# Patient Record
Sex: Male | Born: 1935 | Race: White | Hispanic: No | Smoking: Former smoker
Health system: Southern US, Community
[De-identification: ages and names within clinical notes are randomized; demographics above are authoritative.]

## PROBLEM LIST (undated history)

## (undated) DIAGNOSIS — C801 Malignant (primary) neoplasm, unspecified: Secondary | ICD-10-CM

## (undated) DIAGNOSIS — I10 Essential (primary) hypertension: Secondary | ICD-10-CM

## (undated) DIAGNOSIS — E119 Type 2 diabetes mellitus without complications: Secondary | ICD-10-CM

## (undated) DIAGNOSIS — E079 Disorder of thyroid, unspecified: Secondary | ICD-10-CM

## (undated) DIAGNOSIS — I4891 Unspecified atrial fibrillation: Secondary | ICD-10-CM

## (undated) DIAGNOSIS — K922 Gastrointestinal hemorrhage, unspecified: Secondary | ICD-10-CM

## (undated) DIAGNOSIS — I2510 Atherosclerotic heart disease of native coronary artery without angina pectoris: Secondary | ICD-10-CM

## (undated) DIAGNOSIS — J8410 Pulmonary fibrosis, unspecified: Secondary | ICD-10-CM

## (undated) HISTORY — DX: Pulmonary fibrosis, unspecified: J84.10

## (undated) HISTORY — DX: Malignant (primary) neoplasm, unspecified: C80.1

---

## 2006-04-18 ENCOUNTER — Ambulatory Visit: Payer: Self-pay

## 2006-04-19 ENCOUNTER — Ambulatory Visit: Payer: Self-pay | Admitting: Radiology

## 2016-02-28 DIAGNOSIS — G4733 Obstructive sleep apnea (adult) (pediatric): Secondary | ICD-10-CM | POA: Insufficient documentation

## 2016-02-28 DIAGNOSIS — J449 Chronic obstructive pulmonary disease, unspecified: Secondary | ICD-10-CM | POA: Insufficient documentation

## 2016-05-21 DIAGNOSIS — J449 Chronic obstructive pulmonary disease, unspecified: Secondary | ICD-10-CM | POA: Insufficient documentation

## 2016-06-26 ENCOUNTER — Inpatient Hospital Stay
Admission: EM | Admit: 2016-06-26 | Discharge: 2016-07-05 | DRG: 871 | Disposition: A | Discharge status: Skilled Nursing Facility | Payer: Medicare Other | Attending: Internal Medicine | Admitting: Internal Medicine

## 2016-06-26 ENCOUNTER — Other Ambulatory Visit: Payer: Self-pay

## 2016-06-26 ENCOUNTER — Emergency Department: Payer: Medicare Other

## 2016-06-26 ENCOUNTER — Encounter: Payer: Self-pay | Admitting: Emergency Medicine

## 2016-06-26 DIAGNOSIS — K264 Chronic or unspecified duodenal ulcer with hemorrhage: Secondary | ICD-10-CM | POA: Diagnosis present

## 2016-06-26 DIAGNOSIS — Z515 Encounter for palliative care: Secondary | ICD-10-CM | POA: Diagnosis not present

## 2016-06-26 DIAGNOSIS — I482 Chronic atrial fibrillation: Secondary | ICD-10-CM | POA: Diagnosis present

## 2016-06-26 DIAGNOSIS — J90 Pleural effusion, not elsewhere classified: Secondary | ICD-10-CM

## 2016-06-26 DIAGNOSIS — J8410 Pulmonary fibrosis, unspecified: Secondary | ICD-10-CM | POA: Diagnosis not present

## 2016-06-26 DIAGNOSIS — Z79899 Other long term (current) drug therapy: Secondary | ICD-10-CM | POA: Diagnosis not present

## 2016-06-26 DIAGNOSIS — J189 Pneumonia, unspecified organism: Secondary | ICD-10-CM

## 2016-06-26 DIAGNOSIS — Z978 Presence of other specified devices: Secondary | ICD-10-CM

## 2016-06-26 DIAGNOSIS — R627 Adult failure to thrive: Secondary | ICD-10-CM | POA: Diagnosis present

## 2016-06-26 DIAGNOSIS — Z9889 Other specified postprocedural states: Secondary | ICD-10-CM

## 2016-06-26 DIAGNOSIS — I2510 Atherosclerotic heart disease of native coronary artery without angina pectoris: Secondary | ICD-10-CM | POA: Diagnosis present

## 2016-06-26 DIAGNOSIS — D62 Acute posthemorrhagic anemia: Secondary | ICD-10-CM | POA: Diagnosis not present

## 2016-06-26 DIAGNOSIS — J470 Bronchiectasis with acute lower respiratory infection: Secondary | ICD-10-CM | POA: Diagnosis present

## 2016-06-26 DIAGNOSIS — N183 Chronic kidney disease, stage 3 (moderate): Secondary | ICD-10-CM | POA: Diagnosis present

## 2016-06-26 DIAGNOSIS — I130 Hypertensive heart and chronic kidney disease with heart failure and stage 1 through stage 4 chronic kidney disease, or unspecified chronic kidney disease: Secondary | ICD-10-CM | POA: Diagnosis present

## 2016-06-26 DIAGNOSIS — R5383 Other fatigue: Secondary | ICD-10-CM

## 2016-06-26 DIAGNOSIS — N179 Acute kidney failure, unspecified: Secondary | ICD-10-CM | POA: Diagnosis present

## 2016-06-26 DIAGNOSIS — E039 Hypothyroidism, unspecified: Secondary | ICD-10-CM | POA: Diagnosis present

## 2016-06-26 DIAGNOSIS — N189 Chronic kidney disease, unspecified: Secondary | ICD-10-CM

## 2016-06-26 DIAGNOSIS — N390 Urinary tract infection, site not specified: Secondary | ICD-10-CM | POA: Diagnosis present

## 2016-06-26 DIAGNOSIS — A419 Sepsis, unspecified organism: Secondary | ICD-10-CM | POA: Diagnosis present

## 2016-06-26 DIAGNOSIS — J439 Emphysema, unspecified: Secondary | ICD-10-CM | POA: Diagnosis present

## 2016-06-26 DIAGNOSIS — M549 Dorsalgia, unspecified: Secondary | ICD-10-CM | POA: Diagnosis present

## 2016-06-26 DIAGNOSIS — Y95 Nosocomial condition: Secondary | ICD-10-CM | POA: Diagnosis present

## 2016-06-26 DIAGNOSIS — Z452 Encounter for adjustment and management of vascular access device: Secondary | ICD-10-CM

## 2016-06-26 DIAGNOSIS — Z794 Long term (current) use of insulin: Secondary | ICD-10-CM

## 2016-06-26 DIAGNOSIS — J84112 Idiopathic pulmonary fibrosis: Secondary | ICD-10-CM | POA: Diagnosis present

## 2016-06-26 DIAGNOSIS — R569 Unspecified convulsions: Secondary | ICD-10-CM

## 2016-06-26 DIAGNOSIS — K922 Gastrointestinal hemorrhage, unspecified: Secondary | ICD-10-CM | POA: Diagnosis not present

## 2016-06-26 DIAGNOSIS — J9690 Respiratory failure, unspecified, unspecified whether with hypoxia or hypercapnia: Secondary | ICD-10-CM

## 2016-06-26 DIAGNOSIS — R6520 Severe sepsis without septic shock: Secondary | ICD-10-CM | POA: Diagnosis not present

## 2016-06-26 DIAGNOSIS — K921 Melena: Secondary | ICD-10-CM | POA: Diagnosis not present

## 2016-06-26 DIAGNOSIS — I5030 Unspecified diastolic (congestive) heart failure: Secondary | ICD-10-CM | POA: Diagnosis present

## 2016-06-26 DIAGNOSIS — E1122 Type 2 diabetes mellitus with diabetic chronic kidney disease: Secondary | ICD-10-CM | POA: Diagnosis present

## 2016-06-26 DIAGNOSIS — I480 Paroxysmal atrial fibrillation: Secondary | ICD-10-CM | POA: Diagnosis present

## 2016-06-26 DIAGNOSIS — R531 Weakness: Secondary | ICD-10-CM

## 2016-06-26 DIAGNOSIS — J209 Acute bronchitis, unspecified: Secondary | ICD-10-CM | POA: Diagnosis present

## 2016-06-26 DIAGNOSIS — D72829 Elevated white blood cell count, unspecified: Secondary | ICD-10-CM

## 2016-06-26 DIAGNOSIS — G8929 Other chronic pain: Secondary | ICD-10-CM | POA: Diagnosis present

## 2016-06-26 DIAGNOSIS — R52 Pain, unspecified: Secondary | ICD-10-CM

## 2016-06-26 DIAGNOSIS — K2970 Gastritis, unspecified, without bleeding: Secondary | ICD-10-CM | POA: Diagnosis not present

## 2016-06-26 DIAGNOSIS — A09 Infectious gastroenteritis and colitis, unspecified: Secondary | ICD-10-CM | POA: Diagnosis present

## 2016-06-26 DIAGNOSIS — Z7982 Long term (current) use of aspirin: Secondary | ICD-10-CM

## 2016-06-26 DIAGNOSIS — Z7901 Long term (current) use of anticoagulants: Secondary | ICD-10-CM

## 2016-06-26 DIAGNOSIS — Z7189 Other specified counseling: Secondary | ICD-10-CM | POA: Diagnosis not present

## 2016-06-26 DIAGNOSIS — R197 Diarrhea, unspecified: Secondary | ICD-10-CM

## 2016-06-26 DIAGNOSIS — R918 Other nonspecific abnormal finding of lung field: Secondary | ICD-10-CM

## 2016-06-26 DIAGNOSIS — R571 Hypovolemic shock: Secondary | ICD-10-CM | POA: Diagnosis not present

## 2016-06-26 DIAGNOSIS — J9621 Acute and chronic respiratory failure with hypoxia: Secondary | ICD-10-CM | POA: Diagnosis present

## 2016-06-26 DIAGNOSIS — G4733 Obstructive sleep apnea (adult) (pediatric): Secondary | ICD-10-CM | POA: Diagnosis present

## 2016-06-26 HISTORY — DX: Gastrointestinal hemorrhage, unspecified: K92.2

## 2016-06-26 HISTORY — DX: Essential (primary) hypertension: I10

## 2016-06-26 HISTORY — DX: Disorder of thyroid, unspecified: E07.9

## 2016-06-26 HISTORY — DX: Unspecified atrial fibrillation: I48.91

## 2016-06-26 HISTORY — DX: Type 2 diabetes mellitus without complications: E11.9

## 2016-06-26 HISTORY — DX: Atherosclerotic heart disease of native coronary artery without angina pectoris: I25.10

## 2016-06-26 LAB — COMPREHENSIVE METABOLIC PANEL
ALK PHOS: 58 U/L (ref 38–126)
ALT: 31 U/L (ref 17–63)
AST: 51 U/L — AB (ref 15–41)
Albumin: 2.2 g/dL — ABNORMAL LOW (ref 3.5–5.0)
Anion gap: 13 (ref 5–15)
BUN: 37 mg/dL — AB (ref 6–20)
CALCIUM: 8 mg/dL — AB (ref 8.9–10.3)
CHLORIDE: 100 mmol/L — AB (ref 101–111)
CO2: 20 mmol/L — AB (ref 22–32)
CREATININE: 2.24 mg/dL — AB (ref 0.61–1.24)
GFR calc Af Amer: 29 mL/min — ABNORMAL LOW (ref >60)
GFR, EST NON AFRICAN AMERICAN: 25 mL/min — AB (ref >60)
Glucose, Bld: 173 mg/dL — ABNORMAL HIGH (ref 65–99)
Potassium: 4.6 mmol/L (ref 3.5–5.1)
SODIUM: 133 mmol/L — AB (ref 135–145)
Total Bilirubin: 0.7 mg/dL (ref 0.3–1.2)
Total Protein: 7.5 g/dL (ref 6.5–8.1)

## 2016-06-26 LAB — CBC WITH DIFFERENTIAL/PLATELET
BASOS ABS: 0.2 10*3/uL — AB (ref 0–0.1)
BASOS PCT: 1 %
Eosinophils Absolute: 0.1 10*3/uL (ref 0–0.7)
Eosinophils Relative: 0 %
HEMATOCRIT: 35.5 % — AB (ref 40.0–52.0)
HEMOGLOBIN: 11.7 g/dL — AB (ref 13.0–18.0)
LYMPHS PCT: 13 %
Lymphs Abs: 2.9 10*3/uL (ref 1.0–3.6)
MCH: 29.2 pg (ref 26.0–34.0)
MCHC: 32.9 g/dL (ref 32.0–36.0)
MCV: 88.7 fL (ref 80.0–100.0)
MONOS PCT: 9 %
Monocytes Absolute: 2 10*3/uL — ABNORMAL HIGH (ref 0.2–1.0)
NEUTROS ABS: 17.3 10*3/uL — AB (ref 1.4–6.5)
NEUTROS PCT: 77 %
Platelets: 275 10*3/uL (ref 150–440)
RBC: 4 MIL/uL — ABNORMAL LOW (ref 4.40–5.90)
RDW: 15.5 % — ABNORMAL HIGH (ref 11.5–14.5)
WBC: 22.5 10*3/uL — ABNORMAL HIGH (ref 3.8–10.6)

## 2016-06-26 LAB — INFLUENZA PANEL BY PCR (TYPE A & B)
Influenza A By PCR: NEGATIVE
Influenza B By PCR: NEGATIVE

## 2016-06-26 LAB — LACTIC ACID, PLASMA
Lactic Acid, Venous: 3.9 mmol/L (ref 0.5–1.9)
Lactic Acid, Venous: 2.9 mmol/L (ref 0.5–1.9)

## 2016-06-26 LAB — GLUCOSE, CAPILLARY: GLUCOSE-CAPILLARY: 166 mg/dL — AB (ref 65–99)

## 2016-06-26 MED ORDER — DEXTROSE 5 % IV SOLN
2.0000 g | INTRAVENOUS | Status: AC
  Administered 2016-06-26: 2 g via INTRAVENOUS
  Filled 2016-06-26: qty 2

## 2016-06-26 MED ORDER — CARVEDILOL 6.25 MG PO TABS
6.2500 mg | ORAL_TABLET | Freq: Two times a day (BID) | ORAL | Status: DC
  Administered 2016-06-27 – 2016-07-01 (×7): 6.25 mg via ORAL
  Filled 2016-06-26 (×8): qty 1

## 2016-06-26 MED ORDER — INSULIN ASPART 100 UNIT/ML ~~LOC~~ SOLN: 0.0000 [IU] | Freq: Every day | SUBCUTANEOUS | Status: DC

## 2016-06-26 MED ORDER — METRONIDAZOLE 500 MG PO TABS
500.0000 mg | ORAL_TABLET | Freq: Three times a day (TID) | ORAL | Status: DC
  Administered 2016-06-27 (×2): 500 mg via ORAL
  Filled 2016-06-26 (×3): qty 1

## 2016-06-26 MED ORDER — HEPARIN SODIUM (PORCINE) 5000 UNIT/ML IJ SOLN
5000.0000 [IU] | Freq: Three times a day (TID) | INTRAMUSCULAR | Status: DC
  Administered 2016-06-27 (×2): 5000 [IU] via SUBCUTANEOUS
  Filled 2016-06-26 (×2): qty 1

## 2016-06-26 MED ORDER — INSULIN ASPART 100 UNIT/ML ~~LOC~~ SOLN
8.0000 [IU] | Freq: Three times a day (TID) | SUBCUTANEOUS | Status: DC
  Administered 2016-06-28 (×2): 8 [IU] via SUBCUTANEOUS
  Filled 2016-06-26 (×2): qty 8

## 2016-06-26 MED ORDER — ONDANSETRON HCL 4 MG PO TABS: 4.0000 mg | ORAL_TABLET | Freq: Four times a day (QID) | ORAL | Status: DC | PRN

## 2016-06-26 MED ORDER — VANCOMYCIN HCL IN DEXTROSE 1-5 GM/200ML-% IV SOLN
1000.0000 mg | Freq: Once | INTRAVENOUS | Status: DC
  Filled 2016-06-26: qty 200

## 2016-06-26 MED ORDER — MONTELUKAST SODIUM 10 MG PO TABS
10.0000 mg | ORAL_TABLET | Freq: Every day | ORAL | Status: DC
  Administered 2016-06-27 – 2016-06-28 (×2): 10 mg via ORAL
  Filled 2016-06-26 (×3): qty 1

## 2016-06-26 MED ORDER — LEVOTHYROXINE SODIUM 75 MCG PO TABS
75.0000 ug | ORAL_TABLET | Freq: Every day | ORAL | Status: DC
  Administered 2016-06-27 – 2016-07-05 (×9): 75 ug via ORAL
  Filled 2016-06-26 (×9): qty 1

## 2016-06-26 MED ORDER — CEFEPIME-DEXTROSE 1 GM/50ML IV SOLR: 1.0000 g | INTRAVENOUS | Status: DC

## 2016-06-26 MED ORDER — FINASTERIDE 5 MG PO TABS
5.0000 mg | ORAL_TABLET | Freq: Every day | ORAL | Status: DC
  Administered 2016-06-27 – 2016-07-05 (×9): 5 mg via ORAL
  Filled 2016-06-26 (×9): qty 1

## 2016-06-26 MED ORDER — ACETAMINOPHEN 325 MG PO TABS: 650.0000 mg | ORAL_TABLET | Freq: Four times a day (QID) | ORAL | Status: DC | PRN

## 2016-06-26 MED ORDER — SODIUM CHLORIDE 0.9 % IV SOLN
1500.0000 mg | INTRAVENOUS | Status: AC
  Administered 2016-06-26: 1500 mg via INTRAVENOUS
  Filled 2016-06-26 (×2): qty 1500

## 2016-06-26 MED ORDER — DEXTROSE 5 % IV SOLN
2.0000 g | Freq: Once | INTRAVENOUS | Status: DC
  Filled 2016-06-26: qty 2

## 2016-06-26 MED ORDER — ALBUTEROL SULFATE (2.5 MG/3ML) 0.083% IN NEBU: 2.5000 mg | INHALATION_SOLUTION | RESPIRATORY_TRACT | Status: DC | PRN

## 2016-06-26 MED ORDER — POLYETHYLENE GLYCOL 3350 17 G PO PACK: 17.0000 g | PACK | Freq: Every day | ORAL | Status: DC | PRN

## 2016-06-26 MED ORDER — SODIUM CHLORIDE 0.9 % IV BOLUS (SEPSIS)
1000.0000 mL | Freq: Once | INTRAVENOUS | Status: AC
  Administered 2016-06-26: 1000 mL via INTRAVENOUS

## 2016-06-26 MED ORDER — INSULIN ASPART 100 UNIT/ML ~~LOC~~ SOLN
0.0000 [IU] | Freq: Three times a day (TID) | SUBCUTANEOUS | Status: DC
  Administered 2016-06-27: 2 [IU] via SUBCUTANEOUS
  Administered 2016-06-28 – 2016-06-29 (×2): 3 [IU] via SUBCUTANEOUS
  Filled 2016-06-26 (×2): qty 3
  Filled 2016-06-26: qty 2

## 2016-06-26 MED ORDER — HYDROCODONE-ACETAMINOPHEN 5-325 MG PO TABS
1.0000 | ORAL_TABLET | ORAL | Status: DC | PRN
  Administered 2016-07-03 – 2016-07-05 (×4): 1 via ORAL
  Administered 2016-07-05: 2 via ORAL
  Administered 2016-07-05: 1 via ORAL
  Filled 2016-06-26 (×2): qty 1
  Filled 2016-06-26: qty 2
  Filled 2016-06-26 (×4): qty 1

## 2016-06-26 MED ORDER — ONDANSETRON HCL 4 MG/2ML IJ SOLN: 4.0000 mg | Freq: Four times a day (QID) | INTRAMUSCULAR | Status: DC | PRN

## 2016-06-26 MED ORDER — SODIUM CHLORIDE 0.9 % IV BOLUS (SEPSIS)
2500.0000 mL | Freq: Once | INTRAVENOUS | Status: AC
  Administered 2016-06-26: 2500 mL via INTRAVENOUS

## 2016-06-26 MED ORDER — ASPIRIN EC 81 MG PO TBEC
81.0000 mg | DELAYED_RELEASE_TABLET | Freq: Every day | ORAL | Status: DC
  Administered 2016-06-27 – 2016-06-28 (×2): 81 mg via ORAL
  Filled 2016-06-26 (×2): qty 1

## 2016-06-26 MED ORDER — SODIUM CHLORIDE 0.9 % IV SOLN
INTRAVENOUS | Status: AC
  Administered 2016-06-27: 01:00:00 via INTRAVENOUS

## 2016-06-26 MED ORDER — INSULIN LISPRO 100 UNIT/ML (KWIKPEN): 8.0000 [IU] | PEN_INJECTOR | Freq: Three times a day (TID) | SUBCUTANEOUS | Status: DC

## 2016-06-26 MED ORDER — SODIUM CHLORIDE 0.9% FLUSH
3.0000 mL | Freq: Two times a day (BID) | INTRAVENOUS | Status: DC
  Administered 2016-06-27 – 2016-07-04 (×17): 3 mL via INTRAVENOUS

## 2016-06-26 MED ORDER — GUAIFENESIN ER 600 MG PO TB12
1200.0000 mg | ORAL_TABLET | Freq: Two times a day (BID) | ORAL | Status: DC
  Administered 2016-06-27 – 2016-06-28 (×5): 1200 mg via ORAL
  Filled 2016-06-26 (×6): qty 2

## 2016-06-26 MED ORDER — ACETAMINOPHEN 650 MG RE SUPP: 650.0000 mg | Freq: Four times a day (QID) | RECTAL | Status: DC | PRN

## 2016-06-26 MED ORDER — ROSUVASTATIN CALCIUM 20 MG PO TABS
20.0000 mg | ORAL_TABLET | Freq: Every day | ORAL | Status: DC
  Administered 2016-06-27 – 2016-07-05 (×9): 20 mg via ORAL
  Filled 2016-06-26 (×9): qty 1

## 2016-06-26 NOTE — Progress Notes (Signed)
ANTIBIOTIC CONSULT NOTE - INITIAL  Pharmacy Consult for Vancomycin and Cefepime  Indication: Pneumonia   No Known Allergies  Patient Measurements: Height: 5\' 9"  (175.3 cm) Weight: 234 lb (106.1 kg) IBW/kg (Calculated) : 70.7 Adjusted Body Weight:   Vital Signs: Temp: 98.4 F (36.9 C) (03/05 1845) Temp Source: Oral (03/05 1845) BP: 110/99 (03/05 1845) Pulse Rate: 120 (03/05 1845) Intake/Output from previous day: No intake/output data recorded. Intake/Output from this shift: No intake/output data recorded.  Labs:  Recent Labs  06/26/16 1846  WBC 22.5*  HGB 11.7*  PLT 275  CREATININE 2.24*   Estimated Creatinine Clearance: 30 mL/min (by C-G formula based on SCr of 2.24 mg/dL (H)). No results for input(s): VANCOTROUGH, VANCOPEAK, VANCORANDOM, GENTTROUGH, GENTPEAK, GENTRANDOM, TOBRATROUGH, TOBRAPEAK, TOBRARND, AMIKACINPEAK, AMIKACINTROU, AMIKACIN in the last 72 hours.   Microbiology: No results found for this or any previous visit (from the past 720 hour(s)).  Medical History: Past Medical History:  Diagnosis Date  . Atrial fibrillation ([place])   . CAD (coronary artery disease)   . Diabetes mellitus without complication ([place])   . Hypertension   . Thyroid disease     Medications:   (Not in a hospital admission) Scheduled:   Assessment: Pharmacy consulted to dose and monitor Vancomycin and Cefepime in this 83 year old male. Currently patient's serum creatinine is 2.24 mg/dl. Last known serum creatinine was 1.5 on 1/31 @ Outside facility; therefore patient is in AKI by definition.     Goal of Therapy:  Vancomycin trough level 15-20 mcg/ml  Plan:  Will give Vancomycin 1500 mg IV x 1 and will then dose per levels until AKI resolved. Vancomycin random level ordered to be drawn @ 20:00 on 3/6 unless serum creatinine improves. Serum creatinine order placed to be drawn with am labs  Cefepime: Will give patient Cefepime 2 g IV x 1 then start Cefepime 1 g IV q24 hours  (dosing as if Crcl <49ml/min since patient is in AKI).    Darcell Sabino D 06/26/2016,7:50 PM

## 2016-06-26 NOTE — ED Triage Notes (Signed)
Patient from home via [facility]. Patient reports weakness x 3 days. Also reports diarrhea for several days. Patient states he fell in bathroom today and could not get up. Denies LOC. Denies fever at home. Denies pain. A&Ox4

## 2016-06-26 NOTE — ED Provider Notes (Signed)
[facility] Emergency Department Provider Note  ____________________________________________  Time seen: Approximately 6:54 PM  I have reviewed the triage vital signs and the nursing notes.   HISTORY  Chief Complaint Weakness and Diarrhea    HPI Glenn Reeves is a 83 y.o. male who complains of generalized weakness for 3 days. Also had diarrhea for 2 days. Today when he is using the bathroom he fell to the floor and could not get himself back up. He denies head injury or loss of consciousness. Vomiting as well. Denies pain anywhere present time.     Past Medical History:  Diagnosis Date  . Atrial fibrillation ([place])   . CAD (coronary artery disease)   . Diabetes mellitus without complication ([place])   . Hypertension   . Thyroid disease      There are no active problems to display for this patient.    History reviewed. No pertinent surgical history.   Prior to Admission medications   Medication Sig Start Date End Date Taking? Authorizing Provider  amoxicillin-clavulanate (AUGMENTIN) 875-125 MG tablet Take 1 tablet by mouth 2 (two) times daily. 06/26/16  Yes Historical Provider, MD  aspirin EC 81 MG tablet Take 1 tablet by mouth daily.   Yes Historical Provider, MD  carvedilol (COREG) 6.25 MG tablet Take 6.25 mg by mouth 2 (two) times daily. 06/13/16  Yes Historical Provider, MD  ELIQUIS 5 MG TABS tablet Take 0.5 tablets by mouth 2 (two) times daily.  06/07/16  Yes Historical Provider, MD  finasteride (PROSCAR) 5 MG tablet Take 5 mg by mouth daily. 06/07/16  Yes Historical Provider, MD  glipiZIDE (GLUCOTROL XL) 10 MG 24 hr tablet Take 10 mg by mouth daily with breakfast.   Yes Historical Provider, MD  guaiFENesin (MUCINEX) 600 MG 12 hr tablet Take 2 tablets by mouth 2 (two) times daily. 05/24/16  Yes Historical Provider, MD  insulin detemir (LEVEMIR) 100 UNIT/ML injection Inject into the skin 3 (three) times daily. Sliding scale   Yes Historical  Provider, MD  ipratropium-albuterol (DUONEB) 0.5-2.5 (3) MG/3ML SOLN Inhale 3 mLs into the lungs 2 (two) times daily.  05/25/16  Yes Historical Provider, MD  levothyroxine (SYNTHROID, LEVOTHROID) 75 MCG tablet Take 75 mcg by mouth daily. 06/07/16  Yes Historical Provider, MD  montelukast (SINGULAIR) 10 MG tablet Take 1 tablet by mouth daily. 06/07/16  Yes Historical Provider, MD  rosuvastatin (CRESTOR) 20 MG tablet Take 20 mg by mouth daily. 06/13/16  Yes Historical Provider, MD  glipiZIDE (GLUCOTROL) 5 MG tablet Take 5 mg by mouth 2 (two) times daily. 06/07/16   Historical Provider, MD  HUMALOG KWIKPEN 100 UNIT/ML KiwkPen Take 12 Units by mouth 3 (three) times daily. 06/07/16   Historical Provider, MD  lisinopril-hydrochlorothiazide (PRINZIDE,ZESTORETIC) 20-12.5 MG tablet Take 1 tablet by mouth daily.    Historical Provider, MD  polycarbophil (FIBERCON) 625 MG tablet Take 625 mg by mouth daily as needed.    Historical Provider, MD   misc med  Indications: Obstructive sleep apnea Med Name: BIPAP MASK AND SUPPLIES 1 each  0 02/23/2016  Active  Misc. Devices misc  BiPAP BiPAP Machine , 22/16 CM WATER PRESSURE at bedtime # 1 ,09/17/2008 Active Quantity: 0; Refills: 0   Conversion, PulmonarySleep ; Started 17-Sep-2008 Active   09/17/2008  Active  Misc. Devices misc  CPAP CPAP Mask , 1 (one) MASK at bedtime # 1 ,09/17/2008 Active Quantity: 0; Refills: 0   Conversion, PulmonarySleep ; Started 17-Sep-2008 Active   09/17/2008  Active  Misc. Devices misc  CPAP 30 Day Download before next follow up Quantity: 1; Refills: 0   Raval, Abhijit M.D.; Started 20-Jan-2015 Active   01/20/2015  Active  Misc. Devices misc  CPAP/BiPAP Tubing and Supplies 1 unit Quantity: 1; Refills: 3   Vela Prose M.D.; Started 31-October-2012 Active   10/31/2012  Active  apixaban (ELIQUIS) 12m tablet  Take 5 mg by mouth every morning.    02/23/2015  Active  Respiratory Therapy Supplies  (NEBULIZER/TUBING/MOUTHPIECE) kit  Nebulizer/Tubing/Mouthpiece KIT USE AS DIRECTED. Quantity: 1; Refills: 0   TVela ProseM.D.; Started 31-October-2012 Active   10/31/2012  Active  BAYER BREEZE 2 TEST disk  TEST 3 TIMES DAILY BEFORE MEALS  5 01/03/2016  Active  montelukast (generic for SINGULAIR) 153mtablet  Take 10 mg by mouth every morning.    02/11/2016  Active  misc med  Med Name:  cpap download 2 each  1 02/28/2016  Active  aspirin 8169mC tablet  Take 81 mg by mouth every morning.     Active  carvedilol (generic for COREG) 6.6m40mblet  Take 6.25 mg by mouth 2 (two) times a day with meals.     Active  finasteride (generic for PROSCAR) 5mg 58mlet  Take 5 mg by mouth every morning.     Active  glipiZIDE (generic for GLUCOTROL XL) 10mg 38m tablet  Take 5 mg by mouth 2 (two) times a day.     Active  insulin detemir (generic for LEVEMIR) 100unit/mL injection  Inject 45 units under the skin every morning.     Active  insulin lispro (generic for HumaLOG) 100unit/mL injection  Inject 12 units under the skin 3 (three) times daily after meals.     Active  levothyroxine (generic for SYNTHROID, LEVOTHROID) 75mcg 12met  Take 75 mcg by mouth before breakfast.     Active  lisinopril-hydrochlorothiazide (generic for PRINZIDE,ZESTORETIC) 20-12.5mg per58mblet  Take 1 tablet by mouth every morning.     Active  polycarbophil (FIBERCON) 66mg tab92m Take 625 mg by mouth daily as needed for constipation.     Active  rosuvastatin (CRESTOR) 20mg tabl33mTake 20 mg by mouth daily with dinner.     Active  guaiFENesin (MUCINEX) 600mg 12 hr69mlet  Take 2 tablets (1,200 mg total) by mouth 2 (two) times a day. 28 tablet  0 05/24/2016  Active  ipratropium-albuterol (generic for DUO-NEB) 0.5-2.5mg/3mL neb48mzer solution  Take 3 mL by nebulization every 4 (four) hours as needed for wheezing. Dx code CM 496 and CMJ 44-9 180 mL  11 05/25/2016   Active      Allergies Patient has no known allergies.   No family history on file.  Social History Social History  Substance Use Topics  . Smoking status: Never Smoker  . Smokeless tobacco: Never Used  . Alcohol use No    Review of Systems  Constitutional:   No fever or chills.  ENT:   No sore throat. No rhinorrhea. Cardiovascular:   No chest pain. Respiratory:   No dyspnea or cough. Gastrointestinal:   Negative for abdominal pain,Positive vomiting and diarrhea.  Genitourinary:   Negative for dysuria or difficulty urinating. Musculoskeletal:   Negative for focal pain or swelling Neurological:   Negative for headaches 10-point ROS otherwise negative.  ____________________________________________   PHYSICAL EXAM:  VITAL SIGNS: ED Triage Vitals  Enc Vitals Group     BP 06/26/16 1845 (!) 110/99     Pulse Rate 06/26/16 1845 (!) 120  Resp 06/26/16 1845 (!) 30     Temp 06/26/16 1845 98.4 F (36.9 C)     Temp Source 06/26/16 1845 Oral     SpO2 06/26/16 1845 96 %     Weight 06/26/16 1846 234 lb (106.1 kg)     Height 06/26/16 1846 _0  (1.753 m)     Head Circumference --      Peak Flow --      Pain Score --      Pain Loc --      Pain Edu? --      Excl. in [place]? --     Vital signs reviewed, nursing assessments reviewed.   Constitutional:   Alert and oriented. Not in distress. Eyes:   No scleral icterus. No conjunctival pallor. PERRL. EOMI.  No nystagmus. ENT   Head:   Normocephalic with a few scattered small abrasions over the scalp.   Nose:   No congestion/rhinnorhea. No septal hematoma   Mouth/Throat:   No mucous membranes, no pharyngeal erythema. No peritonsillar mass.    Neck:   No stridor. No SubQ emphysema. No meningismus. Hematological/Lymphatic/Immunilogical:   No cervical lymphadenopathy. Cardiovascular:   Irregularly irregular rhythm, tachycardic heart rate 120. Symmetric bilateral radial and DP pulses.  No murmurs.  Respiratory:    Normal respiratory effort without tachypnea nor retractions. Sounds symmetric. Bibasilar crackles. Gastrointestinal:   Soft and nontender. Non distended. There is no CVA tenderness.  No rebound, rigidity, or guarding. Genitourinary:   deferred Musculoskeletal:   Normal range of motion in all extremities. No joint effusions.  No lower extremity tenderness.  No edema. Neurologic:   Normal speech and language.  CN 2-10 normal. Motor grossly intact. No gross focal neurologic deficits are appreciated.  Skin:    Skin is warm, dry and intact. No rash noted.  No petechiae, purpura, or bullae.  ____________________________________________    LABS (pertinent positives/negatives) (all labs ordered are listed, but only abnormal results are displayed) Labs Reviewed  COMPREHENSIVE METABOLIC PANEL - Abnormal; Notable for the following:       Result Value   Sodium 133 (*)    Chloride 100 (*)    CO2 20 (*)    Glucose, Bld 173 (*)    BUN 37 (*)    Creatinine, Ser 2.24 (*)    Calcium 8.0 (*)    Albumin 2.2 (*)    AST 51 (*)    GFR calc non Af Amer 25 (*)    GFR calc Af Amer 29 (*)    All other components within normal limits  CBC WITH DIFFERENTIAL/PLATELET - Abnormal; Notable for the following:    WBC 22.5 (*)    RBC 4.00 (*)    Hemoglobin 11.7 (*)    HCT 35.5 (*)    RDW 15.5 (*)    Neutro Abs 17.3 (*)    Monocytes Absolute 2.0 (*)    Basophils Absolute 0.2 (*)    All other components within normal limits  URINE CULTURE  C DIFFICILE QUICK SCREEN W PCR REFLEX  CULTURE, BLOOD (ROUTINE X 2)  CULTURE, BLOOD (ROUTINE X 2)  URINALYSIS, COMPLETE (UACMP) WITH MICROSCOPIC  INFLUENZA PANEL BY PCR (TYPE A & B)  LACTIC ACID, PLASMA  LACTIC ACID, PLASMA  CREATININE, SERUM   ____________________________________________   EKG  Troponin by me Atrial fibrillation rate of 110, right axis. Normal intervals. Right bundle-branch block. No acute ischemic  changes.  ____________________________________________    RADIOLOGY  Dg Chest 2 View  Result Date:  06/26/2016 CLINICAL DATA:  Weakness for 3 days EXAM: CHEST  2 VIEW COMPARISON:  04/18/2006 FINDINGS: Fibrotic changes at the left greater than right lung bases. Tiny pleural effusions or thickening. Increased opacity at the left greater than right lung base suspicious for acute inflammatory process superimposed on chronic change. Mild cardiomegaly with atherosclerosis. No pneumothorax. Surgical clips at the left neck IMPRESSION: 1. Reticular opacities within the left greater than right bases suggestive of fibrosis. More confluent opacification at the left greater than right lung base is suspicious for superimposed acute inflammation or pneumonia. Small left pleural effusion or thickening 2. Cardiomegaly. Electronically Signed   By: Donavan Foil M.D.   On: 06/26/2016 19:10   Ct Head Wo Contrast  Result Date: 06/26/2016 CLINICAL DATA:  Weakness, status post fall, abrasions EXAM: CT HEAD WITHOUT CONTRAST TECHNIQUE: Contiguous axial images were obtained from the base of the skull through the vertex without intravenous contrast. COMPARISON:  None. FINDINGS: Patient motion degrades image quality limiting evaluation. Brain: No evidence of acute infarction, hemorrhage, extra-axial collection, ventriculomegaly, or mass effect. Generalized cerebral atrophy. Periventricular white matter low attenuation likely secondary to microangiopathy. Vascular: Cerebrovascular atherosclerotic calcifications are noted. Skull: Negative for fracture or focal lesion. Sinuses/Orbits: Visualized portions of the orbits are unremarkable. Visualized portions of the paranasal sinuses and mastoid air cells are unremarkable. Other: None. IMPRESSION: No acute intracranial pathology. Electronically Signed   By: Kathreen Devoid   On: 06/26/2016 19:27    ____________________________________________   PROCEDURES Procedures CRITICAL  CARE Performed by: Carrie Mew   Total critical care time: 35 minutes  Critical care time was exclusive of separately billable procedures and treating other patients.  Critical care was necessary to treat or prevent imminent or life-threatening deterioration.  Critical care was time spent personally by me on the following activities: development of treatment plan with patient and/or surrogate as well as nursing, discussions with consultants, evaluation of patient's response to treatment, examination of patient, obtaining history from patient or surrogate, ordering and performing treatments and interventions, ordering and review of laboratory studies, ordering and review of radiographic studies, pulse oximetry and re-evaluation of patient's condition.  ____________________________________________   INITIAL IMPRESSION / ASSESSMENT AND PLAN / ED COURSE  Pertinent labs & imaging results that were available during my care of the patient were reviewed by me and considered in my medical decision making (see chart for details).  She presents with generalized weakness vomiting diarrhea. Possible minor head trauma from the fall. Check labs flu chest x-ray CT head. Lids.     Clinical Course as of Jun 26 2021  Mon Jun 26, 2016  7673 High wbc. Acute on chronic renal insufficiency. Cont. Ivf. Check c dif. Plan to admit. Start tx for HCAP as well in case the rest of the workup is negative given infiltrate on cxr.   [PS]  1932 Most recent bp ~80/60. With wbc 22, worsened creat, cxr, will initiate code sepsis. Check lactate, blood culture, cont fluid boluses.   [PS]    Clinical Course User Index [PS] Carrie Mew, MD     ----------------------------------------- 8:21 PM on 06/26/2016 -----------------------------------------  Discussed with family who again no patient was recently treated for pneumonia as well as urinary tract infection, seemed to be getting better and then within the  last week worsened with similar symptoms that he previously presented with for pneumonia. Consistent with current working diagnosis of HCAP. Awaiting influenza and C. difficile test. Plan to admit. Continue fluid boluses and antibiotics.  ____________________________________________  FINAL CLINICAL IMPRESSION(S) / ED DIAGNOSES  Final diagnoses:  Seizure ([place])  Sepsis, due to unspecified organism ([place])  HCAP (healthcare-associated pneumonia)  Diarrhea of presumed infectious origin  Generalized weakness  Acute renal failure superimposed on chronic kidney disease, unspecified CKD stage, unspecified acute renal failure type [facility])      New Prescriptions   No medications on file     Portions of this note were generated with dragon dictation software. Dictation errors may occur despite best attempts at proofreading.    Carrie Mew, MD 06/26/16 2023

## 2016-06-26 NOTE — H&P (Signed)
[facility] NAME: Glenn Reeves    MR#:  [id number]  DATE OF BIRTH:  [birth year]  DATE OF ADMISSION:  06/26/2016  PRIMARY CARE PHYSICIAN: Pcp Not In System   REQUESTING/REFERRING PHYSICIAN: Dr. Joni Fears  CHIEF COMPLAINT:   Chief Complaint  Patient presents with  . Weakness  . Diarrhea    HISTORY OF PRESENT ILLNESS:  Glenn Reeves  is a 83 y.o. male with a known history of CAD, atrial fibrillation, hypertension, CKD stage III who was recently treated in [facility] for pneumonia presents to the emergency room due to weakness, fall and diarrhea. Patient has been found to have bibasilar pneumonia, leukocytosis, tachycardia with elevated lactic acid of 3.9. He also was treated recently for UTI. He has chronic shortness of breath which is similar. He does have sputum which is clear. Feels lightheaded and dizzy. Poor appetite. Has chronic back pain which is unchanged. Afebrile.  PAST MEDICAL HISTORY:   Past Medical History:  Diagnosis Date  . Atrial fibrillation ([place])   . CAD (coronary artery disease)   . Diabetes mellitus without complication ([place])   . GI bleed   . Hypertension   . Thyroid disease     PAST SURGICAL HISTORY:  History reviewed. No pertinent surgical history.  SOCIAL HISTORY:   Social History  Substance Use Topics  . Smoking status: Never Smoker  . Smokeless tobacco: Never Used  . Alcohol use No    FAMILY HISTORY:   Family History  Problem Relation Age of Onset  . Autoimmune disease Neg Hx     DRUG ALLERGIES:  No Known Allergies  REVIEW OF SYSTEMS:   Review of Systems  Constitutional: Positive for chills and malaise/fatigue. Negative for fever.  HENT: Negative for sore throat.   Eyes: Negative for blurred vision, double vision and pain.  Respiratory: Positive for cough, sputum production and shortness of breath. Negative for hemoptysis and wheezing.   Cardiovascular: Positive for  palpitations. Negative for chest pain, orthopnea and leg swelling.  Gastrointestinal: Positive for diarrhea. Negative for abdominal pain, constipation, heartburn, nausea and vomiting.  Genitourinary: Negative for dysuria and hematuria.  Musculoskeletal: Negative for back pain and joint pain.  Skin: Negative for rash.  Neurological: Positive for weakness. Negative for sensory change, speech change, focal weakness and headaches.  Endo/Heme/Allergies: Does not bruise/bleed easily.  Psychiatric/Behavioral: Negative for depression. The patient is not nervous/anxious.     MEDICATIONS AT HOME:   Prior to Admission medications   Medication Sig Start Date End Date Taking? Authorizing Provider  amoxicillin-clavulanate (AUGMENTIN) 875-125 MG tablet Take 1 tablet by mouth 2 (two) times daily. 06/26/16  Yes Historical Provider, MD  aspirin EC 81 MG tablet Take 1 tablet by mouth daily.   Yes Historical Provider, MD  carvedilol (COREG) 6.25 MG tablet Take 6.25 mg by mouth 2 (two) times daily. 06/13/16  Yes Historical Provider, MD  ELIQUIS 5 MG TABS tablet Take 0.5 tablets by mouth 2 (two) times daily.  06/07/16  Yes Historical Provider, MD  finasteride (PROSCAR) 5 MG tablet Take 5 mg by mouth daily. 06/07/16  Yes Historical Provider, MD  glipiZIDE (GLUCOTROL XL) 10 MG 24 hr tablet Take 10 mg by mouth daily with breakfast.   Yes Historical Provider, MD  guaiFENesin (MUCINEX) 600 MG 12 hr tablet Take 2 tablets by mouth 2 (two) times daily. 05/24/16  Yes Historical Provider, MD  insulin detemir (LEVEMIR) 100 UNIT/ML injection Inject into the skin 3 (three)  times daily. Sliding scale   Yes Historical Provider, MD  ipratropium-albuterol (DUONEB) 0.5-2.5 (3) MG/3ML SOLN Inhale 3 mLs into the lungs 2 (two) times daily.  05/25/16  Yes Historical Provider, MD  levothyroxine (SYNTHROID, LEVOTHROID) 75 MCG tablet Take 75 mcg by mouth daily. 06/07/16  Yes Historical Provider, MD  montelukast (SINGULAIR) 10 MG tablet Take 1  tablet by mouth daily. 06/07/16  Yes Historical Provider, MD  rosuvastatin (CRESTOR) 20 MG tablet Take 20 mg by mouth daily. 06/13/16  Yes Historical Provider, MD  glipiZIDE (GLUCOTROL) 5 MG tablet Take 5 mg by mouth 2 (two) times daily. 06/07/16   Historical Provider, MD  HUMALOG KWIKPEN 100 UNIT/ML KiwkPen Take 12 Units by mouth 3 (three) times daily. 06/07/16   Historical Provider, MD  lisinopril-hydrochlorothiazide (PRINZIDE,ZESTORETIC) 20-12.5 MG tablet Take 1 tablet by mouth daily.    Historical Provider, MD  polycarbophil (FIBERCON) 625 MG tablet Take 625 mg by mouth daily as needed.    Historical Provider, MD     VITAL SIGNS:  Blood pressure 113/90, pulse (!) 119, temperature 98.4 F (36.9 C), temperature source Oral, resp. rate (!) 23, height 5\' 9"  (1.753 m), weight 106.1 kg (234 lb), SpO2 94 %.  PHYSICAL EXAMINATION:  Physical Exam  GENERAL:  83 y.o.-year-old patient. Sitting in a chair EYES: Pupils equal, round, reactive to light and accommodation. No scleral icterus. Extraocular muscles intact.  HEENT: Head atraumatic, normocephalic. Oropharynx and nasopharynx clear. No oropharyngeal erythema, moist oral mucosa  NECK:  Supple, no jugular venous distention. No thyroid enlargement, no tenderness.  LUNGS: Increased work of breathing. No crackles or wheezing. CARDIOVASCULAR: Irregularly irregular. Tachycardia. ABDOMEN: Soft, nontender, nondistended. Bowel sounds present. No organomegaly or mass.  EXTREMITIES: No pedal edema, cyanosis, or clubbing. + 2 pedal & radial pulses b/l.   NEUROLOGIC: Cranial nerves II through XII are intact. No focal Motor or sensory deficits appreciated b/l PSYCHIATRIC: The patient is alert and oriented x 3. Good affect.  SKIN: No obvious rash, lesion, or ulcer.   LABORATORY PANEL:   CBC  Recent Labs Lab 06/26/16 1846  WBC 22.5*  HGB 11.7*  HCT 35.5*  PLT 275    ------------------------------------------------------------------------------------------------------------------  Chemistries   Recent Labs Lab 06/26/16 1846  NA 133*  K 4.6  CL 100*  CO2 20*  GLUCOSE 173*  BUN 37*  CREATININE 2.24*  CALCIUM 8.0*  AST 51*  ALT 31  ALKPHOS 58  BILITOT 0.7   ------------------------------------------------------------------------------------------------------------------  Cardiac Enzymes No results for input(s): TROPONINI in the last 168 hours. ------------------------------------------------------------------------------------------------------------------  RADIOLOGY:  Dg Chest 2 View  Result Date: 06/26/2016 CLINICAL DATA:  Weakness for 3 days EXAM: CHEST  2 VIEW COMPARISON:  04/18/2006 FINDINGS: Fibrotic changes at the left greater than right lung bases. Tiny pleural effusions or thickening. Increased opacity at the left greater than right lung base suspicious for acute inflammatory process superimposed on chronic change. Mild cardiomegaly with atherosclerosis. No pneumothorax. Surgical clips at the left neck IMPRESSION: 1. Reticular opacities within the left greater than right bases suggestive of fibrosis. More confluent opacification at the left greater than right lung base is suspicious for superimposed acute inflammation or pneumonia. Small left pleural effusion or thickening 2. Cardiomegaly. Electronically Signed   By: Donavan Foil M.D.   On: 06/26/2016 19:10   Ct Head Wo Contrast  Result Date: 06/26/2016 CLINICAL DATA:  Weakness, status post fall, abrasions EXAM: CT HEAD WITHOUT CONTRAST TECHNIQUE: Contiguous axial images were obtained from the base of the skull through the  vertex without intravenous contrast. COMPARISON:  None. FINDINGS: Patient motion degrades image quality limiting evaluation. Brain: No evidence of acute infarction, hemorrhage, extra-axial collection, ventriculomegaly, or mass effect. Generalized cerebral atrophy.  Periventricular white matter low attenuation likely secondary to microangiopathy. Vascular: Cerebrovascular atherosclerotic calcifications are noted. Skull: Negative for fracture or focal lesion. Sinuses/Orbits: Visualized portions of the orbits are unremarkable. Visualized portions of the paranasal sinuses and mastoid air cells are unremarkable. Other: None. IMPRESSION: No acute intracranial pathology. Electronically Signed   By: Kathreen Devoid   On: 06/26/2016 19:27     IMPRESSION AND PLAN:   * Sepsis due to bibasilar healthcare acquired pneumonia Start vancomycin and cefepime Blood cultures sent from emergency room. Sputum cultures. Elevated lactic acid. IV fluid resuscitation. Repeat lactic acid in 3 hours.  * Diarrhea Check C. difficile. We'll start oral Flagyl. Can stop if C. difficile negative  * Atrial fibrillation with rapid ventricular rate due to sepsis We'll admit to telemetry. Monitor. Should improve as sepsis improves. Continue patient's Coreg. Was on Xarelto and it was in the past but both stopped due to GI bleed. Consult [facility] cardiology has requested by patient's family.  * Hypertension Presently in the low normal range. Coreg is being continued for A. fib. Hold lisinopril and hydrochlorothiazide.  * Acute kidney injury over CKD stage III Due to sepsis. Check urinalysis. IV fluids. Monitor input and output. Repeat labs in the morning.  * CAD Patient has decided not to get any catheterization or CABG and is on medical management.  * DVT prophylaxis with heparin   All the records are reviewed and case discussed with ED provider. Management plans discussed with the patient, family and they are in agreement.  CODE STATUS: FULL  TOTAL TIME TAKING CARE OF THIS PATIENT: 40 minutes.   Hillary Bow R M.D on 06/26/2016 at 9:17 PM  Between 7am to 6pm - Pager - [phone]  After 6pm go to [email or web address] - password [email or web address] [facility]  Office   [phone]  CC: Primary care physician; Pcp Not In System  Note: This dictation was prepared with Dragon dictation along with smaller phrase technology. Any transcriptional errors that result from this process are unintentional.

## 2016-06-26 NOTE — ED Notes (Signed)
Patient moved to recliner for relief of back pain. Patient reports chronic back pain. Patient able to stand and pivot with one assist.

## 2016-06-27 LAB — URINALYSIS, COMPLETE (UACMP) WITH MICROSCOPIC
BACTERIA UA: NONE SEEN
Bilirubin Urine: NEGATIVE
GLUCOSE, UA: NEGATIVE mg/dL
KETONES UR: 5 mg/dL — AB
NITRITE: NEGATIVE
PROTEIN: 30 mg/dL — AB
Specific Gravity, Urine: 1.025 (ref 1.005–1.030)
pH: 5 (ref 5.0–8.0)

## 2016-06-27 LAB — GLUCOSE, CAPILLARY
GLUCOSE-CAPILLARY: 115 mg/dL — AB (ref 65–99)
Glucose-Capillary: 146 mg/dL — ABNORMAL HIGH (ref 65–99)
Glucose-Capillary: 122 mg/dL — ABNORMAL HIGH (ref 65–99)
Glucose-Capillary: 95 mg/dL (ref 65–99)

## 2016-06-27 LAB — CBC
HEMATOCRIT: 32.3 % — AB (ref 40.0–52.0)
HEMOGLOBIN: 10.6 g/dL — AB (ref 13.0–18.0)
MCH: 28.7 pg (ref 26.0–34.0)
MCHC: 32.8 g/dL (ref 32.0–36.0)
MCV: 87.7 fL (ref 80.0–100.0)
Platelets: 254 10*3/uL (ref 150–440)
RBC: 3.68 MIL/uL — ABNORMAL LOW (ref 4.40–5.90)
RDW: 15.6 % — AB (ref 11.5–14.5)
WBC: 22.8 10*3/uL — AB (ref 3.8–10.6)

## 2016-06-27 LAB — BASIC METABOLIC PANEL
ANION GAP: 7 (ref 5–15)
BUN: 43 mg/dL — AB (ref 6–20)
CO2: 21 mmol/L — ABNORMAL LOW (ref 22–32)
Calcium: 7.6 mg/dL — ABNORMAL LOW (ref 8.9–10.3)
Chloride: 109 mmol/L (ref 101–111)
Creatinine, Ser: 2.07 mg/dL — ABNORMAL HIGH (ref 0.61–1.24)
GFR, EST AFRICAN AMERICAN: 32 mL/min — AB (ref >60)
GFR, EST NON AFRICAN AMERICAN: 28 mL/min — AB (ref >60)
Glucose, Bld: 149 mg/dL — ABNORMAL HIGH (ref 65–99)
POTASSIUM: 4.4 mmol/L (ref 3.5–5.1)
SODIUM: 137 mmol/L (ref 135–145)

## 2016-06-27 LAB — MRSA PCR SCREENING: MRSA BY PCR: POSITIVE — AB

## 2016-06-27 LAB — C DIFFICILE QUICK SCREEN W PCR REFLEX
C Diff antigen: NEGATIVE
C Diff interpretation: NOT DETECTED
C Diff toxin: NEGATIVE

## 2016-06-27 LAB — VANCOMYCIN, RANDOM: Vancomycin Rm: 10

## 2016-06-27 MED ORDER — SODIUM CHLORIDE 0.9 % IV BOLUS (SEPSIS)
250.0000 mL | Freq: Once | INTRAVENOUS | Status: AC
  Administered 2016-06-27: 250 mL via INTRAVENOUS

## 2016-06-27 MED ORDER — ALBUTEROL SULFATE (2.5 MG/3ML) 0.083% IN NEBU: 2.5000 mg | INHALATION_SOLUTION | Freq: Four times a day (QID) | RESPIRATORY_TRACT | Status: DC | PRN

## 2016-06-27 MED ORDER — CEFEPIME-DEXTROSE 1 GM/50ML IV SOLR
1.0000 g | Freq: Every day | INTRAVENOUS | Status: DC
  Administered 2016-06-27: 1 g via INTRAVENOUS
  Filled 2016-06-27 (×2): qty 50

## 2016-06-27 MED ORDER — IPRATROPIUM-ALBUTEROL 0.5-2.5 (3) MG/3ML IN SOLN
3.0000 mL | Freq: Two times a day (BID) | RESPIRATORY_TRACT | Status: DC
  Administered 2016-06-27 – 2016-07-01 (×7): 3 mL via RESPIRATORY_TRACT
  Filled 2016-06-27 (×8): qty 3

## 2016-06-27 MED ORDER — APIXABAN 2.5 MG PO TABS: 2.5000 mg | ORAL_TABLET | Freq: Two times a day (BID) | ORAL | Status: DC

## 2016-06-27 MED ORDER — IPRATROPIUM-ALBUTEROL 0.5-2.5 (3) MG/3ML IN SOLN: 3.0000 mL | Freq: Two times a day (BID) | RESPIRATORY_TRACT | Status: DC

## 2016-06-27 MED ORDER — DEXTROSE 5 % IV SOLN
1.0000 g | INTRAVENOUS | Status: DC
  Filled 2016-06-27: qty 1

## 2016-06-27 NOTE — Consult Note (Signed)
[place]  CARDIOLOGY CONSULT NOTE  Patient ID: Glenn Reeves MRN: [id number] DOB/AGE: [birth year] 83 y.o.  Admit date: 06/26/2016 Referring Physician Dr. Posey Pronto Primary Physician   Primary Cardiologist   Reason for Consultation afib with rvr  HPI: Patient is an 83 year old male with history of known coronary artery disease, atrial fibrillation, hypertension, chronic kidney disease stage III who was recently admitted in [place] for pneumonia. He was brought to [facility] for rehabilitation. He developed worsening weakness fatigue diarrhea. He presented to the emergency room where he was noted to have bibasilar pneumonia, leukocytosis in atrial fibrillation with increased ventricular response. Patient had a serum creatinine of 2.24 with a BUN of 37 and a serum potassium 4.6. Serum creatinine appears to be approximately 1.5 at baseline by care everywhere date. EKG shows atrial fibrillation with variable ventricular response with right bundle branch block left anterior fascicular block. He shouldn't have been diagnosed with diastolic heart failure while in the rehabilitation hospital. Patient has a history of atrial fibrillation with a GI bleed while on Xarelto in 2014. He has more recently been on Eliquis and aspirin with no further GI bleed. Currently has acute on chronic renal insufficiency. Patient is a difficult historian. Chest x-ray reveals reticular opacities left greater than the right consistent with fibrosis with possible superimposed pneumonia. There is a small left lower lobe effusion with cardiomegaly.  Review of Systems  Constitutional: Positive for malaise/fatigue and weight loss.  HENT: Positive for hearing loss.   Eyes: Negative.   Respiratory: Positive for cough, shortness of breath and wheezing.   Cardiovascular: Positive for palpitations and leg swelling.  Gastrointestinal: Negative.   Genitourinary: Negative.    Musculoskeletal: Negative.   Skin: Negative.   Neurological: Positive for weakness.  Endo/Heme/Allergies: Negative.   Psychiatric/Behavioral: Negative.     Past Medical History:  Diagnosis Date  . Atrial fibrillation ([place])   . CAD (coronary artery disease)   . Diabetes mellitus without complication ([place])   . GI bleed   . Hypertension   . Thyroid disease     Family History  Problem Relation Age of Onset  . Autoimmune disease Neg Hx     Social History   Social History  . Marital status: Widowed    Spouse name: N/A  . Number of children: N/A  . Years of education: N/A   Occupational History  . Not on file.   Social History Main Topics  . Smoking status: Never Smoker  . Smokeless tobacco: Never Used  . Alcohol use No  . Drug use: No  . Sexual activity: Not on file   Other Topics Concern  . Not on file   Social History Narrative  . No narrative on file    History reviewed. No pertinent surgical history.   Prescriptions Prior to Admission  Medication Sig Dispense Refill Last Dose  . amoxicillin-clavulanate (AUGMENTIN) 875-125 MG tablet Take 1 tablet by mouth 2 (two) times daily.   06/26/2016 at Unknown time  . aspirin EC 81 MG tablet Take 1 tablet by mouth daily.   06/26/2016 at Unknown time  . carvedilol (COREG) 6.25 MG tablet Take 6.25 mg by mouth 2 (two) times daily.   06/26/2016 at Unknown time  . ELIQUIS 5 MG TABS tablet Take 0.5 tablets by mouth 2 (two) times daily.    06/26/2016 at 1500  . finasteride (PROSCAR) 5 MG tablet Take 5 mg by mouth daily.   06/26/2016 at Unknown time  .  glipiZIDE (GLUCOTROL XL) 10 MG 24 hr tablet Take 10 mg by mouth daily with breakfast.   06/26/2016 at Unknown time  . guaiFENesin (MUCINEX) 600 MG 12 hr tablet Take 2 tablets by mouth 2 (two) times daily.   prn at prn  . insulin detemir (LEVEMIR) 100 UNIT/ML injection Inject into the skin 3 (three) times daily. Sliding scale   06/26/2016 at Unknown time  . ipratropium-albuterol (DUONEB) 0.5-2.5  (3) MG/3ML SOLN Inhale 3 mLs into the lungs 2 (two) times daily.    06/26/2016 at Unknown time  . levothyroxine (SYNTHROID, LEVOTHROID) 75 MCG tablet Take 75 mcg by mouth daily.   06/26/2016 at Unknown time  . montelukast (SINGULAIR) 10 MG tablet Take 1 tablet by mouth daily.   06/26/2016 at Unknown time  . rosuvastatin (CRESTOR) 20 MG tablet Take 20 mg by mouth daily.   06/25/2016 at Unknown time  . glipiZIDE (GLUCOTROL) 5 MG tablet Take 5 mg by mouth 2 (two) times daily.     Marland Kitchen HUMALOG KWIKPEN 100 UNIT/ML KiwkPen Take 12 Units by mouth 3 (three) times daily.     Marland Kitchen lisinopril-hydrochlorothiazide (PRINZIDE,ZESTORETIC) 20-12.5 MG tablet Take 1 tablet by mouth daily.     . polycarbophil (FIBERCON) 625 MG tablet Take 625 mg by mouth daily as needed.       Physical Exam: Blood pressure (!) 93/33, pulse 85, temperature 99.1 F (37.3 C), resp. rate (!) 22, height 5\' 9"  (1.753 m), weight 98.2 kg (216 lb 8 oz), SpO2 97 %.   Wt Readings from Last 1 Encounters:  06/27/16 98.2 kg (216 lb 8 oz)     General appearance: cooperative and slowed mentation Resp: rhonchi bilaterally Chest wall: no tenderness Cardio: irregularly irregular rhythm Extremities: extremities normal, atraumatic, no cyanosis or edema Pulses: 2+ and symmetric Neurologic: Mental status: orientation: person  Labs:   Lab Results  Component Value Date   WBC 22.8 (H) 06/27/2016   HGB 10.6 (L) 06/27/2016   HCT 32.3 (L) 06/27/2016   MCV 87.7 06/27/2016   PLT 254 06/27/2016    Recent Labs Lab 06/26/16 1846 06/27/16 0338  NA 133* 137  K 4.6 4.4  CL 100* 109  CO2 20* 21*  BUN 37* 43*  CREATININE 2.24* 2.07*  CALCIUM 8.0* 7.6*  PROT 7.5  --   BILITOT 0.7  --   ALKPHOS 58  --   ALT 31  --   AST 51*  --   GLUCOSE 173* 149*   No results found for: CKTOTAL, CKMB, CKMBINDEX, TROPONINI    Radiology:Chest x-ray showed bilateral opacities consistent with fibrosis/pneumonia. EKG: Atrial fibrillation with variable ventricular  response  ASSESSMENT AND PLAN:  Patient is an 83 year old male with history of atrial fibrillation, who was admitted with complaints of altered mental status, anorexia and shortness of breath. Noted to have probable persistent pulmonary fibrosis/pneumonia. He has a history of GI bleed on Xarelto but apparently has been tolerating Eliquis at 2.5 mg twice daily and aspirin. He is on carvedilol. His rate is somewhat increased appears to be appropriate based on current clinical status. Given his worsening renal function will hold Eliquis today and follow renal function. Should he continue to improve would resume Eliquis at 2.5 mg twice daily. Should his renal function worsen will need to Coumadin from my Eliquis to Coumadin. Would continue treating his pneumonia and will follow with you Signed: Teodoro Spray MD, [facility] 06/27/2016, 1:17 PM

## 2016-06-27 NOTE — Care Management Note (Signed)
Case Management Note  Patient Details  Name: Glenn Reeves MRN: [id number] Date of Birth: [birth year]  Subjective/Objective:                  Rounded on patient to discuss discharge planning.  He is currently wearing a CPAP and resting. I introduced myself and collected patient's daughter's name which was on white board [phone] address: [address] Lorina Rabon [phone]. He has been with this only daughter for last 2.5 weeks. His address is in [facility] where he has lived alone for last 4 years. He ambulates now with at walker/cane. No Home health services in place; home exercise program in use; Spirometer. O2 acute. Has nebulizer. Has glucometer. PCP Dr. Jeanella Anton [phone] . They will agree to SNF if needed in [place]- wants [facility]. CPAP at home while sleeping.   Action/Plan: Home health list left with patient.    Need PT consult.   Expected Discharge Date:                  Expected Discharge Plan:     In-House Referral:     Discharge planning Services  CM Consult  Post Acute Care Choice:  Durable Medical Equipment, Home Health Choice offered to:  Adult Children, Patient  DME Arranged:    DME Agency:     HH Arranged:    HH Agency:     Status of Service:  In process, will continue to follow  If discussed at Long Length of Stay Meetings, dates discussed:    Additional Comments:  Marshell Garfinkel, RN 06/27/2016, 9:14 AM

## 2016-06-27 NOTE — Progress Notes (Addendum)
[place]enn [place] NAME: [place]    MR#:  [id number]  DATE OF BIRTH:  10/27/1930  SUBJECTIVE:  Poor historian Brought in for weakness  REVIEW OF SYSTEMS:   Review of Systems  Constitutional: Negative for chills, fever and weight loss.  HENT: Negative for ear discharge, ear pain and nosebleeds.   Eyes: Negative for blurred vision, pain and discharge.  Respiratory: Positive for shortness of breath. Negative for sputum production, wheezing and stridor.   Cardiovascular: Negative for chest pain, palpitations, orthopnea and PND.  Gastrointestinal: Negative for abdominal pain, diarrhea, nausea and vomiting.  Genitourinary: Negative for frequency and urgency.  Musculoskeletal: Negative for back pain and joint pain.  Neurological: Positive for weakness. Negative for sensory change, speech change and focal weakness.  Psychiatric/Behavioral: Negative for depression and hallucinations. The patient is not nervous/anxious.    Tolerating Diet:yes Tolerating PT: pending  DRUG ALLERGIES:  No Known Allergies  VITALS:  Blood pressure (!) 93/33, pulse 85, temperature 99.1 F (37.3 C), resp. rate (!) 22, height 5\' 9"  (1.753 m), weight 98.2 kg (216 lb 8 oz), SpO2 97 %.  PHYSICAL EXAMINATION:   Physical Exam  GENERAL:  83 y.o.-year-old patient lying in the bed with no acute distress. Chronically ill EYES: Pupils equal, round, reactive to light and accommodation. No scleral icterus. Extraocular muscles intact.  HEENT: Head atraumatic, normocephalic. Oropharynx and nasopharynx clear.  NECK:  Supple, no jugular venous distention. No thyroid enlargement, no tenderness.  LUNGS: distnat breath sounds bilaterally, no wheezing, rales, rhonchi. No use of accessory muscles of respiration.  CARDIOVASCULAR: S1, S2 normal. No murmurs, rubs, or gallops.  ABDOMEN: Soft, nontender, nondistended. Bowel sounds present. No organomegaly or mass.   EXTREMITIES: No cyanosis, clubbing or edema b/l.    NEUROLOGIC: Cranial nerves II through XII are intact. No focal Motor or sensory deficits b/l.   PSYCHIATRIC:  patient is alert and oriented x 3.  SKIN: No obvious rash, lesion, or ulcer.   LABORATORY PANEL:  CBC  Recent Labs Lab 06/27/16 0338  WBC 22.8*  HGB 10.6*  HCT 32.3*  PLT 254    Chemistries   Recent Labs Lab 06/26/16 1846 06/27/16 0338  NA 133* 137  K 4.6 4.4  CL 100* 109  CO2 20* 21*  GLUCOSE 173* 149*  BUN 37* 43*  CREATININE 2.24* 2.07*  CALCIUM 8.0* 7.6*  AST 51*  --   ALT 31  --   ALKPHOS 58  --   BILITOT 0.7  --    Cardiac Enzymes No results for input(s): TROPONINI in the last 168 hours. RADIOLOGY:  Dg Chest 2 View  Result Date: 06/26/2016 CLINICAL DATA:  Weakness for 3 days EXAM: CHEST  2 VIEW COMPARISON:  04/18/2006 FINDINGS: Fibrotic changes at the left greater than right lung bases. Tiny pleural effusions or thickening. Increased opacity at the left greater than right lung base suspicious for acute inflammatory process superimposed on chronic change. Mild cardiomegaly with atherosclerosis. No pneumothorax. Surgical clips at the left neck IMPRESSION: 1. Reticular opacities within the left greater than right bases suggestive of fibrosis. More confluent opacification at the left greater than right lung base is suspicious for superimposed acute inflammation or pneumonia. Small left pleural effusion or thickening 2. Cardiomegaly. Electronically Signed   By: Donavan Foil M.D.   On: 06/26/2016 19:10   Ct Head Wo Contrast  Result Date: 06/26/2016 CLINICAL DATA:  Weakness, status post fall, abrasions EXAM: CT HEAD WITHOUT  CONTRAST TECHNIQUE: Contiguous axial images were obtained from the base of the skull through the vertex without intravenous contrast. COMPARISON:  None. FINDINGS: Patient motion degrades image quality limiting evaluation. Brain: No evidence of acute infarction, hemorrhage, extra-axial  collection, ventriculomegaly, or mass effect. Generalized cerebral atrophy. Periventricular white matter low attenuation likely secondary to microangiopathy. Vascular: Cerebrovascular atherosclerotic calcifications are noted. Skull: Negative for fracture or focal lesion. Sinuses/Orbits: Visualized portions of the orbits are unremarkable. Visualized portions of the paranasal sinuses and mastoid air cells are unremarkable. Other: None. IMPRESSION: No acute intracranial pathology. Electronically Signed   By: Kathreen Devoid   On: 06/26/2016 19:27   ASSESSMENT AND PLAN:  [name]  is a 83 y.o. male with a known history of CAD, atrial fibrillation, hypertension, CKD stage III who was recently treated in [facility] for pneumonia presents to the emergency room due to weakness, fall and diarrhea. Patient has been found to have bibasilar pneumonia, leukocytosis, tachycardia with elevated lactic acid of 3.9. He also was treated recently for UTI  * Sepsis due to bibasilar healthcare acquired pneumonia and UTI Cont cefepime _MRSA PCR neg d/c Vanc C diff neg--d/c flagyl Blood cultures negative so far Elevated lactic acid. IV fluid resuscitation. Repeat lactic acid in 3 hours.  * Diarrhea Resolved c diff neg  * Atrial fibrillation with rapid ventricular rate due to sepsis We'll admit to telemetry. Monitor. Should improve as sepsis improves. Continue patient's Coreg. Was on Xarelto and it was in the past but both stopped due to GI bleed. Consult [facility] cardiology has requested by patient's family.appreciate dr Bethanne Ginger input On hold eliquis due to worsening renal function  * Hypertension Presently in the low normal range. Coreg is being continued for A. fib. Hold lisinopril and hydrochlorothiazide.  * Acute kidney injury over CKD stage III Due to sepsis. Check urinalysis. IV fluids. Monitor input and output. Repeat labs in the morning. Baseline creat 1.4-1.6 -came in with creat  2.24---2.07  * CAD Patient has decided not to get any catheterization or CABG and is on medical management.  * severe pulmonary fibrosis and Emphysema -on nebs and oxygen  * DVT prophylaxis with heparin  spoke with dter Nena Polio  Case discussed with Care Management/Social Worker. Management plans discussed with the patient, family and they are in agreement.  CODE STATUS: FULL   TOTAL TIME TAKING CARE OF THIS PATIENT: 30 minutes.  >50% time spent on counselling and coordination of care  POSSIBLE D/C IN 2-3DAYS, DEPENDING ON CLINICAL CONDITION.  Note: This dictation was prepared with Dragon dictation along with smaller phrase technology. Any transcriptional errors that result from this process are unintentional.  Kelse Ploch M.D on 06/27/2016 at 5:37 PM  Between 7am to 6pm - Pager - [phone]  After 6pm go to [email or web address] - password [email or web address] [place] [facility]  Office  [phone]  CC: Primary care physician; Pcp Not In System

## 2016-06-27 NOTE — Progress Notes (Addendum)
Patient lactic acid 2.9. MD Hugelmeyer aware. No new orders.  Patient takes eliquis 2.5 mg BID, patient daughter wanted to know why it wasn't continued here. MD Hugelmeyer to look at patients chart and decide if we will continue while here. This RN noticed that the patient has a patchy rash that is scattered throughout his body, it is mostly prominent on the right side. Per patient daughter this is a new finding. Per MD, we will monitor.   Update: Patient blood pressure 101/52, map 63. Per MD Hugelmeyer, we will give 250 bolus.   Update 0231: Patient blood pressure manually 90/48 after bolus. Prime doc paged, awaiting reply back.   Update 0254: Per MD Pyreddy, give 250 cc bolus/ hour. Will reassess after bolus is completed.

## 2016-06-28 LAB — HEMOGLOBIN A1C
HEMOGLOBIN A1C: 7.4 % — AB (ref 4.8–5.6)
Mean Plasma Glucose: 166 mg/dL

## 2016-06-28 LAB — BASIC METABOLIC PANEL
Anion gap: 10 (ref 5–15)
BUN: 44 mg/dL — ABNORMAL HIGH (ref 6–20)
CALCIUM: 7.9 mg/dL — AB (ref 8.9–10.3)
CHLORIDE: 107 mmol/L (ref 101–111)
CO2: 19 mmol/L — AB (ref 22–32)
CREATININE: 1.72 mg/dL — AB (ref 0.61–1.24)
GFR calc Af Amer: 41 mL/min — ABNORMAL LOW (ref >60)
GFR calc non Af Amer: 35 mL/min — ABNORMAL LOW (ref >60)
GLUCOSE: 99 mg/dL (ref 65–99)
Potassium: 4.3 mmol/L (ref 3.5–5.1)
Sodium: 136 mmol/L (ref 135–145)

## 2016-06-28 LAB — GLUCOSE, CAPILLARY
GLUCOSE-CAPILLARY: 141 mg/dL — AB (ref 65–99)
Glucose-Capillary: 89 mg/dL (ref 65–99)
Glucose-Capillary: 157 mg/dL — ABNORMAL HIGH (ref 65–99)
Glucose-Capillary: 54 mg/dL — ABNORMAL LOW (ref 65–99)
Glucose-Capillary: 164 mg/dL — ABNORMAL HIGH (ref 65–99)

## 2016-06-28 LAB — URINE CULTURE: Culture: NO GROWTH

## 2016-06-28 MED ORDER — APIXABAN 2.5 MG PO TABS
2.5000 mg | ORAL_TABLET | Freq: Two times a day (BID) | ORAL | Status: DC
  Administered 2016-06-28: 2.5 mg via ORAL
  Filled 2016-06-28: qty 1

## 2016-06-28 MED ORDER — CEFEPIME-DEXTROSE 1 GM/50ML IV SOLR
2.0000 g | Freq: Every day | INTRAVENOUS | Status: DC
  Filled 2016-06-28: qty 100

## 2016-06-28 MED ORDER — DEXTROSE 5 % IV SOLN
2.0000 g | INTRAVENOUS | Status: DC
  Administered 2016-06-28 – 2016-06-29 (×2): 2 g via INTRAVENOUS
  Filled 2016-06-28 (×3): qty 2

## 2016-06-28 MED ORDER — DILTIAZEM HCL 30 MG PO TABS
30.0000 mg | ORAL_TABLET | Freq: Four times a day (QID) | ORAL | Status: DC
  Administered 2016-06-28 – 2016-06-30 (×2): 30 mg via ORAL
  Filled 2016-06-28 (×5): qty 1

## 2016-06-28 NOTE — Progress Notes (Signed)
ANTIBIOTIC CONSULT NOTE - INITIAL  Pharmacy Consult for Cefepime  Indication: Pneumonia   No Known Allergies  Patient Measurements: Height: 5\' 9"  (175.3 cm) Weight: 216 lb 1.6 oz (98 kg) IBW/kg (Calculated) : 70.7  Vital Signs: Temp: 98 F (36.7 C) (03/07 1141) Temp Source: Oral (03/07 1141) BP: 104/41 (03/07 1141) Pulse Rate: 152 (03/07 1141) Intake/Output from previous day: 03/06 0701 - 03/07 0700 In: 360 [P.O.:160; I.V.:150; IV Piggyback:50] Out: 350 [Urine:350] Intake/Output from this shift: No intake/output data recorded.  Labs:  Recent Labs  06/26/16 1846 06/27/16 0338 06/28/16 0659  WBC 22.5* 22.8*  --   HGB 11.7* 10.6*  --   PLT 275 254  --   CREATININE 2.24* 2.07* 1.72*   Estimated Creatinine Clearance: 37.6 mL/min (by C-G formula based on SCr of 1.72 mg/dL (H)).  Recent Labs  06/27/16 2015  VANCORANDOM 10     Microbiology: Recent Results (from the past 720 hour(s))  C difficile quick scan w PCR reflex     Status: None   Collection Time: 06/26/16  7:23 PM  Result Value Ref Range Status   C Diff antigen NEGATIVE NEGATIVE Final   C Diff toxin NEGATIVE NEGATIVE Final   C Diff interpretation No C. difficile detected.  Final  Blood Culture (routine x 2)     Status: None (Preliminary result)   Collection Time: 06/26/16  7:50 PM  Result Value Ref Range Status   Specimen Description BLOOD L AC  Final   Special Requests BOTTLES DRAWN AEROBIC AND ANAEROBIC BCAV  Final   Culture NO GROWTH 2 DAYS  Final   Report Status PENDING  Incomplete  Blood Culture (routine x 2)     Status: None (Preliminary result)   Collection Time: 06/26/16  7:50 PM  Result Value Ref Range Status   Specimen Description BLOOD  R AC  Final   Special Requests BOTTLES DRAWN AEROBIC AND ANAEROBIC BCAV  Final   Culture NO GROWTH 2 DAYS  Final   Report Status PENDING  Incomplete  Urine culture     Status: None   Collection Time: 06/27/16  9:03 AM  Result Value Ref Range Status   Specimen Description URINE, RANDOM  Final   Special Requests NONE  Final   Culture   Final    NO GROWTH Performed at [facility], [place] [address], [place], [place] [address]    Report Status 06/28/2016 FINAL  Final  MRSA PCR Screening     Status: Abnormal   Collection Time: 06/27/16  8:36 PM  Result Value Ref Range Status   MRSA by PCR POSITIVE (A) NEGATIVE Final    Comment:        The GeneXpert MRSA Assay (FDA approved for NASAL specimens only), is one component of a comprehensive MRSA colonization surveillance program. It is not intended to diagnose MRSA infection nor to guide or monitor treatment for MRSA infections. RESULT CALLED TO, READ BACK BY AND VERIFIED WITH: TAKERA MESNICT 06/27/16 @ 2148  [place]     Medical History: Past Medical History:  Diagnosis Date  . Atrial fibrillation ([place])   . CAD (coronary artery disease)   . Diabetes mellitus without complication ([place])   . GI bleed   . Hypertension   . Thyroid disease     Medications:  Prescriptions Prior to Admission  Medication Sig Dispense Refill Last Dose  . amoxicillin-clavulanate (AUGMENTIN) 875-125 MG tablet Take 1 tablet by mouth 2 (two) times daily.   06/26/2016 at  Unknown time  . aspirin EC 81 MG tablet Take 1 tablet by mouth daily.   06/26/2016 at Unknown time  . carvedilol (COREG) 6.25 MG tablet Take 6.25 mg by mouth 2 (two) times daily.   06/26/2016 at Unknown time  . ELIQUIS 5 MG TABS tablet Take 0.5 tablets by mouth 2 (two) times daily.    06/26/2016 at 1500  . finasteride (PROSCAR) 5 MG tablet Take 5 mg by mouth daily.   06/26/2016 at Unknown time  . glipiZIDE (GLUCOTROL XL) 10 MG 24 hr tablet Take 10 mg by mouth daily with breakfast.   06/26/2016 at Unknown time  . guaiFENesin (MUCINEX) 600 MG 12 hr tablet Take 2 tablets by mouth 2 (two) times daily.   prn at prn  . insulin detemir (LEVEMIR) 100 UNIT/ML injection Inject into the skin 3 (three) times daily. Sliding scale   06/26/2016 at Unknown time  .  ipratropium-albuterol (DUONEB) 0.5-2.5 (3) MG/3ML SOLN Inhale 3 mLs into the lungs 2 (two) times daily.    06/26/2016 at Unknown time  . levothyroxine (SYNTHROID, LEVOTHROID) 75 MCG tablet Take 75 mcg by mouth daily.   06/26/2016 at Unknown time  . montelukast (SINGULAIR) 10 MG tablet Take 1 tablet by mouth daily.   06/26/2016 at Unknown time  . rosuvastatin (CRESTOR) 20 MG tablet Take 20 mg by mouth daily.   06/25/2016 at Unknown time  . glipiZIDE (GLUCOTROL) 5 MG tablet Take 5 mg by mouth 2 (two) times daily.     Marland Kitchen HUMALOG KWIKPEN 100 UNIT/ML KiwkPen Take 12 Units by mouth 3 (three) times daily.     Marland Kitchen lisinopril-hydrochlorothiazide (PRINZIDE,ZESTORETIC) 20-12.5 MG tablet Take 1 tablet by mouth daily.     . polycarbophil (FIBERCON) 625 MG tablet Take 625 mg by mouth daily as needed.      Scheduled:  . apixaban  2.5 mg Oral BID  . aspirin EC  81 mg Oral Daily  . carvedilol  6.25 mg Oral BID  . ceFEPIme  1 g Intravenous q1800  . finasteride  5 mg Oral Daily  . guaiFENesin  1,200 mg Oral BID  . insulin aspart  0-15 Units Subcutaneous TID WC  . insulin aspart  0-5 Units Subcutaneous QHS  . insulin aspart  8 Units Subcutaneous TID WC  . ipratropium-albuterol  3 mL Nebulization BID  . levothyroxine  75 mcg Oral QAC breakfast  . montelukast  10 mg Oral Daily  . rosuvastatin  20 mg Oral Daily  . sodium chloride flush  3 mL Intravenous Q12H   Assessment: Pharmacy consulted to dose and monitor Vancomycin and Cefepime in this 83 year old male. Currently patient's serum creatinine is 2.24 mg/dl. Last known serum creatinine was 1.5 on 1/31 @ Outside facility; therefore patient is in AKI by definition.     Goal of Therapy:  Vancomycin trough level 15-20 mcg/ml  Plan:  Continue cefepime 2g IV Q24hr.   Per discussion on rounds, will discontinue vancomycin and continue to monitor.   Pharmacy will continue to monitor and adjust per consult.   Tandrea Kommer L 06/28/2016,5:13 PM

## 2016-06-28 NOTE — Evaluation (Signed)
Physical Therapy Evaluation Patient Details Name: [place] MRN: [id number] DOB: July 21, 1930 Today's Date: 06/28/2016   History of Present Illness  Pt is an 83 yo male admitted [facility] due to weakness, progressive SOB, fall and diarrhea, diagnosed with sepsis with bibasilar pneumonia. PMH includes; CAD, A-fib, HTN, CKD III, Pneumonia, UTI, DM, chronic back pain and GI bleed.     Clinical Impression  Pt awake, alert and willing to participate in PT evaluation. HR remained in the 90's and low 100's throughout session and during ambulation. Pt displayed good overall strength for functional tasks and required increased time for bed mobility and transfers (min guard during transfers). Blood pressure was stable in standing at 125/77 with no complaints of being dizzy or light headed. He ambulated half way around the nursing station using a RW, min guarding and a chair follow for safety. Pt was limited by fatigue but demonstrated good safety awareness and recognized when he needed a break and was able to safely transfer to chair and was transported back to his room. O2 levels remained stable during ambulation above 94% on room air. Overall patient is limited in functional mobility due to decreased balance and poor activity tolerance secondary to cardiopulmonary impairments. He will benefit from skilled PT to correct deficits and recommend he receive HHPT following acute hospital stay.     Follow Up Recommendations Home health PT    Equipment Recommendations  None recommended by PT (pt has RW at home)    Recommendations for Other Services OT consult (Energy conservation techniques )     Precautions / Restrictions Precautions Precautions: Fall Restrictions Weight Bearing Restrictions: No      Mobility  Bed Mobility Overal bed mobility: Modified Independent             General bed mobility comments: able to move to sitting w/ increased time and use of bedrails  Transfers Overall  transfer level: Needs assistance Equipment used: Rolling walker (2 wheeled) Transfers: Sit to/from Stand Sit to Stand: Min guard         General transfer comment: increased time to transfer to standing w/ RW, heavy use of B UEs  Ambulation/Gait Ambulation/Gait assistance: Physicist, medical (Feet): 90 Feet Assistive device: Rolling walker (2 wheeled) Gait Pattern/deviations: Step-through pattern;Trunk flexed     General Gait Details: patient ambulated half way around nursing station using RW for additional stability, required a chair follow for safety and able to recognize need to sit down  Stairs            Wheelchair Mobility    Modified Rankin (Stroke Patients Only)       Balance Overall balance assessment: Needs assistance;History of Falls Sitting-balance support: No upper extremity supported;Feet supported Sitting balance-Leahy Scale: Good Sitting balance - Comments: able to maintain seated posture without back support   Standing balance support: Bilateral upper extremity supported;During functional activity Standing balance-Leahy Scale: Good Standing balance comment: Increased stability w/ RW, slightly forward flexed in standing                              Pertinent Vitals/Pain Pain Assessment: No/denies pain    Home Living Family/patient expects to be discharged to:: Private residence Living Arrangements: Children (daughter) Available Help at Discharge: Family Type of Home: House Home Access: Stairs to enter Entrance Stairs-Rails: None Entrance Stairs-Number of Steps: 1 Home Layout: Multi-level;Able to live on main level with bedroom/bathroom (pt lives on  main level) Home Equipment: Walker - 2 wheels;Cane - single point Additional Comments: states he has a RW and cane but doesn't use them, has a home in [place] but has been staying with his daughter here for the past few weeks due to medical status     Prior Function  Level of Independence: Independent with assistive device(s)               Hand Dominance   Dominant Hand: Right    Extremity/Trunk Assessment   Upper Extremity Assessment Upper Extremity Assessment: Overall WFL for tasks assessed    Lower Extremity Assessment Lower Extremity Assessment: Overall WFL for tasks assessed       Communication   Communication: HOH  Cognition Arousal/Alertness: Awake/alert Behavior During Therapy: WFL for tasks assessed/performed Overall Cognitive Status: Within Functional Limits for tasks assessed                      General Comments      Exercises     Assessment/Plan    PT Assessment Patient needs continued PT services  PT Problem List Decreased activity tolerance;Decreased balance;Decreased mobility;Decreased knowledge of use of DME;Cardiopulmonary status limiting activity       PT Treatment Interventions DME instruction;Gait training;Stair training;Functional mobility training;Therapeutic exercise;Therapeutic activities;Balance training;Patient/family education    PT Goals (Current goals can be found in the Care Plan section)  Acute Rehab PT Goals Patient Stated Goal: To get better and return home PT Goal Formulation: With patient Time For Goal Achievement: 07/12/16 Potential to Achieve Goals: Good    Frequency Min 2X/week   Barriers to discharge        Co-evaluation               End of Session Equipment Utilized During Treatment: Gait belt Activity Tolerance: Patient limited by fatigue Patient left: in chair;with call bell/phone within reach;with chair alarm set Nurse Communication: Mobility status PT Visit Diagnosis: History of falling (Z91.81);Unsteadiness on feet (R26.81)         Time: 1062-6948 PT Time Calculation (min) (ACUTE ONLY): 27 min   Charges:         PT G Codes:         Gyselle Matthew Student PT 06/28/2016, 4:09 PM

## 2016-06-28 NOTE — NC FL2 (Signed)
[place] LEVEL OF CARE SCREENING TOOL     IDENTIFICATION  Patient Name: [place] Birthdate: October 28, 1930 Sex: male Admission Date (Current Location): 06/26/2016  [place] and [place] Number:  Engineering geologist and Address:  [facility], [address], [place], [place] [address]      Provider Number: [id number]  Attending Physician Name and Address:  Max Sane, MD  Relative Name and Phone Number:  [name] Relative [phone]  [phone] or [name] Daughter   [phone]     Current Level of Care: Hospital Recommended Level of Care: [place] Prior Approval Number:    Date Approved/Denied:   PASRR Number: Pending  Discharge Plan: SNF    Current Diagnoses: Patient Active Problem List   Diagnosis Date Noted  . Sepsis ([place]) 06/26/2016    Orientation RESPIRATION BLADDER Height & Weight     Self, Place  Normal Continent Weight: 216 lb 1.6 oz (98 kg) Height:  5\' 9"  (175.3 cm)  BEHAVIORAL SYMPTOMS/MOOD NEUROLOGICAL BOWEL NUTRITION STATUS      Continent Diet (Cardiac diet)  AMBULATORY STATUS COMMUNICATION OF NEEDS Skin   Limited Assist Verbally Normal                       Personal Care Assistance Level of Assistance  Bathing, Feeding, Dressing Bathing Assistance: Limited assistance Feeding assistance: Independent Dressing Assistance: Limited assistance     Functional Limitations Info  Sight, Hearing, Speech Sight Info: Adequate Hearing Info: Adequate Speech Info: Adequate    SPECIAL CARE FACTORS FREQUENCY  PT (By licensed PT), OT (By licensed OT)     PT Frequency: minimum 5x a week OT Frequency: Minimum 5x a week            Contractures Contractures Info: Not present    Additional Factors Info  Allergies, Insulin Sliding Scale, Isolation Precautions   Allergies Info: nka   Insulin Sliding Scale Info: insulin aspart (novoLOG) injection 0-15 Units 3x a  day with meals. Isolation Precautions Info: Contact Precautions for droplet for FLU and MRSA     Current Medications (06/28/2016):  This is the current hospital active medication list Current Facility-Administered Medications  Medication Dose Route Frequency Provider Last Rate Last Dose  . acetaminophen (TYLENOL) tablet 650 mg  650 mg Oral Q6H PRN Hillary Bow, MD       Or  . acetaminophen (TYLENOL) suppository 650 mg  650 mg Rectal Q6H PRN Srikar Sudini, MD      . albuterol (PROVENTIL) (2.5 MG/3ML) 0.083% nebulizer solution 2.5 mg  2.5 mg Nebulization Q6H PRN Fritzi Mandes, MD      . aspirin EC tablet 81 mg  81 mg Oral Daily Hillary Bow, MD   81 mg at 06/28/16 0906  . carvedilol (COREG) tablet 6.25 mg  6.25 mg Oral BID Hillary Bow, MD   6.25 mg at 06/28/16 0906  . ceFEPIme (MAXIPIME) 1 GM / 57mL IVPB premix  1 g Intravenous q1800 Fritzi Mandes, MD   1 g at 06/27/16 1737  . finasteride (PROSCAR) tablet 5 mg  5 mg Oral Daily Hillary Bow, MD   5 mg at 06/28/16 0906  . guaiFENesin (MUCINEX) 12 hr tablet 1,200 mg  1,200 mg Oral BID Hillary Bow, MD   1,200 mg at 06/28/16 0906  . HYDROcodone-acetaminophen (NORCO/VICODIN) 5-325 MG per tablet 1-2 tablet  1-2 tablet Oral Q4H PRN Srikar Sudini, MD      . insulin aspart (novoLOG)  injection 0-15 Units  0-15 Units Subcutaneous TID WC Hillary Bow, MD   2 Units at 06/27/16 0800  . insulin aspart (novoLOG) injection 0-5 Units  0-5 Units Subcutaneous QHS Srikar Sudini, MD      . insulin aspart (novoLOG) injection 8 Units  8 Units Subcutaneous TID WC Hillary Bow, MD   8 Units at 06/28/16 0905  . ipratropium-albuterol (DUONEB) 0.5-2.5 (3) MG/3ML nebulizer solution 3 mL  3 mL Nebulization BID Fritzi Mandes, MD   3 mL at 06/28/16 0918  . levothyroxine (SYNTHROID, LEVOTHROID) tablet 75 mcg  75 mcg Oral QAC breakfast Hillary Bow, MD   75 mcg at 06/28/16 0906  . montelukast (SINGULAIR) tablet 10 mg  10 mg Oral Daily Hillary Bow, MD   10 mg at 06/28/16 0906  .  ondansetron (ZOFRAN) tablet 4 mg  4 mg Oral Q6H PRN Hillary Bow, MD       Or  . ondansetron (ZOFRAN) injection 4 mg  4 mg Intravenous Q6H PRN Srikar Sudini, MD      . polyethylene glycol (MIRALAX / GLYCOLAX) packet 17 g  17 g Oral Daily PRN Srikar Sudini, MD      . rosuvastatin (CRESTOR) tablet 20 mg  20 mg Oral Daily Hillary Bow, MD   20 mg at 06/28/16 0906  . sodium chloride flush (NS) 0.9 % injection 3 mL  3 mL Intravenous Q12H Srikar Sudini, MD   3 mL at 06/28/16 1000     Discharge Medications: Please see discharge summary for a list of discharge medications.  Relevant Imaging Results:  Relevant Lab Results:   Additional Information SSN [id number]  Ross Ludwig, [place]

## 2016-06-28 NOTE — Progress Notes (Signed)
[facility] [facility] [place] NAME: Glenn Reeves    MR#:  [id number]  DATE OF BIRTH:  Mar 05, 1931  SUBJECTIVE:  Feeling better, creat improving, daughter at bedside, HR up at resting REVIEW OF SYSTEMS:   Review of Systems  Constitutional: Negative for chills, fever and weight loss.  HENT: Negative for ear discharge, ear pain and nosebleeds.   Eyes: Negative for blurred vision, pain and discharge.  Respiratory: Positive for shortness of breath. Negative for sputum production, wheezing and stridor.   Cardiovascular: Negative for chest pain, palpitations, orthopnea and PND.  Gastrointestinal: Negative for abdominal pain, diarrhea, nausea and vomiting.  Genitourinary: Negative for frequency and urgency.  Musculoskeletal: Negative for back pain and joint pain.  Neurological: Positive for weakness. Negative for sensory change, speech change and focal weakness.  Psychiatric/Behavioral: Negative for depression and hallucinations. The patient is not nervous/anxious.    Tolerating Diet:yes Tolerating PT: pending  DRUG ALLERGIES:  No Known Allergies  VITALS:  Blood pressure (!) 104/41, pulse (!) 152, temperature 98 F (36.7 C), temperature source Oral, resp. rate 18, height 5\' 9"  (1.753 m), weight 98 kg (216 lb 1.6 oz), SpO2 95 %.  PHYSICAL EXAMINATION:   Physical Exam  GENERAL:  83 y.o.-year-old patient lying in the bed with no acute distress. Chronically ill EYES: Pupils equal, round, reactive to light and accommodation. No scleral icterus. Extraocular muscles intact.  HEENT: Head atraumatic, normocephalic. Oropharynx and nasopharynx clear.  NECK:  Supple, no jugular venous distention. No thyroid enlargement, no tenderness.  LUNGS: distnat breath sounds bilaterally, no wheezing, rales, rhonchi. No use of accessory muscles of respiration.  CARDIOVASCULAR: S1, S2 normal. No murmurs, rubs, or gallops.  ABDOMEN: Soft, nontender, nondistended.  Bowel sounds present. No organomegaly or mass.  EXTREMITIES: No cyanosis, clubbing or edema b/l.    NEUROLOGIC: Cranial nerves II through XII are intact. No focal Motor or sensory deficits b/l.   PSYCHIATRIC:  patient is alert and oriented x 3.  SKIN: No obvious rash, lesion, or ulcer.  LABORATORY PANEL:  CBC  Recent Labs Lab 06/27/16 0338  WBC 22.8*  HGB 10.6*  HCT 32.3*  PLT 254    Chemistries   Recent Labs Lab 06/26/16 1846  06/28/16 0659  NA 133*  < > 136  K 4.6  < > 4.3  CL 100*  < > 107  CO2 20*  < > 19*  GLUCOSE 173*  < > 99  BUN 37*  < > 44*  CREATININE 2.24*  < > 1.72*  CALCIUM 8.0*  < > 7.9*  AST 51*  --   --   ALT 31  --   --   ALKPHOS 58  --   --   BILITOT 0.7  --   --   < > = values in this interval not displayed. Cardiac Enzymes No results for input(s): TROPONINI in the last 168 hours. RADIOLOGY:  Dg Chest 2 View  Result Date: 06/26/2016 CLINICAL DATA:  Weakness for 3 days EXAM: CHEST  2 VIEW COMPARISON:  04/18/2006 FINDINGS: Fibrotic changes at the left greater than right lung bases. Tiny pleural effusions or thickening. Increased opacity at the left greater than right lung base suspicious for acute inflammatory process superimposed on chronic change. Mild cardiomegaly with atherosclerosis. No pneumothorax. Surgical clips at the left neck IMPRESSION: 1. Reticular opacities within the left greater than right bases suggestive of fibrosis. More confluent opacification at the left greater than right lung base is  suspicious for superimposed acute inflammation or pneumonia. Small left pleural effusion or thickening 2. Cardiomegaly. Electronically Signed   By: Donavan Foil M.D.   On: 06/26/2016 19:10   Ct Head Wo Contrast  Result Date: 06/26/2016 CLINICAL DATA:  Weakness, status post fall, abrasions EXAM: CT HEAD WITHOUT CONTRAST TECHNIQUE: Contiguous axial images were obtained from the base of the skull through the vertex without intravenous contrast. COMPARISON:   None. FINDINGS: Patient motion degrades image quality limiting evaluation. Brain: No evidence of acute infarction, hemorrhage, extra-axial collection, ventriculomegaly, or mass effect. Generalized cerebral atrophy. Periventricular white matter low attenuation likely secondary to microangiopathy. Vascular: Cerebrovascular atherosclerotic calcifications are noted. Skull: Negative for fracture or focal lesion. Sinuses/Orbits: Visualized portions of the orbits are unremarkable. Visualized portions of the paranasal sinuses and mastoid air cells are unremarkable. Other: None. IMPRESSION: No acute intracranial pathology. Electronically Signed   By: Kathreen Devoid   On: 06/26/2016 19:27   ASSESSMENT AND PLAN:  Glenn Reeves  is a 83 y.o. male with a known history of CAD, atrial fibrillation, hypertension, CKD stage III who was recently treated in [facility] for pneumonia presents to the emergency room due to weakness, fall and diarrhea. Patient has been found to have bibasilar pneumonia, leukocytosis, tachycardia with elevated lactic acid of 3.9. He also was treated recently for UTI  * Sepsis due to bibasilar healthcare acquired pneumonia and UTI Cont cefepime Blood cultures negative so far  * Diarrhea Resolved c diff neg  * Atrial fibrillation with rapid ventricular rate due to sepsis - Continue Coreg. Add cardizem for better HR control - restart eliquis das renal function improving  * Hypertension Presently in the low normal range. Coreg is being continued for A. fib. Hold lisinopril and hydrochlorothiazide.  * Acute kidney injury over CKD stage III Due to sepsis. Check urinalysis. IV fluids. Monitor input and output. Repeat labs in the morning. Baseline creat 1.4-1.6 -came in with creat 2.24---2.07-> 1.72  * CAD Patient has decided not to get any catheterization or CABG and is on medical management.  * severe pulmonary fibrosis and Emphysema -on nebs and oxygen  * DVT  prophylaxis with heparin  spoke with dter Nena Polio at bedside  Case discussed with Care Management/Social Worker. Management plans discussed with the patient, family and they are in agreement.  CODE STATUS: FULL   TOTAL TIME TAKING CARE OF THIS PATIENT: 30 minutes.  >50% time spent on counselling and coordination of care  POSSIBLE D/C IN 2-3 DAYS, DEPENDING ON CLINICAL CONDITION.  Note: This dictation was prepared with Dragon dictation along with smaller phrase technology. Any transcriptional errors that result from this process are unintentional.  Max Sane M.D on 06/28/2016 at 5:20 PM  Between 7am to 6pm - Pager - [phone]  After 6pm go to [email or web address] - password [email or web address] [facility]  Office  [phone]  CC: Primary care physician; Pcp Not In System

## 2016-06-29 ENCOUNTER — Inpatient Hospital Stay: Payer: Medicare Other

## 2016-06-29 ENCOUNTER — Encounter: Payer: Self-pay | Admitting: Certified Registered Nurse Anesthetist

## 2016-06-29 ENCOUNTER — Encounter
Admission: EM | Disposition: A | Discharge status: Skilled Nursing Facility | Payer: Self-pay | Source: Home / Self Care | Attending: Pulmonary Disease

## 2016-06-29 DIAGNOSIS — K921 Melena: Secondary | ICD-10-CM

## 2016-06-29 DIAGNOSIS — K264 Chronic or unspecified duodenal ulcer with hemorrhage: Secondary | ICD-10-CM

## 2016-06-29 DIAGNOSIS — R571 Hypovolemic shock: Secondary | ICD-10-CM

## 2016-06-29 DIAGNOSIS — J8410 Pulmonary fibrosis, unspecified: Secondary | ICD-10-CM

## 2016-06-29 DIAGNOSIS — I482 Chronic atrial fibrillation: Secondary | ICD-10-CM

## 2016-06-29 DIAGNOSIS — J90 Pleural effusion, not elsewhere classified: Secondary | ICD-10-CM

## 2016-06-29 DIAGNOSIS — K922 Gastrointestinal hemorrhage, unspecified: Secondary | ICD-10-CM

## 2016-06-29 DIAGNOSIS — K2970 Gastritis, unspecified, without bleeding: Secondary | ICD-10-CM

## 2016-06-29 HISTORY — PX: ESOPHAGOGASTRODUODENOSCOPY (EGD) WITH PROPOFOL: SHX5813

## 2016-06-29 LAB — CBC WITH DIFFERENTIAL/PLATELET
Basophils Absolute: 0 10*3/uL (ref 0–0.1)
Basophils Relative: 0 %
EOS ABS: 0.1 10*3/uL (ref 0–0.7)
Eosinophils Relative: 0 %
HEMATOCRIT: 23 % — AB (ref 40.0–52.0)
HEMOGLOBIN: 7.7 g/dL — AB (ref 13.0–18.0)
Lymphocytes Relative: 17 %
Lymphs Abs: 3.6 10*3/uL (ref 1.0–3.6)
MCH: 29.7 pg (ref 26.0–34.0)
MCHC: 33.6 g/dL (ref 32.0–36.0)
MCV: 88.5 fL (ref 80.0–100.0)
MONOS PCT: 8 %
Monocytes Absolute: 1.7 10*3/uL — ABNORMAL HIGH (ref 0.2–1.0)
NEUTROS ABS: 15.6 10*3/uL — AB (ref 1.4–6.5)
NEUTROS PCT: 75 %
Platelets: 320 10*3/uL (ref 150–440)
RBC: 2.6 MIL/uL — AB (ref 4.40–5.90)
RDW: 15.8 % — ABNORMAL HIGH (ref 11.5–14.5)
WBC: 21 10*3/uL — AB (ref 3.8–10.6)

## 2016-06-29 LAB — BLOOD GAS, ARTERIAL
Acid-base deficit: 7.6 mmol/L — ABNORMAL HIGH (ref 0.0–2.0)
Bicarbonate: 18.3 mmol/L — ABNORMAL LOW (ref 20.0–28.0)
FIO2: 0.28
LHR: 16 {breaths}/min
MECHVT: 500 mL
O2 SAT: 96.8 %
PCO2 ART: 38 mmHg (ref 32.0–48.0)
PEEP: 5 cmH2O
PO2 ART: 98 mmHg (ref 83.0–108.0)
Patient temperature: 37
pH, Arterial: 7.29 — ABNORMAL LOW (ref 7.350–7.450)

## 2016-06-29 LAB — BASIC METABOLIC PANEL
ANION GAP: 12 (ref 5–15)
BUN: 57 mg/dL — ABNORMAL HIGH (ref 6–20)
CALCIUM: 7.4 mg/dL — AB (ref 8.9–10.3)
CO2: 11 mmol/L — AB (ref 22–32)
Chloride: 108 mmol/L (ref 101–111)
Creatinine, Ser: 2 mg/dL — ABNORMAL HIGH (ref 0.61–1.24)
GFR calc Af Amer: 34 mL/min — ABNORMAL LOW (ref >60)
GFR calc non Af Amer: 29 mL/min — ABNORMAL LOW (ref >60)
GLUCOSE: 208 mg/dL — AB (ref 65–99)
Potassium: 5.2 mmol/L — ABNORMAL HIGH (ref 3.5–5.1)
Sodium: 131 mmol/L — ABNORMAL LOW (ref 135–145)

## 2016-06-29 LAB — GLUCOSE, CAPILLARY
GLUCOSE-CAPILLARY: 124 mg/dL — AB (ref 65–99)
GLUCOSE-CAPILLARY: 131 mg/dL — AB (ref 65–99)
GLUCOSE-CAPILLARY: 161 mg/dL — AB (ref 65–99)
GLUCOSE-CAPILLARY: 196 mg/dL — AB (ref 65–99)
Glucose-Capillary: 196 mg/dL — ABNORMAL HIGH (ref 65–99)
Glucose-Capillary: 150 mg/dL — ABNORMAL HIGH (ref 65–99)

## 2016-06-29 LAB — PROCALCITONIN: PROCALCITONIN: 0.83 ng/mL

## 2016-06-29 LAB — CBC
HEMATOCRIT: 25 % — AB (ref 40.0–52.0)
Hemoglobin: 7.9 g/dL — ABNORMAL LOW (ref 13.0–18.0)
MCH: 27.8 pg (ref 26.0–34.0)
MCHC: 31.6 g/dL — AB (ref 32.0–36.0)
MCV: 87.9 fL (ref 80.0–100.0)
Platelets: 250 10*3/uL (ref 150–440)
RBC: 2.84 MIL/uL — ABNORMAL LOW (ref 4.40–5.90)
RDW: 16 % — AB (ref 11.5–14.5)
WBC: 17.7 10*3/uL — AB (ref 3.8–10.6)

## 2016-06-29 LAB — OCCULT BLOOD X 1 CARD TO LAB, STOOL: FECAL OCCULT BLD: POSITIVE — AB

## 2016-06-29 SURGERY — ESOPHAGOGASTRODUODENOSCOPY (EGD) WITH PROPOFOL
Anesthesia: General

## 2016-06-29 MED ORDER — MIDAZOLAM HCL 2 MG/2ML IJ SOLN
INTRAMUSCULAR | Status: AC
  Administered 2016-06-29: 2 mg via INTRAVENOUS
  Filled 2016-06-29: qty 4

## 2016-06-29 MED ORDER — SODIUM CHLORIDE 0.9 % IV SOLN
8.0000 mg/h | INTRAVENOUS | Status: AC
  Administered 2016-06-29 – 2016-07-01 (×7): 8 mg/h via INTRAVENOUS
  Filled 2016-06-29 (×7): qty 80

## 2016-06-29 MED ORDER — SODIUM CHLORIDE 0.9 % IV SOLN
INTRAVENOUS | Status: AC
  Administered 2016-06-29 (×3): via INTRAVENOUS

## 2016-06-29 MED ORDER — SODIUM CHLORIDE 0.9 % IV SOLN
80.0000 mg | Freq: Once | INTRAVENOUS | Status: AC
  Administered 2016-06-29: 04:00:00 80 mg via INTRAVENOUS
  Filled 2016-06-29: qty 80

## 2016-06-29 MED ORDER — MUPIROCIN 2 % EX OINT
1.0000 "application " | TOPICAL_OINTMENT | Freq: Two times a day (BID) | CUTANEOUS | Status: AC
  Administered 2016-06-29 – 2016-07-03 (×10): 1 via NASAL
  Filled 2016-06-29 (×2): qty 22

## 2016-06-29 MED ORDER — SODIUM CHLORIDE 0.9% FLUSH
10.0000 mL | Freq: Two times a day (BID) | INTRAVENOUS | Status: DC
  Administered 2016-06-29 – 2016-06-30 (×3): 10 mL
  Administered 2016-07-01: 20 mL
  Administered 2016-07-01 – 2016-07-04 (×6): 10 mL

## 2016-06-29 MED ORDER — MIDAZOLAM HCL 2 MG/2ML IJ SOLN
1.0000 mg | INTRAMUSCULAR | Status: DC | PRN
  Administered 2016-06-29: 1 mg via INTRAVENOUS
  Filled 2016-06-29: qty 2

## 2016-06-29 MED ORDER — FENTANYL BOLUS VIA INFUSION
25.0000 ug | INTRAVENOUS | Status: DC | PRN
  Filled 2016-06-29: qty 25

## 2016-06-29 MED ORDER — PANTOPRAZOLE SODIUM 40 MG IV SOLR: 40.0000 mg | Freq: Two times a day (BID) | INTRAVENOUS | Status: DC

## 2016-06-29 MED ORDER — ROCURONIUM BROMIDE 50 MG/5ML IV SOLN
INTRAVENOUS | Status: AC
  Administered 2016-06-29: 50 mg via INTRAVENOUS
  Filled 2016-06-29: qty 1

## 2016-06-29 MED ORDER — CHLORHEXIDINE GLUCONATE CLOTH 2 % EX PADS
6.0000 | MEDICATED_PAD | Freq: Every day | CUTANEOUS | Status: AC
  Administered 2016-06-29 – 2016-07-03 (×3): 6 via TOPICAL

## 2016-06-29 MED ORDER — FENTANYL CITRATE (PF) 100 MCG/2ML IJ SOLN
200.0000 ug | Freq: Once | INTRAMUSCULAR | Status: AC
  Administered 2016-06-29: 200 ug via INTRAVENOUS

## 2016-06-29 MED ORDER — FENTANYL CITRATE (PF) 100 MCG/2ML IJ SOLN
50.0000 ug | Freq: Once | INTRAMUSCULAR | Status: AC
  Administered 2016-06-29: 50 ug via INTRAVENOUS

## 2016-06-29 MED ORDER — SODIUM CHLORIDE 0.9% FLUSH: 10.0000 mL | INTRAVENOUS | Status: DC | PRN

## 2016-06-29 MED ORDER — FENTANYL CITRATE (PF) 100 MCG/2ML IJ SOLN
INTRAMUSCULAR | Status: AC
  Administered 2016-06-29: 200 ug via INTRAVENOUS
  Filled 2016-06-29: qty 4

## 2016-06-29 MED ORDER — INSULIN ASPART 100 UNIT/ML ~~LOC~~ SOLN
0.0000 [IU] | SUBCUTANEOUS | Status: DC
  Administered 2016-06-29: 3 [IU] via SUBCUTANEOUS
  Administered 2016-06-29 (×2): 2 [IU] via SUBCUTANEOUS
  Filled 2016-06-29: qty 3
  Filled 2016-06-29 (×2): qty 2

## 2016-06-29 MED ORDER — MIDAZOLAM HCL 2 MG/2ML IJ SOLN
1.0000 mg | Freq: Once | INTRAMUSCULAR | Status: AC
  Administered 2016-06-29: 1 mg via INTRAVENOUS

## 2016-06-29 MED ORDER — SODIUM CHLORIDE 0.9 % IV SOLN: INTRAVENOUS | Status: DC

## 2016-06-29 MED ORDER — MIDAZOLAM HCL 2 MG/2ML IJ SOLN
1.0000 mg | INTRAMUSCULAR | Status: AC | PRN
  Administered 2016-06-29 (×3): 1 mg via INTRAVENOUS
  Filled 2016-06-29: qty 2

## 2016-06-29 MED ORDER — SODIUM CHLORIDE 0.9 % IV SOLN
0.0000 ug/min | INTRAVENOUS | Status: DC
  Administered 2016-06-29 (×2): 110 ug/min via INTRAVENOUS
  Administered 2016-06-30: 66 ug/min via INTRAVENOUS
  Administered 2016-06-30: 76 ug/min via INTRAVENOUS
  Filled 2016-06-29 (×5): qty 4

## 2016-06-29 MED ORDER — EPINEPHRINE PF 1 MG/ML IJ SOLN
INTRAMUSCULAR | Status: DC | PRN
  Administered 2016-06-29: 4.5 mL

## 2016-06-29 MED ORDER — FENTANYL 2500MCG IN NS 250ML (10MCG/ML) PREMIX INFUSION
25.0000 ug/h | INTRAVENOUS | Status: DC
  Administered 2016-06-29: 50 ug/h via INTRAVENOUS
  Filled 2016-06-29: qty 250

## 2016-06-29 MED ORDER — PHENYLEPHRINE HCL 10 MG/ML IJ SOLN
0.0000 ug/min | INTRAMUSCULAR | Status: DC
  Administered 2016-06-29: 20 ug/min via INTRAVENOUS
  Filled 2016-06-29: qty 1

## 2016-06-29 MED ORDER — ALBUTEROL SULFATE (2.5 MG/3ML) 0.083% IN NEBU: 2.5000 mg | INHALATION_SOLUTION | RESPIRATORY_TRACT | Status: DC | PRN

## 2016-06-29 MED ORDER — SODIUM CHLORIDE 0.9 % IV BOLUS (SEPSIS)
1000.0000 mL | Freq: Once | INTRAVENOUS | Status: AC
  Administered 2016-06-29: 1000 mL via INTRAVENOUS

## 2016-06-29 MED ORDER — MIDAZOLAM HCL 2 MG/2ML IJ SOLN
2.0000 mg | Freq: Once | INTRAMUSCULAR | Status: AC
  Administered 2016-06-29: 2 mg via INTRAVENOUS

## 2016-06-29 MED ORDER — ROCURONIUM BROMIDE 50 MG/5ML IV SOLN
50.0000 mg | Freq: Once | INTRAVENOUS | Status: AC
Start: 2016-06-29 — End: 2016-06-29
  Administered 2016-06-29: 50 mg via INTRAVENOUS

## 2016-06-29 MED ORDER — DOCUSATE SODIUM 50 MG/5ML PO LIQD
100.0000 mg | Freq: Two times a day (BID) | ORAL | Status: DC | PRN
  Filled 2016-06-29: qty 10

## 2016-06-29 NOTE — Progress Notes (Signed)
Dr. Durwin Reges at bedside for upper endoscopy.

## 2016-06-29 NOTE — Progress Notes (Signed)
OT Cancellation Note  Patient Details Name: Glenn Reeves MRN: [id number] DOB: Jun 11, 1930   Cancelled Treatment:    Reason Eval/Treat Not Completed: Other (comment). Order received, chart reviewed. Pt noted to have been transferred to CCU following rapid response call. Per policy, pt will need new OT orders to begin therapy due to change in pt status. Will complete order.  Jeni Salles, MPH, MS, OTR/L ascom [phone] 06/29/16, 7:56 AM

## 2016-06-29 NOTE — Progress Notes (Signed)
PT Cancellation Note  Patient Details Name: Glenn Reeves MRN: [id number] DOB: June 20, 1930   Cancelled Treatment:    Reason Eval/Treat Not Completed: Medical issues which prohibited therapy (Chart reviewed, noted patient's change in medical status overnight requiring transfer to ICU, per hospital policy we will need new PT orders to continue working with patient)   Orlene Och Student PT 06/29/2016, 8:38 AM

## 2016-06-29 NOTE — Progress Notes (Signed)
eLink Physician-Brief Progress Note Patient Name: Glenn Reeves DOB: October 25, 1930 MRN: [id number]   Date of Service  06/29/2016  HPI/Events of Note  [place] check on patient postextubation. Sleeping comfortably. No distress.   eICU Interventions  Continue ICU monitoring.      Intervention Category Major Interventions: Respiratory failure - evaluation and management  Tera Partridge 06/29/2016, 10:50 PM

## 2016-06-29 NOTE — Progress Notes (Addendum)
Patient called with need to use bedpan.  Patient with small maroon stool.  Vital signs stable.  Order obtained from Dana,NP to guaic stool.

## 2016-06-29 NOTE — Progress Notes (Addendum)
Left IJ triple lumen placed by Nurse practioner, line documented in flowsheets. Marda Stalker cleared central line for use.

## 2016-06-29 NOTE — Procedures (Signed)
Endotracheal Intubation Procedure Note  Indication for endotracheal intubation: impending respiratory failure and prior to upper endoscopy. Airway Assessment: Mallampati Class: II (hard and soft palate, upper portion of tonsils anduvula visible). Sedation: fentanyl and midazolam. Paralytic: rocuronium. Lidocaine: no. Atropine: no. Equipment: glidoscope utilized during procedure . Cricoid Pressure: no. Number of attempts: 1. ETT location confirmed by by auscultation, by CXR and ETCO2 monitor.  Pt intubated by Darel Hong, Acute Care Nurse Practitioner Student under direct supervision of Dr. Ashby Dawes.   Marda Stalker, [place] Pager [phone] (please enter 7 digits) [place] Pager [phone] (please enter 7 digits)  Merton Border, MD PCCM service Mobile [phone] Pager [phone] 06/30/2016

## 2016-06-29 NOTE — Progress Notes (Signed)
Patient transported to CT with RN present via bed.

## 2016-06-29 NOTE — Procedures (Signed)
Central Venous Catheter Insertion Procedure Note Glenn Reeves [id number] 02-24-1931  Procedure: Insertion of Central Venous Catheter Indications: Assessment of intravascular volume, Drug and/or fluid administration and Frequent blood sampling  Procedure Details Consent: Risks of procedure as well as the alternatives and risks of each were explained to the (patient/caregiver).  Consent for procedure obtained. Time Out: Verified patient identification, verified procedure, site/side was marked, verified correct patient position, special equipment/implants available, medications/allergies/relevent history reviewed, required imaging and test results available.  Performed  Maximum sterile technique was used including antiseptics, cap, gloves, gown, hand hygiene, mask and sheet. Skin prep: Chlorhexidine; local anesthetic administered A antimicrobial bonded/coated triple lumen catheter was placed in the left internal jugular vein using the Seldinger technique.  Evaluation Blood flow good Complications: No apparent complications Patient did not tolerate procedure well. Chest X-ray ordered to verify placement.  CXR: normal.  Left internal jugular central line placed utilizing ultrasound no complications present during or following procedure.  Marda Stalker, [place] Pager [phone] (please enter 7 digits) [place] Pager [phone] (please enter 7 digits)   Merton Border, MD PCCM service Mobile [phone] Pager [phone] 06/30/2016

## 2016-06-29 NOTE — Progress Notes (Signed)
Pt extubated to roomair without complication, sats 70%, respiratory rate 18/min, no stridor noted, will continue to monitor

## 2016-06-29 NOTE — Progress Notes (Signed)
Patient restless and agitated. Verbal order for versed push obtained from Hinton Dyer, NP

## 2016-06-29 NOTE — Progress Notes (Addendum)
Family requesting update about patient being extubated post upper endoscopy.  Dana at bedside updating family.

## 2016-06-29 NOTE — Progress Notes (Signed)
Patient placed in spontaneous mode for vent wean.  Fentanyl drip turned off.

## 2016-06-29 NOTE — Progress Notes (Signed)
ANTIBIOTIC CONSULT NOTE - INITIAL  Pharmacy Consult for Cefepime  Indication: Pneumonia   No Known Allergies  Patient Measurements: Height: 5\' 9"  (175.3 cm) Weight: 220 lb 3.8 oz (99.9 kg) IBW/kg (Calculated) : 70.7  Vital Signs: Temp: 98.1 F (36.7 C) (03/08 0930) Temp Source: Oral (03/08 0308) BP: 91/53 (03/08 1100) Pulse Rate: 96 (03/08 1100) Intake/Output from previous day: 03/07 0701 - 03/08 0700 In: -  Out: 175 [Urine:175] Intake/Output from this shift: Total I/O In: 1269.6 [I.V.:1269.6] Out: -   Labs:  Recent Labs  06/26/16 1846 06/27/16 0338 06/28/16 0659 06/29/16 0413  WBC 22.5* 22.8*  --  17.7*  HGB 11.7* 10.6*  --  7.9*  PLT 275 254  --  250  CREATININE 2.24* 2.07* 1.72* 2.00*   Estimated Creatinine Clearance: 32.6 mL/min (by C-G formula based on SCr of 2 mg/dL (H)).  Recent Labs  06/27/16 2015  VANCORANDOM 10     Microbiology: Recent Results (from the past 720 hour(s))  C difficile quick scan w PCR reflex     Status: None   Collection Time: 06/26/16  7:23 PM  Result Value Ref Range Status   C Diff antigen NEGATIVE NEGATIVE Final   C Diff toxin NEGATIVE NEGATIVE Final   C Diff interpretation No C. difficile detected.  Final  Blood Culture (routine x 2)     Status: None (Preliminary result)   Collection Time: 06/26/16  7:50 PM  Result Value Ref Range Status   Specimen Description BLOOD L AC  Final   Special Requests BOTTLES DRAWN AEROBIC AND ANAEROBIC BCAV  Final   Culture NO GROWTH 3 DAYS  Final   Report Status PENDING  Incomplete  Blood Culture (routine x 2)     Status: None (Preliminary result)   Collection Time: 06/26/16  7:50 PM  Result Value Ref Range Status   Specimen Description BLOOD  R AC  Final   Special Requests BOTTLES DRAWN AEROBIC AND ANAEROBIC BCAV  Final   Culture NO GROWTH 3 DAYS  Final   Report Status PENDING  Incomplete  Urine culture     Status: None   Collection Time: 06/27/16  9:03 AM  Result Value Ref Range  Status   Specimen Description URINE, RANDOM  Final   Special Requests NONE  Final   Culture   Final    NO GROWTH Performed at [facility], [place] [address], [place], [place] [address]    Report Status 06/28/2016 FINAL  Final  MRSA PCR Screening     Status: Abnormal   Collection Time: 06/27/16  8:36 PM  Result Value Ref Range Status   MRSA by PCR POSITIVE (A) NEGATIVE Final    Comment:        The GeneXpert MRSA Assay (FDA approved for NASAL specimens only), is one component of a comprehensive MRSA colonization surveillance program. It is not intended to diagnose MRSA infection nor to guide or monitor treatment for MRSA infections. RESULT CALLED TO, READ BACK BY AND VERIFIED WITH: TAKERA MESNICT 06/27/16 @ 2148  [place]     Medical History: Past Medical History:  Diagnosis Date  . Atrial fibrillation ([place])   . CAD (coronary artery disease)   . Diabetes mellitus without complication ([place])   . GI bleed   . Hypertension   . Thyroid disease     Medications:  Prescriptions Prior to Admission  Medication Sig Dispense Refill Last Dose  . amoxicillin-clavulanate (AUGMENTIN) 875-125 MG tablet Take 1 tablet by mouth  2 (two) times daily.   06/26/2016 at Unknown time  . aspirin EC 81 MG tablet Take 1 tablet by mouth daily.   06/26/2016 at Unknown time  . carvedilol (COREG) 6.25 MG tablet Take 6.25 mg by mouth 2 (two) times daily.   06/26/2016 at Unknown time  . ELIQUIS 5 MG TABS tablet Take 0.5 tablets by mouth 2 (two) times daily.    06/26/2016 at 1500  . finasteride (PROSCAR) 5 MG tablet Take 5 mg by mouth daily.   06/26/2016 at Unknown time  . glipiZIDE (GLUCOTROL XL) 10 MG 24 hr tablet Take 10 mg by mouth daily with breakfast.   06/26/2016 at Unknown time  . guaiFENesin (MUCINEX) 600 MG 12 hr tablet Take 2 tablets by mouth 2 (two) times daily.   prn at prn  . insulin detemir (LEVEMIR) 100 UNIT/ML injection Inject into the skin 3 (three) times daily. Sliding scale   06/26/2016 at Unknown time   . ipratropium-albuterol (DUONEB) 0.5-2.5 (3) MG/3ML SOLN Inhale 3 mLs into the lungs 2 (two) times daily.    06/26/2016 at Unknown time  . levothyroxine (SYNTHROID, LEVOTHROID) 75 MCG tablet Take 75 mcg by mouth daily.   06/26/2016 at Unknown time  . montelukast (SINGULAIR) 10 MG tablet Take 1 tablet by mouth daily.   06/26/2016 at Unknown time  . rosuvastatin (CRESTOR) 20 MG tablet Take 20 mg by mouth daily.   06/25/2016 at Unknown time  . glipiZIDE (GLUCOTROL) 5 MG tablet Take 5 mg by mouth 2 (two) times daily.     Marland Kitchen HUMALOG KWIKPEN 100 UNIT/ML KiwkPen Take 12 Units by mouth 3 (three) times daily.     Marland Kitchen lisinopril-hydrochlorothiazide (PRINZIDE,ZESTORETIC) 20-12.5 MG tablet Take 1 tablet by mouth daily.     . polycarbophil (FIBERCON) 625 MG tablet Take 625 mg by mouth daily as needed.      Scheduled:  . fentaNYL      . midazolam      . rocuronium      . carvedilol  6.25 mg Oral BID  . ceFEPIme (MAXIPIME) 2 GM IVP  2 g Intravenous Q24H  . Chlorhexidine Gluconate Cloth  6 each Topical Q0600  . diltiazem  30 mg Oral Q6H  . fentaNYL (SUBLIMAZE) injection  50 mcg Intravenous Once  . finasteride  5 mg Oral Daily  . insulin aspart  0-15 Units Subcutaneous Q4H  . ipratropium-albuterol  3 mL Nebulization BID  . levothyroxine  75 mcg Oral QAC breakfast  . mupirocin ointment  1 application Nasal BID  . rosuvastatin  20 mg Oral Daily  . sodium chloride flush  3 mL Intravenous Q12H   Assessment: 83 y/o M with a h/o CAD, AF, CKD with recent treatment for PNA. Pharmacy consulted to dose cefepime.   Plan:  Continue cefepime 2g IV Q24hr.   Pharmacy will continue to monitor and adjust per consult.   Glenn Reeves 06/29/2016,1:57 PM

## 2016-06-29 NOTE — Consult Note (Signed)
[facility]  SUBJECTIVE: Events of last pm noted   Vitals:   06/29/16 0247 06/29/16 0308 06/29/16 0400 06/29/16 0500  BP:  (!) 99/54 (!) 86/75 (!) 93/56  Pulse:  (!) 128 (!) 117 93  Resp:  19 (!) 33 (!) 31  Temp:  98 F (36.7 C)    TempSrc:  Oral    SpO2: 95% 96% 97% 100%  Weight:  99.9 kg (220 lb 3.8 oz)    Height:  5\' 9"  (1.753 m)      Intake/Output Summary (Last 24 hours) at 06/29/16 0750 Last data filed at 06/28/16 2315  Gross per 24 hour  Intake                0 ml  Output              175 ml  Net             -175 ml    LABS: Basic Metabolic Panel:  Recent Labs  06/28/16 0659 06/29/16 0413  NA 136 131*  K 4.3 5.2*  CL 107 108  CO2 19* 11*  GLUCOSE 99 208*  BUN 44* 57*  CREATININE 1.72* 2.00*  CALCIUM 7.9* 7.4*   Liver Function Tests:  Recent Labs  06/26/16 1846  AST 51*  ALT 31  ALKPHOS 58  BILITOT 0.7  PROT 7.5  ALBUMIN 2.2*   No results for input(s): LIPASE, AMYLASE in the last 72 hours. CBC:  Recent Labs  06/26/16 1846 06/27/16 0338 06/29/16 0413  WBC 22.5* 22.8* 17.7*  NEUTROABS 17.3*  --   --   HGB 11.7* 10.6* 7.9*  HCT 35.5* 32.3* 25.0*  MCV 88.7 87.7 87.9  PLT 275 254 250   Cardiac Enzymes: No results for input(s): CKTOTAL, CKMB, CKMBINDEX, TROPONINI in the last 72 hours. BNP: Invalid input(s): POCBNP D-Dimer: No results for input(s): DDIMER in the last 72 hours. Hemoglobin A1C:  Recent Labs  06/26/16 1846  HGBA1C 7.4*   Fasting Lipid Panel: No results for input(s): CHOL, HDL, LDLCALC, TRIG, CHOLHDL, LDLDIRECT in the last 72 hours. Thyroid Function Tests: No results for input(s): TSH, T4TOTAL, T3FREE, THYROIDAB in the last 72 hours.  Invalid input(s): FREET3 Anemia Panel: No results for input(s): VITAMINB12, FOLATE, FERRITIN, TIBC, IRON, RETICCTPCT in the last 72 hours.   Physical Exam: Blood pressure (!) 93/56, pulse 93, temperature 98 F (36.7 C), temperature source Oral,  resp. rate (!) 31, height 5\' 9"  (1.753 m), weight 99.9 kg (220 lb 3.8 oz), SpO2 100 %.   Wt Readings from Last 1 Encounters:  06/29/16 99.9 kg (220 lb 3.8 oz)     General appearance: cooperative Resp: rhonchi bilaterally Cardio: irregularly irregular rhythm GI: abnormal findings:  no pain. had rectal bleeding last pm Neurologic: Mental status: responsive but somewhat slow   TELEMETRY: Reviewed telemetry pt in afib with variable vr. Had episode of afib with slow vr with rates in the 50's with hypotension.  ASSESSMENT AND PLAN:  Active Problems:   Sepsis (HCC)-Events of last pm noted. Developed hypotension, slow vr with bleeding per rectum last pm. Was less responsive during event but when given iv fluids and aroused, heart rate and blood pressure improved. Transferred to icu. Continued with abx. Pressors not required at present. Hemodynamics improved.  afib-variable vr. Slow vr last pm. Had rectal bleeding. HR and bp improved with iv fluids. Will need to remain off of anticoagulation as he had bleeding with xarelto and eliquis. Continue  with cardizem at 30 q 6 and carvedilol for now and follow heart rate. Will attempt to keep vr less than 120's.  No anticoagulation  ckd-creatinine bumped up to 2.0 from 1.72. Will follow. Likely secondary to transient hypotension  Respiratory failure-continue with abx, bronchodilators and bipap per critical care.     Teodoro Spray, MD, [facility] 06/29/2016 7:50 AM

## 2016-06-29 NOTE — Consult Note (Signed)
Name: Glenn Reeves MRN: [id number] DOB: 1930/05/29    ADMISSION DATE:  06/26/2016 CONSULTATION DATE:  06/29/2016  REFERRING MD :  Dr. Max Sane  CHIEF COMPLAINT:  Weakness, Diarrhea, Fall  BRIEF PATIENT DESCRIPTION:  Glenn Reeves admitted to [facility] Telemetry unit on 3/5 for treatment of Bibasilar HCAP, UTI, sepsis, and A-fib with RVR.  In the am of 3/8, pt transferred to ICU due to hypotension and GI bleeding.  SIGNIFICANT EVENTS  06/26/16>> Admission to [facility] Telemetry unit  06/29/16>> Transfer to [facility] ICU due to hypotension 06/29/16>> PCCM consulted STUDIES:  3/8 CT Chest: diffuse reticulonodular changes c/w chronic pulmonary fibrosis. Expanding L pleural effusion   HISTORY OF PRESENT ILLNESS:  Glenn Reeves is a 83 y.o. Male with a PMH of Thyroid disease, HTN, GI Bleed, DM, CKD Stage III with baseline Creatinine 1.4-1.6, CAD, & Atrial Fibrillation. In January of 2018, pt was hospitalized and treated for Pneumonia in [place], in which he was discharged to a rehab facility in [place] and completed a course of Augmentin.  Post discharge from the rehab facility, he came to [place] to stay with his daughter.  On Sunday 3/4 he began feeling weak with loss of appetite.    He presented to [facility] ED on 3/5 with c/o weakness, fall, and diarrhea.  In the Emergency Department, pt was in A-fib RVR, Lactic acid was 3.9, and CXR  was concerning for bibasilar Pneumonia, therefore he was admitted to [facility] Telemetry unit for treatment of sepsis secondary to HCAP and UTI, and A-fib with RVR.  In the am of 3/8, pt was found in pool of blood, unresponsive, hypotensive with BP 74/42, and bradycardic with HR 50.  Rapid response was called, NS bolus initiated, and he was transferred to ICU.  After initiation of fluids, he became alert and oriented, and pulse improved to 93. PCCM is consulted 3/8 for further management of hypotension, likely secondary to acute GI Blood loss.  PAST MEDICAL HISTORY :     has a past medical history of Atrial fibrillation ([place]); CAD (coronary artery disease); Diabetes mellitus without complication ([place]); GI bleed; Hypertension; and Thyroid disease.  has no past surgical history on file. Prior to Admission medications   Medication Sig Start Date End Date Taking? Authorizing Provider  amoxicillin-clavulanate (AUGMENTIN) 875-125 MG tablet Take 1 tablet by mouth 2 (two) times daily. 06/26/16  Yes Historical Provider, MD  aspirin EC 81 MG tablet Take 1 tablet by mouth daily.   Yes Historical Provider, MD  carvedilol (COREG) 6.25 MG tablet Take 6.25 mg by mouth 2 (two) times daily. 06/13/16  Yes Historical Provider, MD  ELIQUIS 5 MG TABS tablet Take 0.5 tablets by mouth 2 (two) times daily.  06/07/16  Yes Historical Provider, MD  finasteride (PROSCAR) 5 MG tablet Take 5 mg by mouth daily. 06/07/16  Yes Historical Provider, MD  glipiZIDE (GLUCOTROL XL) 10 MG 24 hr tablet Take 10 mg by mouth daily with breakfast.   Yes Historical Provider, MD  guaiFENesin (MUCINEX) 600 MG 12 hr tablet Take 2 tablets by mouth 2 (two) times daily. 05/24/16  Yes Historical Provider, MD  insulin detemir (LEVEMIR) 100 UNIT/ML injection Inject into the skin 3 (three) times daily. Sliding scale   Yes Historical Provider, MD  ipratropium-albuterol (DUONEB) 0.5-2.5 (3) MG/3ML SOLN Inhale 3 mLs into the lungs 2 (two) times daily.  05/25/16  Yes Historical Provider, MD  levothyroxine (SYNTHROID, LEVOTHROID) 75 MCG tablet Take 75 mcg by mouth daily. 06/07/16  Yes Historical Provider, MD  montelukast (SINGULAIR) 10 MG tablet Take 1 tablet by mouth daily. 06/07/16  Yes Historical Provider, MD  rosuvastatin (CRESTOR) 20 MG tablet Take 20 mg by mouth daily. 06/13/16  Yes Historical Provider, MD  glipiZIDE (GLUCOTROL) 5 MG tablet Take 5 mg by mouth 2 (two) times daily. 06/07/16   Historical Provider, MD  HUMALOG KWIKPEN 100 UNIT/ML KiwkPen Take 12 Units by mouth 3 (three) times daily. 06/07/16   Historical Provider, MD   lisinopril-hydrochlorothiazide (PRINZIDE,ZESTORETIC) 20-12.5 MG tablet Take 1 tablet by mouth daily.    Historical Provider, MD  polycarbophil (FIBERCON) 625 MG tablet Take 625 mg by mouth daily as needed.    Historical Provider, MD   No Known Allergies  FAMILY HISTORY:  family history is not on file. SOCIAL HISTORY:  reports that he has never smoked. He has never used smokeless tobacco. He reports that he does not drink alcohol or use drugs.  REVIEW OF SYSTEMS:  Positives in BOLD Constitutional: Negative for fever, chills, weight loss, malaise/fatigue and diaphoresis.  HENT: Negative for hearing loss, ear pain, nosebleeds, congestion, sore throat, neck pain, tinnitus and ear discharge.   Eyes: Negative for blurred vision, double vision, photophobia, pain, discharge and redness.  Respiratory: Negative for + cough, hemoptysis, + sputum production, shortness of breath, wheezing and stridor.   Cardiovascular: Negative for chest pain, palpitations, orthopnea, claudication, leg swelling and PND.  Gastrointestinal: Negative for heartburn, nausea, vomiting, abdominal pain, diarrhea, constipation, blood in stool and melena.  Genitourinary: Negative for dysuria, urgency, + frequency, hematuria and flank pain.  Musculoskeletal: Negative for myalgias, back pain, joint pain and falls.  Skin: Negative for itching and rash.  Neurological: Negative for dizziness, tingling, tremors, sensory change, speech change, focal weakness, seizures, loss of consciousness, weakness and headaches.  Endo/Heme/Allergies: Negative for environmental allergies and polydipsia. Does not bruise/bleed easily.  SUBJECTIVE:  Pt states he is feeling better, does complain of productive cough with white sputum  VITAL SIGNS: Temp:  [98 F (36.7 C)-98.1 F (36.7 C)] 98.1 F (36.7 C) (03/08 0930) Pulse Rate:  [60-152] 109 (03/08 0830) Resp:  [18-33] 26 (03/08 0830) BP: (74-128)/(41-75) 115/68 (03/08 0830) SpO2:  [95 %-100 %]  100 % (03/08 0830) Weight:  [220 lb 3.8 oz (99.9 kg)] 220 lb 3.8 oz (99.9 kg) (03/08 0308)  PHYSICAL EXAMINATION: General:  Obese caucasian male, sitting in bed, in no acute distress Neuro:  Alert and oriented x4, PERRL, Follow commands HEENT:  Hard of hearing, Atraumatic, Normocephalic, Neck supple, No JVD Cardiovascular:  Irregularly irregular rhythm, No M/R/G Lungs:  Diminished throughout, symmetrical expansion, No accessory muscle use, No increased work of breathing Abdomen:  Obese, Soft, Non-tender, Non-distended, BS x4 Musculoskeletal:  Normal bulk and tone,  Skin:  Warm, intact. No rashes, lesions, or ulcerations   Recent Labs Lab 06/27/16 0338 06/28/16 0659 06/29/16 0413  NA 137 136 131*  K 4.4 4.3 5.2*  CL 109 107 108  CO2 21* 19* 11*  BUN 43* 44* 57*  CREATININE 2.07* 1.72* 2.00*  GLUCOSE 149* 99 208*    Recent Labs Lab 06/26/16 1846 06/27/16 0338 06/29/16 0413  HGB 11.7* 10.6* 7.9*  HCT 35.5* 32.3* 25.0*  WBC 22.5* 22.8* 17.7*  PLT 275 254 250   Dg Chest Port 1 View  Result Date: 06/29/2016 CLINICAL DATA:  Respiratory failure. EXAM: PORTABLE CHEST 1 VIEW COMPARISON:  06/26/2016 FINDINGS: The cardiomediastinal silhouette is unchanged. Lung volumes remain diminished with mild elevation of the right hemidiaphragm. Basilar  predominant reticular densities, with asymmetric involvement of the left lung base, are similar to the prior study and suggestive of fibrosis. Patchy left basilar opacity is stable to minimally increased, and a small left pleural effusion also appears increased and may be partially loculated or associated with pleural thickening. No pneumothorax is identified. Surgical clips are present in the left neck. IMPRESSION: Background lung changes suggestive of fibrosis with stable to slightly increased asymmetric left basilar opacity which may reflect superimposed pneumonia. Small left pleural effusion, slightly increased. Electronically Signed   By: Logan Bores M.D.   On: 06/29/2016 09:37    ASSESSMENT / PLAN:  PULMONARY: A: HCAP Hx: COPD L pleural effusion P: Continue Cefepime Continue scheduled Duonebs Obtain CT Chest 3/8 - reviewed. Expanding L pl eff . IR thoracentesis requested  CARDIOVASCULAR: A: Hypotension, likely secondary to Acute GI Blood loss A-fib>> Rate better controlled P: Continuous Telemetry monitoring Maintain MAP>65 NS @ 75 ml/hr Neosynephrine if needed to maintain MAP goal Cardiology following, appreciate input Continue Coreg & Cardizem per Cardiology recommendations Hold home Lisinopril and HCTZ  GASTROINTESTINAL A:  Acute GI Bleed Anemia  P: GI consult, appreciate input Protonix gtt Keep NPO Recheck Hbg & Hct 3/8 in the afternoon Monitor for additional Bloody stool/Bleeding Fecal Occult Blood test with next stool Hold home Eliquis  INFECTIOUS A: Sepsis secondary to HCAP and UTI>>Improving MRSA PCR Positive Leukocytosis P: Follow WBC's Monitor Fever Curve Trend PCT's Continue Cefepime Start Bactroban Nasal Ointment per protocol  GENITOURINARY A: Acute kidney injury superimposed on CKD Stage III UTI Hx: CKD Stage III P: Monitor I&O Follow BMP's NS @ 75 ml/hr Obtain Bladder scan Consider placing foley if pt having incomplete emptying Continue Cefepime  HEME: A: Acute blood loss anemia Chronic anticoagulation prior to admission (for PAF) P: DVT px: SCDs Monitor CBC intermittently Transfuse per usual guidelines Holding apixaban  CCM time: 45 mins The above time includes time spent in consultation with patient and/or family members and reviewing care plan on multidisciplinary rounds  Merton Border, MD PCCM service Mobile [phone] Pager [phone]  06/29/2016, 11:36 AM

## 2016-06-29 NOTE — Progress Notes (Signed)
eLink Physician-Brief Progress Note Patient Name: Glenn Reeves DOB: 08/23/1930 MRN: [id number]   Date of Service  06/29/2016  HPI/Events of Note  Patient intubated. GI bleed.   eICU Interventions  SCDs ordered for DVT prophylaxis      Intervention Category Minor Interventions: Routine modifications to care plan (e.g. PRN medications for pain, fever)  Tera Partridge 06/29/2016, 4:59 PM

## 2016-06-29 NOTE — Progress Notes (Signed)
[facility] [place] NAME: Glenn Reeves    MR#:  [id number]  DATE OF BIRTH:  [birth year]  SUBJECTIVE:  Had episode of hypotension, rectal bleed last night (after starting eliquis) - requiring transfer to ICU REVIEW OF SYSTEMS:   Review of Systems  Constitutional: Negative for chills, fever and weight loss.  HENT: Negative for ear discharge, ear pain and nosebleeds.   Eyes: Negative for blurred vision, pain and discharge.  Respiratory: Positive for shortness of breath. Negative for sputum production, wheezing and stridor.   Cardiovascular: Negative for chest pain, palpitations, orthopnea and PND.  Gastrointestinal: Positive for blood in stool. Negative for abdominal pain, diarrhea, nausea and vomiting.  Genitourinary: Negative for frequency and urgency.  Musculoskeletal: Negative for back pain and joint pain.  Neurological: Positive for weakness. Negative for sensory change, speech change and focal weakness.  Psychiatric/Behavioral: Negative for depression and hallucinations. The patient is not nervous/anxious.    Tolerating Diet:yes Tolerating PT: pending  DRUG ALLERGIES:  No Known Allergies  VITALS:  Blood pressure (!) 91/53, pulse 96, temperature 98.1 F (36.7 C), resp. rate (!) 27, height 5\' 9"  (1.753 m), weight 99.9 kg (220 lb 3.8 oz), SpO2 99 %.  PHYSICAL EXAMINATION:   Physical Exam  GENERAL:  83 y.o.-year-old patient lying in the bed with no acute distress. Acutely ill EYES: Pupils equal, round, reactive to light and accommodation. No scleral icterus. Extraocular muscles intact.  HEENT: Head atraumatic, normocephalic. Oropharynx and nasopharynx clear.  NECK:  Supple, no jugular venous distention. No thyroid enlargement, no tenderness.  LUNGS: distnat breath sounds bilaterally, no wheezing, rales, rhonchi. No use of accessory muscles of respiration. Has face-mask on CARDIOVASCULAR: S1, S2 normal. No murmurs, rubs, or  gallops.  ABDOMEN: Soft, nontender, nondistended. Bowel sounds present. No organomegaly or mass.  EXTREMITIES: No cyanosis, clubbing or edema b/l.    NEUROLOGIC: Cranial nerves II through XII are intact. No focal Motor or sensory deficits b/l.   PSYCHIATRIC:  patient is alert and oriented x 3.  SKIN: No obvious rash, lesion, or ulcer.  LABORATORY PANEL:  CBC  Recent Labs Lab 06/29/16 0413  WBC 17.7*  HGB 7.9*  HCT 25.0*  PLT 250    Chemistries   Recent Labs Lab 06/26/16 1846  06/29/16 0413  NA 133*  < > 131*  K 4.6  < > 5.2*  CL 100*  < > 108  CO2 20*  < > 11*  GLUCOSE 173*  < > 208*  BUN 37*  < > 57*  CREATININE 2.24*  < > 2.00*  CALCIUM 8.0*  < > 7.4*  AST 51*  --   --   ALT 31  --   --   ALKPHOS 58  --   --   BILITOT 0.7  --   --   < > = values in this interval not displayed. Cardiac Enzymes No results for input(s): TROPONINI in the last 168 hours. RADIOLOGY:  Ct Chest Wo Contrast  Result Date: 06/29/2016 CLINICAL DATA:  Respiratory failure and cough, generalized weakness, diarrhea, recent fall EXAM: CT CHEST WITHOUT CONTRAST TECHNIQUE: Multidetector CT imaging of the chest was performed following the standard protocol without IV contrast. COMPARISON:  Portable chest x-ray of 06/29/2016 and 06/26/2016 FINDINGS: Cardiovascular: On this unenhanced study, there is significant thoracic aortic atherosclerosis present. The mid ascending thoracic aorta measures 43 mm in diameter. Recommend annual imaging followup by CTA or MRA. This recommendation follows 2010 ACCF/AHA/AATS/ACR/ASA/SCA/SCAI/SIR/STS/SVM Guidelines  for the Diagnosis and Management of Patients with Thoracic Aortic Disease. Circulation. 2010; 121: B341-P379. There is cardiomegaly present. Diffuse coronary artery calcifications are noted. No pericardial effusion is seen. Mediastinum/Nodes: On this unenhanced study, no mediastinal or hilar adenopathy is noted Lungs/Pleura: There peripheral opacity at the left lung base  on chest x-ray appears to represent loculated left pleural effusion. However, throughout the lungs there are marked changes of interstitial lung disease. Considerable honeycombing is present in the lung bases. Diffuse areas of fibrosis are present and there is some traction bronchiectasis within the lingula. The majority of the findings are peripheral and subpleural with a lower lobe predominance, typical of idiopathic pulmonary fibrosis. No definite pneumonia is seen. The central airway is patent. Upper Abdomen: Multiple splenic and hepatic calcified granulomas are present secondary to prior granulomatous disease. Surgical clips are noted from prior cholecystectomy. Musculoskeletal: The thoracic vertebrae are slightly kyphotic with diffuse degenerative change present throughout the thoracic spine. No compression deformity is seen. IMPRESSION: 1. Severe changes of interstitial lung disease as described above, most consistent with idiopathic pulmonary fibrosis. 2. Fusiform dilatation of the mid ascending thoracic aorta measuring 43 mm in diameter. Recommend annual imaging followup by CTA or MRA. This recommendation follows 2010 ACCF/AHA/AATS/ACR/ASA/SCA/SCAI/SIR/STS/SVM Guidelines for the Diagnosis and Management of Patients with Thoracic Aortic Disease. Circulation. 2010; 121: K240-X735. 3. Diffuse coronary artery calcifications. 4. Changes of prior granulomatous disease. Electronically Signed   By: Ivar Drape M.D.   On: 06/29/2016 11:53   Dg Chest Port 1 View  Result Date: 06/29/2016 CLINICAL DATA:  Respiratory failure. EXAM: PORTABLE CHEST 1 VIEW COMPARISON:  06/26/2016 FINDINGS: The cardiomediastinal silhouette is unchanged. Lung volumes remain diminished with mild elevation of the right hemidiaphragm. Basilar predominant reticular densities, with asymmetric involvement of the left lung base, are similar to the prior study and suggestive of fibrosis. Patchy left basilar opacity is stable to minimally  increased, and a small left pleural effusion also appears increased and may be partially loculated or associated with pleural thickening. No pneumothorax is identified. Surgical clips are present in the left neck. IMPRESSION: Background lung changes suggestive of fibrosis with stable to slightly increased asymmetric left basilar opacity which may reflect superimposed pneumonia. Small left pleural effusion, slightly increased. Electronically Signed   By: Logan Bores M.D.   On: 06/29/2016 09:37   ASSESSMENT AND PLAN:  Glenn Reeves  is a 83 y.o. male with a known history of CAD, atrial fibrillation, hypertension, CKD stage III who was recently treated in [facility] for pneumonia presents to the emergency room due to weakness, fall and diarrhea. Patient has been found to have bibasilar pneumonia, leukocytosis, tachycardia with elevated lactic acid of 3.9. He also was treated recently for UTI  * GI BLEED: - monitor in ICU - gi c/s - protonix drip - stop eliquis - Intensivist c/s for critical care mgmt - pt may need intubation for EGD  * Acute blood loss anemia - Hb 7.9, consider transfusion if Hb drops < 7  * Sepsis due to bibasilar healthcare acquired pneumonia and UTI Cont cefepime Blood cultures negative so far  * Diarrhea Resolved c diff neg  * Atrial fibrillation with rapid ventricular rate due to sepsis - Continue Coreg. Add cardizem for better HR control - restart eliquis das renal function improving  * Hypotension - Hold lisinopril and hydrochlorothiazide. - may need pressors - Intensivist c/s for critical care mgmt - pt may need intubation for EGD  * Acute kidney injury over  CKD stage III Due to sepsis. Check urinalysis. IV fluids. Monitor input and output. Repeat labs in the morning. Baseline creat 1.4-1.6 -came in with creat 2.24---2.07-> 1.72-> 2.0  * CAD Patient has decided not to get any catheterization or CABG and is on medical management.  *  severe pulmonary fibrosis and Emphysema -on nebs and oxygen - pulmo c/s    Case discussed with Care Management/Social Worker. Management plans discussed with the patient, nursing and they are in agreement.  CODE STATUS: FULL   TOTAL TIME TAKING CARE OF THIS PATIENT: 30 minutes.  >50% time spent on counselling and coordination of care  POSSIBLE D/C IN 2-3 DAYS, DEPENDING ON CLINICAL CONDITION. And GI, CARDIAC EVAL  Note: This dictation was prepared with Dragon dictation along with smaller phrase technology. Any transcriptional errors that result from this process are unintentional.  Max Sane M.D on 06/29/2016 at 1:52 PM  Between 7am to 6pm - Pager - [phone]  After 6pm go to [email or web address] - password [email or web address] [facility]  Office  [phone]  CC: Primary care physician; Pcp Not In System

## 2016-06-29 NOTE — Progress Notes (Signed)
This RN noticed patient heart rate was 50. Upon assessment of patient, patient was unresponsive but pulse was palpable, and breathing on own. Nursing staff noticed that patient was lying in a pool of blood, the source of bleeding was his rectum. BP 74/42. Rapid response called. IVF bolus started. After a few moments, patient became alert and orientedx4, heart rate improved to 93. Patient transferred to CCU 8. Family notified. Belongings at bedside. Report given to [facility].

## 2016-06-29 NOTE — Progress Notes (Signed)
Patient intubated with size 8 ETT, 24cm at lip.  Positive color change on c02 detector, condensation noted in ETT, equal bilateral breath sounds.

## 2016-06-29 NOTE — Progress Notes (Signed)
D/w Dr Alva Garnet. Patient is intubated for now. Will transfer service to him. Will sign off.

## 2016-06-29 NOTE — Progress Notes (Signed)
Rapid response called. Pt on home CPAP with O2 sat of 95%. Pt taken off CPAP and CPAP packaged for transport to CCU. O2 sat on room air is 95%.

## 2016-06-29 NOTE — Consult Note (Signed)
Was called to see this patient with a acute GI bleed with multiple episodes of maroon stools.  Consent was obtained from the patient's daughter and son-in-law who is a Stage manager.  The patient underwent upper endoscopy that showed a ulcer with a visible vessel and adherent clot.  The patient had the area injected with epinephrine and the vessel was clipped.  Recommend continuing PPI following hematocrit and hemoglobin and if any further bleeding to occur the patient should be considered for vascular surgery intervention versus general surgery intervention.  The family has been explained the plan.

## 2016-06-29 NOTE — Progress Notes (Addendum)
Inpatient Diabetes Program Recommendations  AACE/ADA: New Consensus Statement on Inpatient Glycemic Control (2015)  Target Ranges:  Prepandial:   less than 140 mg/dL      Peak postprandial:   less than 180 mg/dL (1-2 hours)      Critically ill patients:  140 - 180 mg/dL   Results for CONN, TROMBETTA (MRN [id number]) as of 06/29/2016 07:57  Ref. Range 06/28/2016 07:35 06/28/2016 11:37 06/28/2016 17:30 06/28/2016 19:24 06/28/2016 21:00  Glucose-Capillary Latest Ref Range: 65 - 99 mg/dL 89 157 (H) 54 (L) 164 (H) 141 (H)    Admit with: Pneumonia/ Diarrhea  History: DM, CKD  Home DM Meds: Levemir 45 units daily (see Chart Review note from MD in [place] 05/21/16)       Humalog 12 units TID       Glipizide 5 mg BID  Current Insulin Orders: Novolog Moderate Correction Scale/ SSI (0-15 units) TID AC + HS      Novolog 8 units TID with meals     MD- Note patient had Hypoglycemic event yesterday at 5pm after receiving a total of 11 units Novolog at lunch (3 units SSI + 8 units meal coverage).  Note patient now NPO after found with rectal bleeding last night.  Please consider the following while patient NPO:  1. Stop Novolog 8 units TID with meals for now  2. Change Novolog Moderate Correction Scale/ SSI (0-15 units) to Q4 hour coverage for now     --Will follow patient during hospitalization--  Wyn Quaker RN, MSN, CDE Diabetes Coordinator Inpatient Glycemic Control Team Team Pager: [phone] (8a-5p)

## 2016-06-29 NOTE — Op Note (Signed)
[facility] Gastroenterology Patient Name: Glenn Reeves Procedure Date: 06/29/2016 3:48 PM MRN: [id number] Account #: [id number] Date of Birth: January 04, 1931 Admit Type: Inpatient Age: 83 Room: [facility] ENDO ROOM 4 Gender: Male Note Status: Finalized Procedure:            Upper GI endoscopy Indications:          Melena Providers:            [name] MD, MD Referring MD:         No Local Md, MD (Referring MD) Medicines:            Fentanyl 200 micrograms IV Complications:        No immediate complications. Procedure:            Pre-Anesthesia Assessment:                       - Prior to the procedure, a History and Physical was                        performed, and patient medications and allergies were                        reviewed. The patient's tolerance of previous                        anesthesia was also reviewed. The risks and benefits of                        the procedure and the sedation options and risks were                        discussed with the patient. All questions were                        answered, and informed consent was obtained. Prior                        Anticoagulants: The patient has taken no previous                        anticoagulant or antiplatelet agents. ASA Grade                        Assessment: IV - A patient with severe systemic disease                        that is a constant threat to life. After reviewing the                        risks and benefits, the patient was deemed in                        satisfactory condition to undergo the procedure.                       After obtaining informed consent, the endoscope was                        passed under direct vision. Throughout the procedure,  the patient's blood pressure, pulse, and oxygen                        saturations were monitored continuously. The Endoscope                        was introduced through the mouth, and advanced to  the                        second part of duodenum. The upper GI endoscopy was                        accomplished without difficulty. The patient tolerated                        the procedure well. Findings:      The examined esophagus was normal.      Diffuse mild inflammation characterized by erythema was found in the       entire examined stomach.      One non-bleeding cratered duodenal ulcer with adherent clot was found in       the second portion of the duodenum. Area was successfully injected with       5 mL of a 1:10,000 solution of epinephrine for hemostasis. For       hemostasis, three hemostatic clips were successfully placed (MR       conditional). There was no bleeding at the end of the procedure. Impression:           - Normal esophagus.                       - Gastritis.                       - One non-bleeding duodenal ulcer with adherent clot.                        Injected. Clips (MR conditional) were placed.                       - No specimens collected. Recommendation:       - Monitor Hb. Procedure Code(s):    --- Professional ---                       [phone], Esophagogastroduodenoscopy, flexible, transoral;                        with control of bleeding, any method Diagnosis Code(s):    --- Professional ---                       K92.1, Melena (includes Hematochezia)                       K26.4, Chronic or unspecified duodenal ulcer with                        hemorrhage                       K29.70, Gastritis, unspecified, without bleeding CPT copyright 2016 American Medical Association. All rights reserved. The codes documented in this report are preliminary and  upon coder review may  be revised to meet current compliance requirements. [name] MD, MD 06/29/2016 4:11:22 PM This report has been signed electronically. Number of Addenda: 0 Note Initiated On: 06/29/2016 3:48 PM      [facility]

## 2016-06-30 ENCOUNTER — Inpatient Hospital Stay: Payer: Medicare Other

## 2016-06-30 ENCOUNTER — Encounter: Payer: Self-pay | Admitting: Gastroenterology

## 2016-06-30 DIAGNOSIS — N179 Acute kidney failure, unspecified: Secondary | ICD-10-CM

## 2016-06-30 DIAGNOSIS — R6520 Severe sepsis without septic shock: Secondary | ICD-10-CM

## 2016-06-30 DIAGNOSIS — A419 Sepsis, unspecified organism: Principal | ICD-10-CM

## 2016-06-30 DIAGNOSIS — J9621 Acute and chronic respiratory failure with hypoxia: Secondary | ICD-10-CM

## 2016-06-30 LAB — GLUCOSE, CAPILLARY
GLUCOSE-CAPILLARY: 113 mg/dL — AB (ref 65–99)
GLUCOSE-CAPILLARY: 101 mg/dL — AB (ref 65–99)
Glucose-Capillary: 188 mg/dL — ABNORMAL HIGH (ref 65–99)
Glucose-Capillary: 196 mg/dL — ABNORMAL HIGH (ref 65–99)
Glucose-Capillary: 206 mg/dL — ABNORMAL HIGH (ref 65–99)
Glucose-Capillary: 174 mg/dL — ABNORMAL HIGH (ref 65–99)

## 2016-06-30 LAB — CBC
HCT: 22.3 % — ABNORMAL LOW (ref 40.0–52.0)
HEMATOCRIT: 21.4 % — AB (ref 40.0–52.0)
Hemoglobin: 7.2 g/dL — ABNORMAL LOW (ref 13.0–18.0)
Hemoglobin: 7.1 g/dL — ABNORMAL LOW (ref 13.0–18.0)
MCH: 28 pg (ref 26.0–34.0)
MCH: 29.4 pg (ref 26.0–34.0)
MCHC: 32.1 g/dL (ref 32.0–36.0)
MCHC: 33.4 g/dL (ref 32.0–36.0)
MCV: 87.3 fL (ref 80.0–100.0)
MCV: 88.1 fL (ref 80.0–100.0)
Platelets: 322 10*3/uL (ref 150–440)
Platelets: 334 10*3/uL (ref 150–440)
RBC: 2.56 MIL/uL — AB (ref 4.40–5.90)
RBC: 2.43 MIL/uL — AB (ref 4.40–5.90)
RDW: 15.7 % — ABNORMAL HIGH (ref 11.5–14.5)
RDW: 15.7 % — ABNORMAL HIGH (ref 11.5–14.5)
WBC: 22.5 10*3/uL — ABNORMAL HIGH (ref 3.8–10.6)
WBC: 22 10*3/uL — AB (ref 3.8–10.6)

## 2016-06-30 LAB — COMPREHENSIVE METABOLIC PANEL
ALBUMIN: 1.5 g/dL — AB (ref 3.5–5.0)
ALK PHOS: 47 U/L (ref 38–126)
ALT: 22 U/L (ref 17–63)
ANION GAP: 8 (ref 5–15)
AST: 30 U/L (ref 15–41)
BUN: 68 mg/dL — ABNORMAL HIGH (ref 6–20)
CO2: 17 mmol/L — AB (ref 22–32)
Calcium: 7.7 mg/dL — ABNORMAL LOW (ref 8.9–10.3)
Chloride: 112 mmol/L — ABNORMAL HIGH (ref 101–111)
Creatinine, Ser: 2.12 mg/dL — ABNORMAL HIGH (ref 0.61–1.24)
GFR calc Af Amer: 31 mL/min — ABNORMAL LOW (ref >60)
GFR calc non Af Amer: 27 mL/min — ABNORMAL LOW (ref >60)
GLUCOSE: 119 mg/dL — AB (ref 65–99)
Potassium: 4.5 mmol/L (ref 3.5–5.1)
SODIUM: 137 mmol/L (ref 135–145)
Total Bilirubin: 0.2 mg/dL — ABNORMAL LOW (ref 0.3–1.2)
Total Protein: 5.4 g/dL — ABNORMAL LOW (ref 6.5–8.1)

## 2016-06-30 LAB — BASIC METABOLIC PANEL
ANION GAP: 5 (ref 5–15)
BUN: 69 mg/dL — ABNORMAL HIGH (ref 6–20)
CHLORIDE: 114 mmol/L — AB (ref 101–111)
CO2: 18 mmol/L — AB (ref 22–32)
CREATININE: 2.08 mg/dL — AB (ref 0.61–1.24)
Calcium: 7.3 mg/dL — ABNORMAL LOW (ref 8.9–10.3)
GFR calc non Af Amer: 28 mL/min — ABNORMAL LOW (ref >60)
GFR, EST AFRICAN AMERICAN: 32 mL/min — AB (ref >60)
Glucose, Bld: 191 mg/dL — ABNORMAL HIGH (ref 65–99)
POTASSIUM: 4.6 mmol/L (ref 3.5–5.1)
SODIUM: 137 mmol/L (ref 135–145)

## 2016-06-30 LAB — TSH: TSH: 1.839 u[IU]/mL (ref 0.350–4.500)

## 2016-06-30 LAB — PREPARE RBC (CROSSMATCH)

## 2016-06-30 LAB — PHOSPHORUS: Phosphorus: 3.2 mg/dL (ref 2.5–4.6)

## 2016-06-30 LAB — PROCALCITONIN: Procalcitonin: 1.64 ng/mL

## 2016-06-30 LAB — MAGNESIUM: Magnesium: 2.3 mg/dL (ref 1.7–2.4)

## 2016-06-30 LAB — ABO/RH: ABO/RH(D): A POS

## 2016-06-30 MED ORDER — ENSURE ENLIVE PO LIQD
237.0000 mL | Freq: Two times a day (BID) | ORAL | Status: DC
  Administered 2016-06-30 – 2016-07-05 (×8): 237 mL via ORAL

## 2016-06-30 MED ORDER — VANCOMYCIN HCL IN DEXTROSE 1-5 GM/200ML-% IV SOLN
1000.0000 mg | Freq: Once | INTRAVENOUS | Status: AC
  Administered 2016-06-30: 1000 mg via INTRAVENOUS
  Filled 2016-06-30: qty 200

## 2016-06-30 MED ORDER — DEXTROSE 5 % IV SOLN
2.0000 g | Freq: Two times a day (BID) | INTRAVENOUS | Status: DC
  Administered 2016-06-30 – 2016-07-04 (×8): 2 g via INTRAVENOUS
  Filled 2016-06-30 (×10): qty 2

## 2016-06-30 MED ORDER — INSULIN ASPART 100 UNIT/ML ~~LOC~~ SOLN
0.0000 [IU] | Freq: Every day | SUBCUTANEOUS | Status: DC
  Filled 2016-06-30: qty 5

## 2016-06-30 MED ORDER — INSULIN ASPART 100 UNIT/ML ~~LOC~~ SOLN
0.0000 [IU] | Freq: Three times a day (TID) | SUBCUTANEOUS | Status: DC
Start: 2016-06-30 — End: 2016-07-05
  Administered 2016-06-30: 3 [IU] via SUBCUTANEOUS
  Administered 2016-06-30 – 2016-07-01 (×2): 5 [IU] via SUBCUTANEOUS
  Administered 2016-07-01 – 2016-07-02 (×3): 3 [IU] via SUBCUTANEOUS
  Administered 2016-07-02: 5 [IU] via SUBCUTANEOUS
  Administered 2016-07-02: 2 [IU] via SUBCUTANEOUS
  Administered 2016-07-03 (×2): 3 [IU] via SUBCUTANEOUS
  Administered 2016-07-03: 8 [IU] via SUBCUTANEOUS
  Administered 2016-07-04 (×2): 3 [IU] via SUBCUTANEOUS
  Administered 2016-07-04: 5 [IU] via SUBCUTANEOUS
  Administered 2016-07-05: 2 [IU] via SUBCUTANEOUS
  Administered 2016-07-05: 3 [IU] via SUBCUTANEOUS
  Administered 2016-07-05: 8 [IU] via SUBCUTANEOUS
  Filled 2016-06-30: qty 3
  Filled 2016-06-30: qty 8
  Filled 2016-06-30 (×2): qty 3
  Filled 2016-06-30: qty 5
  Filled 2016-06-30: qty 2
  Filled 2016-06-30: qty 3
  Filled 2016-06-30 (×2): qty 5
  Filled 2016-06-30 (×3): qty 3
  Filled 2016-06-30 (×2): qty 2
  Filled 2016-06-30: qty 3
  Filled 2016-06-30: qty 8
  Filled 2016-06-30: qty 3

## 2016-06-30 MED ORDER — GUAIFENESIN-CODEINE 100-10 MG/5ML PO SOLN
10.0000 mL | ORAL | Status: DC | PRN
  Administered 2016-07-01 – 2016-07-02 (×2): 10 mL via ORAL
  Filled 2016-06-30 (×2): qty 10

## 2016-06-30 MED ORDER — VANCOMYCIN HCL IN DEXTROSE 1-5 GM/200ML-% IV SOLN
1000.0000 mg | INTRAVENOUS | Status: DC
  Administered 2016-06-30 – 2016-07-03 (×4): 1000 mg via INTRAVENOUS
  Filled 2016-06-30 (×5): qty 200

## 2016-06-30 NOTE — Clinical Social Work Placement (Addendum)
   CLINICAL SOCIAL WORK PLACEMENT  NOTE  Date:  06/30/2016  Patient Details  Name: Glenn Reeves MRN: [id number] Date of Birth: 1930/07/23  Clinical Social Work is seeking post-discharge placement for this patient at the [place] level of care (*CSW will initial, date and re-position this form in  chart as items are completed):  Yes   Patient/family provided with [facility] Work Department's list of facilities offering this level of care within the geographic area requested by the patient (or if unable, by the patient's family).  Yes   Patient/family informed of their freedom to choose among providers that offer the needed level of care, that participate in Medicare, Medicaid or managed care program needed by the patient, have an available bed and are willing to accept the patient.  Yes   Patient/family informed of [place] [facility]'s ownership interest in [facility] and [facility], as well as of the fact that they are under no obligation to receive care at these facilities.  PASRR submitted to EDS on 06/28/16     PASRR number received on  07-05-16 (updated Evette Cristal, MSW, [place], 07-05-16)     Existing PASRR number confirmed on       FL2 transmitted to all facilities in geographic area requested by pt/family on 06/28/16     FL2 transmitted to all facilities within larger geographic area on       Patient informed that his/her managed care company has contracts with or will negotiate with certain facilities, including the following:         07-02-16   Patient/family informed of bed offers received. (updated Evette Cristal, MSW, [place], 07-05-16)  Patient chooses bed at  [facility] (updated Evette Cristal, MSW, [place], 07-05-16)     Physician recommends and patient chooses bed at      Patient to be transferred to  [facility] on  07-05-16 (updated Evette Cristal, MSW, [place], 07-05-16).  Patient to be transferred to facility by  [facility] (updated Evette Cristal, MSW, [place], 07-05-16)    Patient family notified on  07-05-16 of transfer (updated Evette Cristal, MSW, [place], 07-05-16).  Name of family member notified:    [place] Grandchild daughter at bedside (updated Evette Cristal, MSW, [place], 07-05-16)    PHYSICIAN Please sign FL2     Additional Comment:    _______________________________________________ Ross Ludwig, LCSWA 06/30/2016, 4:46 PM  (updated Evette Cristal, MSW, [place], 07-05-16)

## 2016-06-30 NOTE — Progress Notes (Signed)
Pt is in stable condition. Pt pressor requirements are lessening. Pt is alert and oriented with poor PO intake. No complaints of pain on my shift. Pt has family at bedside. Full assessment in EPIC. Report given to [facility], Therapist, sports.

## 2016-06-30 NOTE — Progress Notes (Signed)
Name: Glenn Reeves MRN: [id number] DOB: 1931-02-23    ADMISSION DATE:  06/26/2016 CONSULTATION DATE:  06/29/2016  REFERRING MD :  Dr. Max Sane  CHIEF COMPLAINT:  Weakness, Diarrhea, Fall  BRIEF PATIENT DESCRIPTION:  [name] treatment of Bibasilar HCAP, UTI, sepsis, and A-fib with RVR.  In the am of 3/8, pt transferred to ICU due to hypotension and GI bleeding.   MAJOR EVENTS/TEST RESULTS: 03/05: Admission to [facility] Telemetry unit  03/08: Transfer to [facility] ICU due to hypotension. PCCM consulted 03/08 CT Chest: diffuse reticulonodular changes c/w chronic pulmonary fibrosis. Expanding L pleural effusion 03/08 GI consult and EGD. Diffuse mild inflammation characterized by erythema was found in the entire examined stomach. One non-bleeding cratered duodenal ulcer with adherent clot was found in the second portion of the duodenum. Area was successfully injected with epinephrine. Intubated for procedure and extubated after completed 03/09 Cycling on and off BiPAP  INDWELLING DEVICES:: ETT 03/08 >> 03/08 (intubated electively for EGD) L IJ CVL 03/08 >>   MICRO DATA: Urine 03/05 >> NEG Blood 03/05 >> NEG C diff 03/05 >> NEG MRSA PCR 03/06 >> POS   ANTIMICROBIALS:  Vanc 03/05 X 1 Metronidazole 03/05 >> 03/06 Cefepime 03/05 >>  Vancomycin 03/09 >>    SUBJECTIVE:  Cycling on and off BiPAP. No new complaints. No distress this AM on [place] O2  VITAL SIGNS: Temp:  [97.6 F (36.4 C)-98.7 F (37.1 C)] 97.9 F (36.6 C) (03/09 1200) Pulse Rate:  [75-119] 95 (03/09 1300) Resp:  [16-41] 18 (03/09 1300) BP: (67-140)/(38-107) 87/46 (03/09 1300) SpO2:  [85 %-100 %] 90 % (03/09 1300) FiO2 (%):  [21 %-28 %] 21 % (03/09 0435) Weight:  [228 lb 9.9 oz (103.7 kg)] 228 lb 9.9 oz (103.7 kg) (03/09 0500)  PHYSICAL EXAMINATION: General:  NAD, poorly oriented, calm Neuro:  No focal deficits HEENT:  NCAT Cardiovascular:  Irregularly irregular,  no murmurs Lungs: Bilateral basilar crackles, no wheezes Abdomen: Soft, NT, bowel sounds present Extremities: No edema, warm   Recent Labs Lab 06/28/16 0659 06/29/16 0413 06/30/16 0419  NA 136 131* 137  K 4.3 5.2* 4.5  CL 107 108 112*  CO2 19* 11* 17*  BUN 44* 57* 68*  CREATININE 1.72* 2.00* 2.12*  GLUCOSE 99 208* 119*    Recent Labs Lab 06/29/16 0413 06/29/16 1915 06/30/16 0419  HGB 7.9* 7.7* 7.2*  HCT 25.0* 23.0* 22.3*  WBC 17.7* 21.0* 22.5*  PLT 250 320 322   CXR: L pleural effusion appears slightly improved  ASSESSMENT / PLAN:  PULMONARY: A: Underlying pulmonary fibrosis Suspected HCAP L pleural effusion P: Continue supplemental oxygen  Continue prn BiPAP  CARDIOVASCULAR: A: Hypovolemic shock due to GI bleed CAF P: Continuous Telemetry monitoring Maintain MAP>65 Wean phenylephrine as able Holding anti-hypertensive meds  GASTROINTESTINAL A:  UGIB - not actively bleeding P: GI consult, appreciate input Cont PPI gtt Full liquids - advance as tolerated Holding Eliquis  INFECTIOUS A: UTI - treated Severe sepsis LLL PNA, NOS MRSA PCR Positive Elevated PCT P: Monitor temp, WBC count Micro and abx as above  GENITOURINARY A: CKD stage III AKI P: Monitor BMET intermittently Monitor I/Os Correct electrolytes as indicated  HEME: A: Acute blood loss anemia Chronic anticoagulation prior to admission (for PAF)  P: DVT px: SCDs Monitor CBC intermittently Transfuse per usual guidelines Holding apixaban  Daughter updated at bedside  CCM time: 35 mins  The above time includes time spent  in consultation with patient and/or family members and reviewing care plan on multidisciplinary rounds  Merton Border, MD PCCM service Mobile [phone] Pager [phone]  06/30/2016, 3:06 PM

## 2016-06-30 NOTE — Clinical Social Work Note (Signed)
Clinical Social Work Assessment  Patient Details  Name: Glenn Reeves MRN: [id number] Date of Birth: April 18, 1931  Date of referral:  06/30/16               Reason for consult:  Facility Placement                Permission sought to share information with:  Facility Sport and exercise psychologist, Family Supports Permission granted to share information::  Yes, Verbal Permission Granted  Name::     [place] Daughter   [phone] or [place] [phone]  [phone]   Agency::  SNF admissions  Relationship::     Contact Information:     Housing/Transportation Living arrangements for the past 2 months:  [place] of Information:  Adult Children Patient Interpreter Needed:  None Criminal Activity/Legal Involvement Pertinent to Current Situation/Hospitalization:  No - Comment as needed Significant Relationships:  Adult Children Lives with:  Self Do you feel safe going back to the place where you live?    Need for family participation in patient care:  Yes (Comment)  Care giving concerns:  Patient and family feel he needs some short term rehab before he is able to return back home.   Social Worker assessment / plan:  Patient is an 83 year old male who is from [place] and visiting his daughter in [place].  Patient is alert and oriented x 4 and able to express his feelings.  Patient's daughter [name] [phone] is at bedside, and states that patient normally lives alone, but he has been staying with her for about the past 3 weeks.  According to patient's daughter he had a fall on Monday which brought him to the Ed.  Patient's family expressed that he has not been to rehab before, CSW explained what the process is for looking for SNF placement and how insurance will pay for his stay at SNF.  Patient and family are in agreement to having CSW begin bed search process for SNF placement in [facility] [facility].  Employment status:   Retired Forensic scientist:  Medicare PT Recommendations:  [place] / Referral to community resources:  [place]  Patient/Family's Response to care:  Patient and family in agreement to going to SNF for short term rehab.  Patient/Family's Understanding of and Emotional Response to Diagnosis, Current Treatment, and Prognosis:  Patient's family is aware of current diagnosis and treatment plan, they are hopeful patient will not need to be in rehab very long.  Emotional Assessment Appearance:  Appears stated age Attitude/Demeanor/Rapport:    Affect (typically observed):  Unable to Assess Orientation:  Oriented to Self, Oriented to Place, Oriented to Situation, Oriented to  Time Alcohol / Substance use:  Not Applicable Psych involvement (Current and /or in the community):  No (Comment)  Discharge Needs  Concerns to be addressed:  Lack of Support Readmission within the last 30 days:  No Current discharge risk:  Lack of support system Barriers to Discharge:  Continued Medical Work up   Anell Barr 06/30/2016, 4:38 PM

## 2016-06-30 NOTE — Progress Notes (Signed)
ANTIBIOTIC NOTE  Pharmacy Consult for Cefepime and Vancomycin Indication: Pneumonia   No Known Allergies  Patient Measurements: Height: 5\' 9"  (175.3 cm) Weight: 228 lb 9.9 oz (103.7 kg) IBW/kg (Calculated) : 70.7  Vital Signs: Temp: 97.9 F (36.6 C) (03/09 1200) Temp Source: Oral (03/09 1200) BP: 87/46 (03/09 1300) Pulse Rate: 95 (03/09 1300) Intake/Output from previous day: 03/08 0701 - 03/09 0700 In: 2524.8 [I.V.:2424.8; IV Piggyback:100] Out: 505 [Urine:505] Intake/Output from this shift: Total I/O In: 30 [I.V.:30] Out: -   Labs:  Recent Labs  06/28/16 0659 06/29/16 0413 06/29/16 1915 06/30/16 0419  WBC  --  17.7* 21.0* 22.5*  HGB  --  7.9* 7.7* 7.2*  PLT  --  250 320 322  CREATININE 1.72* 2.00*  --  2.12*   Estimated Creatinine Clearance: 31.3 mL/min (by C-G formula based on SCr of 2.12 mg/dL (H)).  Recent Labs  06/27/16 2015  VANCORANDOM 10     Microbiology: Recent Results (from the past 720 hour(s))  C difficile quick scan w PCR reflex     Status: None   Collection Time: 06/26/16  7:23 PM  Result Value Ref Range Status   C Diff antigen NEGATIVE NEGATIVE Final   C Diff toxin NEGATIVE NEGATIVE Final   C Diff interpretation No C. difficile detected.  Final  Blood Culture (routine x 2)     Status: None (Preliminary result)   Collection Time: 06/26/16  7:50 PM  Result Value Ref Range Status   Specimen Description BLOOD L AC  Final   Special Requests BOTTLES DRAWN AEROBIC AND ANAEROBIC BCAV  Final   Culture NO GROWTH 4 DAYS  Final   Report Status PENDING  Incomplete  Blood Culture (routine x 2)     Status: None (Preliminary result)   Collection Time: 06/26/16  7:50 PM  Result Value Ref Range Status   Specimen Description BLOOD  R AC  Final   Special Requests BOTTLES DRAWN AEROBIC AND ANAEROBIC BCAV  Final   Culture NO GROWTH 4 DAYS  Final   Report Status PENDING  Incomplete  Urine culture     Status: None   Collection Time: 06/27/16  9:03 AM   Result Value Ref Range Status   Specimen Description URINE, RANDOM  Final   Special Requests NONE  Final   Culture   Final    NO GROWTH Performed at [facility], [place] [address] [place], [place] [address]    Report Status 06/28/2016 FINAL  Final  MRSA PCR Screening     Status: Abnormal   Collection Time: 06/27/16  8:36 PM  Result Value Ref Range Status   MRSA by PCR POSITIVE (A) NEGATIVE Final    Comment:        The GeneXpert MRSA Assay (FDA approved for NASAL specimens only), is one component of a comprehensive MRSA colonization surveillance program. It is not intended to diagnose MRSA infection nor to guide or monitor treatment for MRSA infections. RESULT CALLED TO, READ BACK BY AND VERIFIED WITH: TAKERA MESNICT 06/27/16 @ 2148  [place]     Medical History: Past Medical History:  Diagnosis Date  . Atrial fibrillation ([place])   . CAD (coronary artery disease)   . Diabetes mellitus without complication ([place])   . GI bleed   . Hypertension   . Thyroid disease     Medications:  Prescriptions Prior to Admission  Medication Sig Dispense Refill Last Dose  . amoxicillin-clavulanate (AUGMENTIN) 875-125 MG tablet Take 1 tablet  by mouth 2 (two) times daily.   06/26/2016 at Unknown time  . aspirin EC 81 MG tablet Take 1 tablet by mouth daily.   06/26/2016 at Unknown time  . carvedilol (COREG) 6.25 MG tablet Take 6.25 mg by mouth 2 (two) times daily.   06/26/2016 at Unknown time  . ELIQUIS 5 MG TABS tablet Take 0.5 tablets by mouth 2 (two) times daily.    06/26/2016 at 1500  . finasteride (PROSCAR) 5 MG tablet Take 5 mg by mouth daily.   06/26/2016 at Unknown time  . glipiZIDE (GLUCOTROL XL) 10 MG 24 hr tablet Take 10 mg by mouth daily with breakfast.   06/26/2016 at Unknown time  . guaiFENesin (MUCINEX) 600 MG 12 hr tablet Take 2 tablets by mouth 2 (two) times daily.   prn at prn  . insulin detemir (LEVEMIR) 100 UNIT/ML injection Inject into the skin 3 (three) times daily. Sliding scale    06/26/2016 at Unknown time  . ipratropium-albuterol (DUONEB) 0.5-2.5 (3) MG/3ML SOLN Inhale 3 mLs into the lungs 2 (two) times daily.    06/26/2016 at Unknown time  . levothyroxine (SYNTHROID, LEVOTHROID) 75 MCG tablet Take 75 mcg by mouth daily.   06/26/2016 at Unknown time  . montelukast (SINGULAIR) 10 MG tablet Take 1 tablet by mouth daily.   06/26/2016 at Unknown time  . rosuvastatin (CRESTOR) 20 MG tablet Take 20 mg by mouth daily.   06/25/2016 at Unknown time  . glipiZIDE (GLUCOTROL) 5 MG tablet Take 5 mg by mouth 2 (two) times daily.     Marland Kitchen HUMALOG KWIKPEN 100 UNIT/ML KiwkPen Take 12 Units by mouth 3 (three) times daily.     Marland Kitchen lisinopril-hydrochlorothiazide (PRINZIDE,ZESTORETIC) 20-12.5 MG tablet Take 1 tablet by mouth daily.     . polycarbophil (FIBERCON) 625 MG tablet Take 625 mg by mouth daily as needed.      Scheduled:  . carvedilol  6.25 mg Oral BID  . ceFEPIme (MAXIPIME) 2 GM IVP  2 g Intravenous Q24H  . Chlorhexidine Gluconate Cloth  6 each Topical Q0600  . diltiazem  30 mg Oral Q6H  . feeding supplement (ENSURE ENLIVE)  237 mL Oral BID BM  . finasteride  5 mg Oral Daily  . insulin aspart  0-15 Units Subcutaneous TID WC  . insulin aspart  0-5 Units Subcutaneous QHS  . ipratropium-albuterol  3 mL Nebulization BID  . levothyroxine  75 mcg Oral QAC breakfast  . mupirocin ointment  1 application Nasal BID  . rosuvastatin  20 mg Oral Daily  . sodium chloride flush  10-40 mL Intracatheter Q12H  . sodium chloride flush  3 mL Intravenous Q12H  . vancomycin  1,000 mg Intravenous Once  . vancomycin  1,000 mg Intravenous Q24H   Assessment: 83 y/o M with a h/o CAD, AF, CKD with recent treatment for PNA. Pharmacy consulted to dose cefepime. Vancomycin added 3/9.   Plan:  Will increase cefepime dosing to 2 g iv q 12 hours.   Vancomycin 1000 mg iv once followed by 1000 mg iv q 24 hours with stacked dosing. Will check levels/adjust dosing as clinically indicated to maintain a goal trough of  15-20 mcg/ml.   Pharmacy will continue to monitor and adjust per consult.   Ulice Dash D 06/30/2016,3:42 PM

## 2016-06-30 NOTE — Progress Notes (Signed)
Initial Nutrition Assessment  DOCUMENTATION CODES:   Obesity unspecified  INTERVENTION:  -Recommend Ensure Enlive po BID, each supplement provides 350 kcal and 20 grams of protein -Add Magic Cup BID while on FL diet   NUTRITION DIAGNOSIS:   Inadequate oral intake related to acute illness as evidenced by meal completion < 50% (liquid diet, poor appetite).  GOAL:   Patient will meet greater than or equal to 90% of their needs  MONITOR:   Diet advancement, Supplement acceptance, PO intake, Labs, Weight trends  REASON FOR ASSESSMENT:   Rounds    ASSESSMENT:   83 yo male admitted with GI bleed, sepsis due to pneumonia and UTI, diarrhea. Pt with hx of CAD, CKD III, HTN  3/8 pt found in pool of blood, unresponsive, hypotensive and bradycardic requiring transfer to ICU. Pt required did require brief intubation, currently room air  3/8 EGD: diffuse mild inflammation in stomach, 1 non-bleeding cratered duodenal ulcer with adherent clot in 2nd portion of duodenum (injected and 3 hemostatic clips were placed)  Pt reports he ate breakfast this AM but could not report what he had. Per Clyde Canterbury RN, pt ate ice cream and bites of cream of wheat.  Pt reports appetite is good, eating 3 meals a day with no nutritional supplements prior to admission. Pt reports he eats eggs and toast for breakfast but when asked about lunch and dinner, pt stated "i dont know."  Pt reports wt has been stable; no previous wt encounters  Nutrition-Focused physical exam completed. Findings are WDL for fat depletion, muscle depletion, and edema.   Labs: Creatinine 2.12 Meds: reviewed  Diet Order:  Diet full liquid Room service appropriate? Yes; Fluid consistency: Thin  Skin:  Reviewed, no issues  Last BM:  3/8  Height:   Ht Readings from Last 1 Encounters:  06/29/16 5\' 9"  (1.753 m)    Weight:   Wt Readings from Last 1 Encounters:  06/30/16 228 lb 9.9 oz (103.7 kg)    Ideal Body Weight:  72.7  kg  BMI:  Body mass index is 33.76 kg/m.  Estimated Nutritional Needs:   Kcal:   1800-2100 kcals  Protein:  95-110 g  Fluid:  >/= 1.8 L  EDUCATION NEEDS:   No education needs identified at this time  [place], [place], [place] [phone] Pager  [phone] Weekend/On-Call Pager

## 2016-06-30 NOTE — Plan of Care (Signed)
Problem: Education: Goal: Knowledge of  General Education information/materials will improve Outcome: Progressing Pt family is oriented to the unit. Had concerns address and questions answered.   Problem: Fluid Volume: Goal: Ability to maintain a balanced intake and output will improve Outcome: Progressing Pt requirement of pressors is decreasing slowly.

## 2016-06-30 NOTE — Clinical Social Work Note (Signed)
CSW to continue to follow patient's progress pending new PT orders and recommendation once patient transfers off of ICU.  Jones Broom. [place], MSW, [place]  06/30/2016 4:12 PM

## 2016-06-30 NOTE — Evaluation (Signed)
Physical Therapy Evaluation Patient Details Name: Glenn Reeves MRN: [id number] DOB: 1931/03/23 Today's Date: 06/30/2016   History of Present Illness  Pt is an 83 yo male admitted to [facility] due to weakness, progressive SOB, fall and diarrhea, diagnosed with sepsis with bibasilar pneumonia. PMH includes; CAD, A-fib, HTN, CKD III, Pneumonia, UTI, DM, chronic back pain and GI bleed. Pt now with complicated hospital stay secondary to spontaneous GI bleed, rapid response event called and intubation on 3/8 secondary to EGD. Pt extubated later on 3/8, however has difficulty keepings sats WNL while on room air. Pt re-evaluated this date.  Clinical Impression  Pt is a pleasant 83 year old male who was admitted for sepsis with pneumonia. Pt performs bed mobility with max assist +2 for scooting up towards HOB. Pt not safe for OOB mobility attempts this date secondary to low BP (90/54) and difficulty breathing with sats at 85% at room air. Pt asking for respiratory therapist after PT assessment. Pt demonstrates deficits with strength/mobility/transfers. Would benefit from skilled PT to address above deficits and promote optimal return to PLOF; recommend transition to STR upon discharge from acute hospitalization.       Follow Up Recommendations SNF    Equipment Recommendations  None recommended by PT    Recommendations for Other Services       Precautions / Restrictions Precautions Precautions: Fall Restrictions Weight Bearing Restrictions: No      Mobility  Bed Mobility Overal bed mobility: Needs Assistance Bed Mobility:  (scooting)           General bed mobility comments: Pt needs max assist +2 for scooting up towards HOB. Pt unable to pull with arms and tries to push with B LEs. Pt also needs assist for positioning once in bed  Transfers                 General transfer comment: unsafe secondary strength and vitals  Ambulation/Gait                Stairs            Wheelchair Mobility    Modified Rankin (Stroke Patients Only)       Balance                                             Pertinent Vitals/Pain Pain Assessment: No/denies pain    Home Living Family/patient expects to be discharged to:: Private residence Living Arrangements: Children Available Help at Discharge: Family Type of Home: House Home Access: Stairs to enter Entrance Stairs-Rails: None Entrance Stairs-Number of Steps: 1 Home Layout: Multi-level;Able to live on main level with bedroom/bathroom Home Equipment: Gilford Rile - 2 wheels;Cane - single point Additional Comments: states he has a RW and cane but doesn't use them, has a home in [place] but has been staying with his daughter here for the past few weeks due to medical status     Prior Function Level of Independence: Independent with assistive device(s)               Hand Dominance   Dominant Hand: Right    Extremity/Trunk Assessment   Upper Extremity Assessment Upper Extremity Assessment: Generalized weakness (B UE grossly 3+/5)    Lower Extremity Assessment Lower Extremity Assessment: Generalized weakness (B LE grossly 3+/5)       Communication   Communication: HOH  Cognition  Arousal/Alertness: Awake/alert Behavior During Therapy: WFL for tasks assessed/performed Overall Cognitive Status: Within Functional Limits for tasks assessed                      General Comments      Exercises Other Exercises Other Exercises: Supine ther-ex performed including B LE SLRs, hip abd/add, and SAQ. ALl ther-ex performed x 10 reps with min assist. Pt fatigues and needs frequent rest breaks. O2 sats decreased with exertion to 85% on room air. Cues given for pused lip breathing, able to improve to 88%.   Assessment/Plan    PT Assessment Patient needs continued PT services  PT Problem List Decreased activity tolerance;Decreased balance;Decreased mobility;Decreased  knowledge of use of DME;Cardiopulmonary status limiting activity;Decreased strength       PT Treatment Interventions DME instruction;Gait training;Stair training;Functional mobility training;Therapeutic exercise;Therapeutic activities;Balance training;Patient/family education    PT Goals (Current goals can be found in the Care Plan section)  Acute Rehab PT Goals Patient Stated Goal: To get better and return home PT Goal Formulation: With patient Time For Goal Achievement: 07/14/16 Potential to Achieve Goals: Good    Frequency Min 2X/week   Barriers to discharge        Co-evaluation               End of Session   Activity Tolerance: Patient limited by fatigue Patient left: in bed;with bed alarm set;with family/visitor present Nurse Communication: Mobility status PT Visit Diagnosis: Unsteadiness on feet (R26.81);Muscle weakness (generalized) (M62.81);History of falling (Z91.81);Difficulty in walking, not elsewhere classified (R26.2)         Time: 3299-2426 PT Time Calculation (min) (ACUTE ONLY): 14 min   Charges:   PT Evaluation $PT Re-evaluation: 1 Procedure     PT G Codes:         Ahri Olson July 29, 2016, 4:40 PM Greggory Stallion, PT, DPT [phone]

## 2016-07-01 ENCOUNTER — Inpatient Hospital Stay: Payer: Medicare Other

## 2016-07-01 DIAGNOSIS — J189 Pneumonia, unspecified organism: Secondary | ICD-10-CM

## 2016-07-01 DIAGNOSIS — D62 Acute posthemorrhagic anemia: Secondary | ICD-10-CM

## 2016-07-01 LAB — CBC
HEMATOCRIT: 23.5 % — AB (ref 40.0–52.0)
Hemoglobin: 8 g/dL — ABNORMAL LOW (ref 13.0–18.0)
MCH: 29.6 pg (ref 26.0–34.0)
MCHC: 33.9 g/dL (ref 32.0–36.0)
MCV: 87.5 fL (ref 80.0–100.0)
Platelets: 302 10*3/uL (ref 150–440)
RBC: 2.68 MIL/uL — ABNORMAL LOW (ref 4.40–5.90)
RDW: 15.4 % — AB (ref 11.5–14.5)
WBC: 21.9 10*3/uL — AB (ref 3.8–10.6)

## 2016-07-01 LAB — CULTURE, BLOOD (ROUTINE X 2)
CULTURE: NO GROWTH
Culture: NO GROWTH

## 2016-07-01 LAB — BASIC METABOLIC PANEL
Anion gap: 7 (ref 5–15)
BUN: 68 mg/dL — ABNORMAL HIGH (ref 6–20)
CHLORIDE: 113 mmol/L — AB (ref 101–111)
CO2: 18 mmol/L — ABNORMAL LOW (ref 22–32)
Calcium: 7.6 mg/dL — ABNORMAL LOW (ref 8.9–10.3)
Creatinine, Ser: 2.03 mg/dL — ABNORMAL HIGH (ref 0.61–1.24)
GFR calc Af Amer: 33 mL/min — ABNORMAL LOW (ref >60)
GFR calc non Af Amer: 29 mL/min — ABNORMAL LOW (ref >60)
GLUCOSE: 192 mg/dL — AB (ref 65–99)
POTASSIUM: 4.5 mmol/L (ref 3.5–5.1)
Sodium: 138 mmol/L (ref 135–145)

## 2016-07-01 LAB — GLUCOSE, CAPILLARY
GLUCOSE-CAPILLARY: 166 mg/dL — AB (ref 65–99)
GLUCOSE-CAPILLARY: 222 mg/dL — AB (ref 65–99)
GLUCOSE-CAPILLARY: 196 mg/dL — AB (ref 65–99)
Glucose-Capillary: 132 mg/dL — ABNORMAL HIGH (ref 65–99)

## 2016-07-01 LAB — PROCALCITONIN: Procalcitonin: 0.63 ng/mL

## 2016-07-01 MED ORDER — DILTIAZEM HCL ER 60 MG PO CP12
60.0000 mg | ORAL_CAPSULE | Freq: Two times a day (BID) | ORAL | Status: DC
  Administered 2016-07-01 – 2016-07-04 (×8): 60 mg via ORAL
  Filled 2016-07-01 (×11): qty 1

## 2016-07-01 MED ORDER — IPRATROPIUM-ALBUTEROL 0.5-2.5 (3) MG/3ML IN SOLN: 3.0000 mL | RESPIRATORY_TRACT | Status: DC | PRN

## 2016-07-01 NOTE — Progress Notes (Signed)
Patient being transferred out of ICU. Discussed with Dr. Leonidas Romberg.  Patient was transferred from medical floor to the ICU due to hypotension from GI bleed. Prior to this he was being treated for A. fib with RVR and bilateral basal pneumonia with pulmonary fibrosis. EGD showed diffuse gastritis and erythema. One nonbleeding ulcer with a clot. Epinephrine injected. One unit packed RBC transfused. Patient was deemed stable to be transferred to medical floor from the ICU.

## 2016-07-01 NOTE — Progress Notes (Signed)
Name: Glenn Reeves MRN: [id number] DOB: 10-24-1930    ADMISSION DATE:  06/26/2016 CONSULTATION DATE:  06/29/2016  REFERRING MD :  Glenn Reeves  CHIEF COMPLAINT:  Weakness, Diarrhea, Fall  BRIEF PATIENT DESCRIPTION:  Glenn Reeves admitted to [facility] Telemetry unit on 3/5 for treatment of Bibasilar HCAP, UTI, sepsis, and A-fib with RVR.  In the am of 3/8, pt transferred to ICU due to hypotension and GI bleeding.   MAJOR EVENTS/TEST RESULTS: 03/05: Admission to [facility] Telemetry unit  03/08: Transfer to [facility] ICU due to hypotension. PCCM consulted 03/08 CT Chest: diffuse reticulonodular changes c/w chronic pulmonary fibrosis. Expanding L pleural effusion 03/08 GI consult and EGD. Diffuse mild inflammation characterized by erythema was found in the entire examined stomach. One non-bleeding cratered duodenal ulcer with adherent clot was found in the second portion of the duodenum. Area was successfully injected with epinephrine. Intubated for procedure and extubated after completed 03/09 Cycling on and off BiPAP 03/10 Transfused on unit of PRBCs 03/10 Transfer to telemetry. Sound hospitalists to resume primary duties as of 03/11  INDWELLING DEVICES:: ETT 03/08 >> 03/08 (intubated electively for EGD) L IJ CVL 03/08 >>   MICRO DATA: Urine 03/05 >> NEG Blood 03/05 >> NEG C diff 03/05 >> NEG MRSA PCR 03/06 >> POS   ANTIMICROBIALS:  Vanc 03/05 X 1 Metronidazole 03/05 >> 03/06 Cefepime 03/05 >>  Vancomycin 03/09 >>    SUBJECTIVE:  Comfortable on room air. Off vasopressors . No new complaints. He received 1 unit of packed cells  VITAL SIGNS: Temp:  [97 F (36.1 C)-98.4 F (36.9 C)] 97.7 F (36.5 C) (03/10 0700) Pulse Rate:  [64-118] 67 (03/10 0700) Resp:  [18-35] 23 (03/10 0700) BP: (75-118)/(38-107) 95/58 (03/10 0700) SpO2:  [65 %-100 %] 92 % (03/10 0700) FiO2 (%):  [21 %] 21 % (03/10 0400) Weight:  [221 lb 12.5 oz (100.6 kg)] 221 lb 12.5 oz (100.6 kg) (03/10  0423)  PHYSICAL EXAMINATION: General:  NAD, poorly oriented, calm Neuro:  No focal deficits HEENT:  NCAT Cardiovascular:  Irregularly irregular, no murmurs Lungs: normal WOB, bilateral breath sounds, diminished in the bases, Bilateral basilar crackles, no wheezes Abdomen: Soft, NT, bowel sounds present Extremities: No edema, warm   Recent Labs Lab 06/30/16 0419 06/30/16 2221 07/01/16 0406  NA 137 137 138  K 4.5 4.6 4.5  CL 112* 114* 113*  CO2 17* 18* 18*  BUN 68* 69* 68*  CREATININE 2.12* 2.08* 2.03*  GLUCOSE 119* 191* 192*    Recent Labs Lab 06/30/16 0419 06/30/16 2221 07/01/16 0406  HGB 7.2* 7.1* 8.0*  HCT 22.3* 21.4* 23.5*  WBC 22.5* 22.0* 21.9*  PLT 322 334 302   CXR: L pleural effusion appears slightly improved  ASSESSMENT / PLAN: Underlying pulmonary fibrosis Suspected HCAP OSA L pleural effusion - loculated, small-moderate in size Hypovolemic shock due to GI bleed - resolved CAF, rate controlled CKD stage III AKI, nonoliguric UGIB/ gastric ulcer - does not appear to be actively bleeding at the present time UTI - fully treated Severe sepsis, resolved MRSA PCR Positive Mildly elevated PCT Acute blood loss anemia Chronic anticoagulation prior to admission (for PAF) - on hold P: Transfer to telemetry unit Sound hospitalists to resume primary duties as of 03/11. Discussed with Glenn Reeves Continue supplemental oxygen  Continue CPAP with sleep Patient's daughter wishes to forgo thoracentesis for now ContTelemetry monitoring Holding anti-hypertensive meds  Monitor BMET intermittently Monitor I/Os Correct electrolytes as indicated GI service following  Cont  PPI  Advance diet as tolerated Holding apixaban Monitor temp, WBC count Micro and abx as above - narrow coverage as able. Complete 7 days total DVT px: SCDs Monitor CBC intermittently Transfuse as needed for hemoglobin less than 7 or for hypotension with active bleeding   Daughter updated  at the bedside  Glenn Border, MD PCCM service Mobile [phone] Pager [phone] 07/01/2016, 7:22 AM

## 2016-07-01 NOTE — Progress Notes (Signed)
ANTIBIOTIC NOTE  Pharmacy Consult for Cefepime and Vancomycin Indication: Pneumonia   No Known Allergies  Patient Measurements: Height: 5\' 9"  (175.3 cm) Weight: 221 lb 12.5 oz (100.6 kg) IBW/kg (Calculated) : 70.7  Vital Signs: Temp: 97.7 F (36.5 C) (03/10 0700) Temp Source: Axillary (03/10 0700) BP: 85/44 (03/10 1000) Pulse Rate: 79 (03/10 1000) Intake/Output from previous day: 03/09 0701 - 03/10 0700 In: 2293.2 [I.V.:1518.2; Blood:325; IV Piggyback:450] Out: 600 [Urine:600] Intake/Output from this shift: Total I/O In: 605 [P.O.:480; I.V.:75; IV Piggyback:50] Out: -   Labs:  Recent Labs  06/30/16 0419 06/30/16 2221 07/01/16 0406  WBC 22.5* 22.0* 21.9*  HGB 7.2* 7.1* 8.0*  PLT 322 334 302  CREATININE 2.12* 2.08* 2.03*   Estimated Creatinine Clearance: 32.3 mL/min (by C-G formula based on SCr of 2.03 mg/dL (H)). No results for input(s): VANCOTROUGH, VANCOPEAK, VANCORANDOM, GENTTROUGH, GENTPEAK, GENTRANDOM, TOBRATROUGH, TOBRAPEAK, TOBRARND, AMIKACINPEAK, AMIKACINTROU, AMIKACIN in the last 72 hours.   Microbiology: Recent Results (from the past 720 hour(s))  C difficile quick scan w PCR reflex     Status: None   Collection Time: 06/26/16  7:23 PM  Result Value Ref Range Status   C Diff antigen NEGATIVE NEGATIVE Final   C Diff toxin NEGATIVE NEGATIVE Final   C Diff interpretation No C. difficile detected.  Final  Blood Culture (routine x 2)     Status: None   Collection Time: 06/26/16  7:50 PM  Result Value Ref Range Status   Specimen Description BLOOD L AC  Final   Special Requests BOTTLES DRAWN AEROBIC AND ANAEROBIC BCAV  Final   Culture NO GROWTH 5 DAYS  Final   Report Status 07/01/2016 FINAL  Final  Blood Culture (routine x 2)     Status: None   Collection Time: 06/26/16  7:50 PM  Result Value Ref Range Status   Specimen Description BLOOD  R AC  Final   Special Requests BOTTLES DRAWN AEROBIC AND ANAEROBIC BCAV  Final   Culture NO GROWTH 5 DAYS  Final   Report Status 07/01/2016 FINAL  Final  Urine culture     Status: None   Collection Time: 06/27/16  9:03 AM  Result Value Ref Range Status   Specimen Description URINE, RANDOM  Final   Special Requests NONE  Final   Culture   Final    NO GROWTH Performed at [facility], [place] [address], [place], [place] [address]    Report Status 06/28/2016 FINAL  Final  MRSA PCR Screening     Status: Abnormal   Collection Time: 06/27/16  8:36 PM  Result Value Ref Range Status   MRSA by PCR POSITIVE (A) NEGATIVE Final    Comment:        The GeneXpert MRSA Assay (FDA approved for NASAL specimens only), is one component of a comprehensive MRSA colonization surveillance program. It is not intended to diagnose MRSA infection nor to guide or monitor treatment for MRSA infections. RESULT CALLED TO, READ BACK BY AND VERIFIED WITH: TAKERA MESNICT 06/27/16 @ 2148  [place]     Medical History: Past Medical History:  Diagnosis Date  . Atrial fibrillation ([place])   . CAD (coronary artery disease)   . Diabetes mellitus without complication ([place])   . GI bleed   . Hypertension   . Thyroid disease     Medications:  Prescriptions Prior to Admission  Medication Sig Dispense Refill Last Dose  . amoxicillin-clavulanate (AUGMENTIN) 875-125 MG tablet Take 1 tablet by mouth 2 (  two) times daily.   06/26/2016 at Unknown time  . aspirin EC 81 MG tablet Take 1 tablet by mouth daily.   06/26/2016 at Unknown time  . carvedilol (COREG) 6.25 MG tablet Take 6.25 mg by mouth 2 (two) times daily.   06/26/2016 at Unknown time  . ELIQUIS 5 MG TABS tablet Take 0.5 tablets by mouth 2 (two) times daily.    06/26/2016 at 1500  . finasteride (PROSCAR) 5 MG tablet Take 5 mg by mouth daily.   06/26/2016 at Unknown time  . glipiZIDE (GLUCOTROL XL) 10 MG 24 hr tablet Take 10 mg by mouth daily with breakfast.   06/26/2016 at Unknown time  . guaiFENesin (MUCINEX) 600 MG 12 hr tablet Take 2 tablets by mouth 2 (two) times daily.   prn at prn  .  insulin detemir (LEVEMIR) 100 UNIT/ML injection Inject into the skin 3 (three) times daily. Sliding scale   06/26/2016 at Unknown time  . ipratropium-albuterol (DUONEB) 0.5-2.5 (3) MG/3ML SOLN Inhale 3 mLs into the lungs 2 (two) times daily.    06/26/2016 at Unknown time  . levothyroxine (SYNTHROID, LEVOTHROID) 75 MCG tablet Take 75 mcg by mouth daily.   06/26/2016 at Unknown time  . montelukast (SINGULAIR) 10 MG tablet Take 1 tablet by mouth daily.   06/26/2016 at Unknown time  . rosuvastatin (CRESTOR) 20 MG tablet Take 20 mg by mouth daily.   06/25/2016 at Unknown time  . glipiZIDE (GLUCOTROL) 5 MG tablet Take 5 mg by mouth 2 (two) times daily.     Marland Kitchen HUMALOG KWIKPEN 100 UNIT/ML KiwkPen Take 12 Units by mouth 3 (three) times daily.     Marland Kitchen lisinopril-hydrochlorothiazide (PRINZIDE,ZESTORETIC) 20-12.5 MG tablet Take 1 tablet by mouth daily.     . polycarbophil (FIBERCON) 625 MG tablet Take 625 mg by mouth daily as needed.      Scheduled:  . carvedilol  6.25 mg Oral BID  . ceFEPIme (MAXIPIME) 2 GM IVP  2 g Intravenous Q12H  . Chlorhexidine Gluconate Cloth  6 each Topical Q0600  . diltiazem  60 mg Oral Q12H  . feeding supplement (ENSURE ENLIVE)  237 mL Oral BID BM  . finasteride  5 mg Oral Daily  . insulin aspart  0-15 Units Subcutaneous TID WC  . insulin aspart  0-5 Units Subcutaneous QHS  . levothyroxine  75 mcg Oral QAC breakfast  . mupirocin ointment  1 application Nasal BID  . rosuvastatin  20 mg Oral Daily  . sodium chloride flush  10-40 mL Intracatheter Q12H  . sodium chloride flush  3 mL Intravenous Q12H  . vancomycin  1,000 mg Intravenous Q24H   Assessment: 83 y/o M with a h/o CAD, AF, CKD with recent treatment for PNA. Pharmacy consulted to dose cefepime. Vancomycin added 3/9.   Plan:  Will continue  cefepime dosing to 2 g iv q 12 hours.   Will continue Vancomycin 1000 mg iv q 24 hours. Trough level ordered to be drawn @ 21:30 on 3/13.   Pharmacy will continue to monitor and adjust per  consult.   Travarius Lange D 07/01/2016,10:24 AM

## 2016-07-01 NOTE — Progress Notes (Signed)
Pt given one unit PRBC for hemoglobin of 7.1.  Pt remains on neo-synephrine drip at 62 mcg/min.

## 2016-07-01 NOTE — Progress Notes (Signed)
Daughter refuses to wear gown and gloves for isolation

## 2016-07-02 LAB — CBC
HEMATOCRIT: 23.8 % — AB (ref 40.0–52.0)
HEMOGLOBIN: 7.9 g/dL — AB (ref 13.0–18.0)
MCH: 29.7 pg (ref 26.0–34.0)
MCHC: 33.1 g/dL (ref 32.0–36.0)
MCV: 89.8 fL (ref 80.0–100.0)
Platelets: 284 10*3/uL (ref 150–440)
RBC: 2.65 MIL/uL — ABNORMAL LOW (ref 4.40–5.90)
RDW: 15.7 % — ABNORMAL HIGH (ref 11.5–14.5)
WBC: 20.5 10*3/uL — AB (ref 3.8–10.6)

## 2016-07-02 LAB — TYPE AND SCREEN
ABO/RH(D): A POS
Antibody Screen: NEGATIVE
Unit division: 0

## 2016-07-02 LAB — GLUCOSE, CAPILLARY
GLUCOSE-CAPILLARY: 199 mg/dL — AB (ref 65–99)
Glucose-Capillary: 149 mg/dL — ABNORMAL HIGH (ref 65–99)
Glucose-Capillary: 219 mg/dL — ABNORMAL HIGH (ref 65–99)
Glucose-Capillary: 196 mg/dL — ABNORMAL HIGH (ref 65–99)

## 2016-07-02 LAB — BASIC METABOLIC PANEL
ANION GAP: 4 — AB (ref 5–15)
BUN: 63 mg/dL — ABNORMAL HIGH (ref 6–20)
CALCIUM: 7.3 mg/dL — AB (ref 8.9–10.3)
CO2: 20 mmol/L — ABNORMAL LOW (ref 22–32)
Chloride: 114 mmol/L — ABNORMAL HIGH (ref 101–111)
Creatinine, Ser: 2.07 mg/dL — ABNORMAL HIGH (ref 0.61–1.24)
GFR calc Af Amer: 32 mL/min — ABNORMAL LOW (ref >60)
GFR, EST NON AFRICAN AMERICAN: 28 mL/min — AB (ref >60)
GLUCOSE: 169 mg/dL — AB (ref 65–99)
POTASSIUM: 4.5 mmol/L (ref 3.5–5.1)
SODIUM: 138 mmol/L (ref 135–145)

## 2016-07-02 LAB — BPAM RBC
Blood Product Expiration Date: 201803232359
ISSUE DATE / TIME: 201803100012
UNIT TYPE AND RH: 600

## 2016-07-02 MED ORDER — CARVEDILOL 3.125 MG PO TABS
3.1250 mg | ORAL_TABLET | Freq: Two times a day (BID) | ORAL | Status: DC
  Administered 2016-07-02 – 2016-07-05 (×6): 3.125 mg via ORAL
  Filled 2016-07-02 (×6): qty 1

## 2016-07-02 NOTE — Clinical Social Work Note (Signed)
CSW met with the patient and his daughter at bedside to discuss bed offers. The patient's daughter reported that she would like to visit [facility] in the morning before making a firm decision, but she has narrowed it to [facility] or [facility]. CSW will con't to follow for dc planning.  Santiago Bumpers, MSW, Latanya Presser

## 2016-07-02 NOTE — Progress Notes (Signed)
[name]    MR#:  [id number]  DATE OF BIRTH:  1930/10/11  SUBJECTIVE:  CHIEF COMPLAINT:   Chief Complaint  Patient presents with  . Weakness  . Diarrhea    Patient was here with GI bleed, also had hypotension and sepsis and transferred to ICU, received 1 unit of transfusion and found to have ulcer in his stomach, also on treatment for pneumonia versus bronchitis. Hemodynamically stable now so transferred back to medical floor for further management. Patient has slight dementia but he denies any complaints, his son-in-law is present in the room who is concerned about patient's oral intake and decreased strength.  REVIEW OF SYSTEMS:  CONSTITUTIONAL: No fever,Positive for fatigue or weakness.  EYES: No blurred or double vision.  EARS, NOSE, AND THROAT: No tinnitus or ear pain.  RESPIRATORY: No cough, shortness of breath, wheezing or hemoptysis.  CARDIOVASCULAR: No chest pain, orthopnea, edema.  GASTROINTESTINAL: No nausea, vomiting, diarrhea or abdominal pain.  GENITOURINARY: No dysuria, hematuria.  ENDOCRINE: No polyuria, nocturia,  HEMATOLOGY: No anemia, easy bruising or bleeding SKIN: No rash or lesion. MUSCULOSKELETAL: No joint pain or arthritis.   NEUROLOGIC: No tingling, numbness, weakness.  PSYCHIATRY: No anxiety or depression.   ROS  DRUG ALLERGIES:  No Known Allergies  VITALS:  Blood pressure (!) 108/45, pulse 68, temperature 97.6 F (36.4 C), resp. rate 19, height 5\' 9"  (1.753 m), weight 104.3 kg (230 lb), SpO2 99 %.  PHYSICAL EXAMINATION:  GENERAL:  83 y.o.-year-old patient lying in the bed with no acute distress.  EYES: Pupils equal, round, reactive to light and accommodation. No scleral icterus. Extraocular muscles intact. Conjunctiva pale. HEENT: Head atraumatic, normocephalic. Oropharynx and nasopharynx clear.  NECK:  Supple, no jugular venous distention. No thyroid enlargement, no  tenderness.  LUNGS: Normal breath sounds bilaterally, no wheezing, Bilateral crepitation. No use of accessory muscles of respiration.  CARDIOVASCULAR: S1, S2 normal. No murmurs, rubs, or gallops.  ABDOMEN: Soft, nontender, nondistended. Bowel sounds present. No organomegaly or mass.  EXTREMITIES: No pedal edema, cyanosis, or clubbing.  NEUROLOGIC: Cranial nerves II through XII are intact. Muscle strength 4/5 in all extremities. Sensation intact. Gait not checked.  PSYCHIATRIC: The patient is alert and oriented x 2.  SKIN: No obvious rash, lesion, or ulcer.   Physical Exam LABORATORY PANEL:   CBC  Recent Labs Lab 07/02/16 0534  WBC 20.5*  HGB 7.9*  HCT 23.8*  PLT 284   ------------------------------------------------------------------------------------------------------------------  Chemistries   Recent Labs Lab 06/30/16 0419 06/30/16 2221  07/02/16 0534  NA 137 137  < > 138  K 4.5 4.6  < > 4.5  CL 112* 114*  < > 114*  CO2 17* 18*  < > 20*  GLUCOSE 119* 191*  < > 169*  BUN 68* 69*  < > 63*  CREATININE 2.12* 2.08*  < > 2.07*  CALCIUM 7.7* 7.3*  < > 7.3*  MG  --  2.3  --   --   AST 30  --   --   --   ALT 22  --   --   --   ALKPHOS 47  --   --   --   BILITOT 0.2*  --   --   --   < > = values in this interval not displayed. ------------------------------------------------------------------------------------------------------------------  Cardiac Enzymes No results for input(s): TROPONINI in the last 168 hours. ------------------------------------------------------------------------------------------------------------------  RADIOLOGY:  Dg Chest Port 1  View  Result Date: 07/01/2016 CLINICAL DATA:  Respiratory failure EXAM: PORTABLE CHEST 1 VIEW COMPARISON:  Chest x-rays dated 06/30/2016 and 06/29/2016. Also CT chest of 06/29/2016. FINDINGS: Left IJ central line appears stable in position with tip overlying the upper SVC. Stable cardiomegaly. Coarse interstitial  markings again noted bilaterally, with more confluent opacities at each lung base, not significantly changed in the interval. No new lung findings seen. No pneumothorax seen. IMPRESSION: 1. No significant interval change. Bilateral pulmonary fibrosis, with superimposed pneumonia and/or loculated pleural effusion at the left lung base better demonstrated on earlier chest CT of 06/29/2016. 2. Stable cardiomegaly. 3. Left IJ central line stable in position with tip at the level of the upper SVC. Electronically Signed   By: Franki Cabot M.D.   On: 07/01/2016 08:06    ASSESSMENT AND PLAN:   Active Problems:   Sepsis ([place])   Blood in stool   Chronic duodenal ulcer with hemorrhage   Gastritis without bleeding  * Sepsis, acute on chronic bronchitis   No finding of pneumonia on chest x-ray or CT, continue treating with antibiotic as patient had hypocalcemia tone in which is coming down now. Finish total 7 days of course.  MRSA is positive.  * Acute blood loss anemia, upper GI bleed, gastric ulcer   Received blood transfusion, hemoglobin is stable currently, he may need oral iron therapy on discharge.  * Acute kidney injury on chronic kidney stage disease 3   Continue monitoring, stable.  * Obstructive sleep apnea, underlying pulmonary fibrosis   Loculated pleural effusion, small.    Currently stable, we may need to check on oxygen requirement on discharge, CPAP use at nighttime which is chronic.  * Paroxysmal atrial fibrillation   Chronic angulation use before admission, we may need to reconsider the starting date at the time of discharge because of GI bleed.   All the records are reviewed and case discussed with Care Management/Social Workerr. Management plans discussed with the patient, family and they are in agreement.  CODE STATUS: Full code.  TOTAL TIME TAKING CARE OF THIS PATIENT: 35 minutes.  Discussed with patient's son who was present at the time of visit.  POSSIBLE D/C IN1-2  DAYS, DEPENDING ON CLINICAL CONDITION.   Vaughan Basta M.D on 07/02/2016   Between 7am to 6pm - Pager - [phone]  After 6pm go to [email or web address] - password [email or web address]  Office  [phone]  CC: Primary care physician; Pcp Not In System  Note: This dictation was prepared with Dragon dictation along with smaller phrase technology. Any transcriptional errors that result from this process are unintentional.

## 2016-07-03 ENCOUNTER — Inpatient Hospital Stay: Payer: Medicare Other

## 2016-07-03 LAB — GLUCOSE, PLEURAL OR PERITONEAL FLUID: Glucose, Fluid: 201 mg/dL

## 2016-07-03 LAB — BODY FLUID CELL COUNT WITH DIFFERENTIAL
EOS FL: 5 %
LYMPHS FL: 18 %
MONOCYTE-MACROPHAGE-SEROUS FLUID: 0 %
NEUTROPHIL FLUID: 77 %
Other Cells, Fluid: 0 %
WBC FLUID: 1436 uL

## 2016-07-03 LAB — GLUCOSE, CAPILLARY
GLUCOSE-CAPILLARY: 272 mg/dL — AB (ref 65–99)
GLUCOSE-CAPILLARY: 149 mg/dL — AB (ref 65–99)
Glucose-Capillary: 169 mg/dL — ABNORMAL HIGH (ref 65–99)
Glucose-Capillary: 164 mg/dL — ABNORMAL HIGH (ref 65–99)

## 2016-07-03 LAB — PROCALCITONIN: Procalcitonin: 0.17 ng/mL

## 2016-07-03 LAB — PROTEIN, PLEURAL OR PERITONEAL FLUID: TOTAL PROTEIN, FLUID: 3.3 g/dL

## 2016-07-03 LAB — LACTATE DEHYDROGENASE, PLEURAL OR PERITONEAL FLUID: LD, Fluid: 402 U/L — ABNORMAL HIGH (ref 3–23)

## 2016-07-03 MED ORDER — PANTOPRAZOLE SODIUM 40 MG PO TBEC
40.0000 mg | DELAYED_RELEASE_TABLET | Freq: Two times a day (BID) | ORAL | Status: DC
  Administered 2016-07-03 – 2016-07-05 (×5): 40 mg via ORAL
  Filled 2016-07-03 (×5): qty 1

## 2016-07-03 NOTE — Progress Notes (Signed)
[facility] [place] NAME: Glenn Reeves    MR#:  [id number]  DATE OF BIRTH:  [birth year]  SUBJECTIVE:  CHIEF COMPLAINT:   Chief Complaint  Patient presents with  . Weakness  . Diarrhea    Patient very weak at bedside White blood cell count continues to be elevated  REVIEW OF SYSTEMS:  CONSTITUTIONAL: No fever,Positive for fatigue or weakness.  EYES: No blurred or double vision.  EARS, NOSE, AND THROAT: No tinnitus or ear pain.  RESPIRATORY: No cough, shortness of breath, wheezing or hemoptysis.  CARDIOVASCULAR: No chest pain, orthopnea, edema.  GASTROINTESTINAL: No nausea, vomiting, diarrhea or abdominal pain.  GENITOURINARY: No dysuria, hematuria.  ENDOCRINE: No polyuria, nocturia,  HEMATOLOGY: No anemia, easy bruising or bleeding SKIN: No rash or lesion. MUSCULOSKELETAL: No joint pain or arthritis.   NEUROLOGIC: No tingling, numbness, weakness.  PSYCHIATRY: No anxiety or depression.   ROS  DRUG ALLERGIES:  No Known Allergies  VITALS:  Blood pressure (!) 89/53, pulse 85, temperature 97.3 F (36.3 C), temperature source Oral, resp. rate 18, height 5\' 9"  (1.753 m), weight 229 lb 9.6 oz (104.1 kg), SpO2 98 %.  PHYSICAL EXAMINATION:  GENERAL:  83 y.o.-year-old patient lying in the bed with no acute distress.  EYES: Pupils equal, round, reactive to light and accommodation. No scleral icterus. Extraocular muscles intact. Conjunctiva pale. HEENT: Head atraumatic, normocephalic. Oropharynx and nasopharynx clear.  NECK:  Supple, no jugular venous distention. No thyroid enlargement, no tenderness.  LUNGS: Normal breath sounds bilaterally, no wheezing, Bilateral crepitation. No use of accessory muscles of respiration.  CARDIOVASCULAR: S1, S2 normal. No murmurs, rubs, or gallops.  ABDOMEN: Soft, nontender, nondistended. Bowel sounds present. No organomegaly or mass.  EXTREMITIES: No pedal edema, cyanosis, or clubbing.  NEUROLOGIC:  Cranial nerves II through XII are intact. Muscle strength 4/5 in all extremities. Sensation intact. Gait not checked.  PSYCHIATRIC: The patient is alert and oriented x 2.  SKIN: No obvious rash, lesion, or ulcer.   Physical Exam LABORATORY PANEL:   CBC  Recent Labs Lab 07/02/16 0534  WBC 20.5*  HGB 7.9*  HCT 23.8*  PLT 284   ------------------------------------------------------------------------------------------------------------------  Chemistries   Recent Labs Lab 06/30/16 0419 06/30/16 2221  07/02/16 0534  NA 137 137  < > 138  K 4.5 4.6  < > 4.5  CL 112* 114*  < > 114*  CO2 17* 18*  < > 20*  GLUCOSE 119* 191*  < > 169*  BUN 68* 69*  < > 63*  CREATININE 2.12* 2.08*  < > 2.07*  CALCIUM 7.7* 7.3*  < > 7.3*  MG  --  2.3  --   --   AST 30  --   --   --   ALT 22  --   --   --   ALKPHOS 47  --   --   --   BILITOT 0.2*  --   --   --   < > = values in this interval not displayed. ------------------------------------------------------------------------------------------------------------------  Cardiac Enzymes No results for input(s): TROPONINI in the last 168 hours. ------------------------------------------------------------------------------------------------------------------  RADIOLOGY:  Dg Chest Port 1 View  Result Date: 07/03/2016 CLINICAL DATA:  Status post thoracentesis EXAM: PORTABLE CHEST 1 VIEW COMPARISON:  07/01/2016 FINDINGS: There is no evidence of pneumothorax post left thoracentesis. Lungs remain under aerated. Fibrotic pulmonary opacities are seen bilaterally. Upper normal heart size. Left jugular venous catheter is stable. IMPRESSION: No pneumothorax post thoracentesis. Electronically  Signed   By: Marybelle Killings M.D.   On: 07/03/2016 14:54   US Thoracentesis Asp Pleural Space W/img Guide  Result Date: 07/03/2016 INDICATION: No pleural effusion EXAM: ULTRASOUND GUIDED LEFT THORACENTESIS MEDICATIONS: None. COMPLICATIONS: None immediate. PROCEDURE: An  ultrasound guided thoracentesis was thoroughly discussed with the patient and questions answered. The benefits, risks, alternatives and complications were also discussed. The patient understands and wishes to proceed with the procedure. Written consent was obtained. Ultrasound was performed to localize and mark an adequate pocket of fluid in the left chest. The area was then prepped and draped in the normal sterile fashion. 1% Lidocaine was used for local anesthesia. Under ultrasound guidance a Safe-T-Centesis catheter was introduced. Thoracentesis was performed. The catheter was removed and a dressing applied. FINDINGS: A total of approximately 750 cc of serosanguineous fluid was removed. Samples were sent to the laboratory as requested by the clinical team. IMPRESSION: Successful ultrasound guided left thoracentesis yielding 750 cc of pleural fluid. Electronically Signed   By: Marybelle Killings M.D.   On: 07/03/2016 14:56    ASSESSMENT AND PLAN:   Active Problems:   Sepsis ([place])   Blood in stool   Chronic duodenal ulcer with hemorrhage   Gastritis without bleeding  * Sepsis, acute on chronic bronchitis   No finding of pneumonia on chest x-ray or CT, continue treating with antibiotic as patient had hypocalcemia tone in which is coming down now. Speech evaluation  MRSA is positive. I have discuss with pulmonary regarding the fluid collection we will have this drained by radiology  * Acute blood loss anemia, upper GI bleed, gastric ulcer   Received blood transfusion, hemoglobin is stable currently, he may need oral iron therapy on discharge.  * Acute kidney injury on chronic kidney stage disease 3   Continue monitoring, stable.  * Obstructive sleep apnea, underlying pulmonary fibrosis   Loculated pleural effusion, small.    Currently stable, we may need to check on oxygen requirement on discharge, CPAP use at nighttime which is chronic.  * Paroxysmal atrial fibrillation   Chronic angulation  use before admission, we may need to reconsider the starting date at the time of discharge because of GI bleed.   All the records are reviewed and case discussed with Care Management/Social Workerr. Management plans discussed with the patient, family and they are in agreement.  CODE STATUS: Full code.  TOTAL TIME TAKING CARE OF THIS PATIENT: 32 minutes.  Discussed with patient's son who was present at the time of visit.  POSSIBLE D/C IN1-2 DAYS, DEPENDING ON CLINICAL CONDITION.   Dustin Flock M.D on 07/03/2016   Between 7am to 6pm - Pager - [phone]  After 6pm go to [email or web address] - password [email or web address] [facility]  Office  [phone]  CC: Primary care physician; Pcp Not In System  Note: This dictation was prepared with Dragon dictation along with smaller phrase technology. Any transcriptional errors that result from this process are unintentional.

## 2016-07-03 NOTE — Progress Notes (Signed)
Physical Therapy Treatment Patient Details Name: Glenn Reeves MRN: [id number] DOB: 10-27-1930 Today's Date: 07/03/2016    History of Present Illness Pt is an 83 yo male admitted to [facility] due to weakness, progressive SOB, fall and diarrhea, diagnosed with sepsis with bibasilar pneumonia. PMH includes; CAD, A-fib, HTN, CKD III, Pneumonia, UTI, DM, chronic back pain and GI bleed. Pt now with complicated hospital stay secondary to spontaneous GI bleed, rapid response event called and intubation on 3/8 secondary to EGD. Pt extubated later on 3/8, however has difficulty keepings sats WNL while on room air. Pt re-evaluated this date.    PT Comments    Pt awake, alert and wanting to get out of bed. Pt demonstrated decreased strength and required max assist for bed mobility and transfers. Pt is unsteady in standing and a high fall risk requiring a RW and hands on assist +2 to prevent LOB. He ambulated to the chair but required frequent cuing and mod assist, presented w/ shuffle gait step to gait pattern. Pt demonstrates decreased strength and activity tolerance that limit safe functional mobility he will continue to benefit from skilled PT, recommend he transition to STR following hospital stay.    Follow Up Recommendations  SNF     Equipment Recommendations  None recommended by PT    Recommendations for Other Services OT consult     Precautions / Restrictions Precautions Precautions: Fall Restrictions Weight Bearing Restrictions: No    Mobility  Bed Mobility Overal bed mobility: Needs Assistance Bed Mobility: Supine to Sit     Supine to sit: Max assist     General bed mobility comments: pt required max assist to advance LE, to bring trunk into upright position and to scoot to EOB,   Transfers Overall transfer level: Needs assistance Equipment used: Rolling walker (2 wheeled) Transfers: Sit to/from Stand Sit to Stand: Max assist;+2 physical assistance         General  transfer comment: demonstrated decrease strength and required max assist +2 to move to transfer to standing, cuing for hand placement and feet position   Ambulation/Gait Ambulation/Gait assistance: Mod assist;+2 physical assistance Ambulation Distance (Feet): 3 Feet Assistive device: Rolling walker (2 wheeled) Gait Pattern/deviations: Step-to pattern;Decreased step length - right;Decreased step length - left;Shuffle;Narrow base of support;Trunk flexed   Gait velocity interpretation: <1.8 ft/sec, indicative of risk for recurrent falls General Gait Details: pt required mod assist and hands on w/ frequent verbal cues to ambulate to chair, demonstrated decrease strength and stability in stance    Stairs            Wheelchair Mobility    Modified Rankin (Stroke Patients Only)       Balance Overall balance assessment: Needs assistance Sitting-balance support: Bilateral upper extremity supported;Feet supported Sitting balance-Leahy Scale: Fair Sitting balance - Comments: required bilat UE support to maintain sitting w/o back support, SBA required for safety Postural control: Posterior lean Standing balance support: Bilateral upper extremity supported Standing balance-Leahy Scale: Poor Standing balance comment: requries use of RW for stability and hands on assist +2 to prevent LOB                    Cognition Arousal/Alertness: Awake/alert Behavior During Therapy: WFL for tasks assessed/performed Overall Cognitive Status: Within Functional Limits for tasks assessed                      Exercises      General Comments  Pertinent Vitals/Pain Pain Assessment: No/denies pain    Home Living                      Prior Function            PT Goals (current goals can now be found in the care plan section) Acute Rehab PT Goals Patient Stated Goal: To get better and return home PT Goal Formulation: With patient Time For Goal Achievement:  07/14/16 Potential to Achieve Goals: Fair Progress towards PT goals: Progressing toward goals    Frequency    Min 2X/week      PT Plan Current plan remains appropriate    Co-evaluation             End of Session Equipment Utilized During Treatment: Gait belt Activity Tolerance: Patient limited by fatigue Patient left: in chair;with call bell/phone within reach;with family/visitor present;with chair alarm set Nurse Communication: Mobility status PT Visit Diagnosis: Unsteadiness on feet (R26.81);Muscle weakness (generalized) (M62.81);History of falling (Z91.81);Difficulty in walking, not elsewhere classified (R26.2)     Time: 9935-7017 PT Time Calculation (min) (ACUTE ONLY): 24 min  Charges:                       G Codes:       [employer] Student PT 07/03/2016, 12:18 PM

## 2016-07-03 NOTE — Clinical Social Work Note (Addendum)
CSW spoke to patient's daughter [name] who was at bedside.  Paitent's daughter would like patient to go to [facility] for short term rehab once he is medically ready for discharge and orders have been received.  CSW contacted [facility], and left message awaiting for call back to confirm they can accept patient once he is ready for discharge.  CSW to continue to follow patient's progress throughout discharge planning.  12:45pm CSW received phone call back from [facility] who said they can accept patient once he is medically ready for discharge and orders have been received.  Jones Broom. Norval Morton, MSW, [place]  07/03/2016 12:37 PM

## 2016-07-03 NOTE — Progress Notes (Signed)
SLP Cancellation Note  Patient Details Name: Glenn Reeves MRN: [id number] DOB: [birth year]   Cancelled treatment:       Reason Eval/Treat Not Completed: SLP screened, no needs identified, will sign off (chart reviewed; consulted NSG, then pt and Daughter). Daughter and pt denied any swallowing difficulty; pt does not have much appetite at this time per report and has been drinking more Ensure. Pt does have Reflux baseline and exhibits s/s of Reflux per Daughter's description(ie, belching, fullness). Discussed briefly s/s of aspiration, dysphagia; also ways to monitor for such and reduce risk such as using applesauce to swallow Pills, no straws if coughing when using, sitting upright to eat/drink. Daughter will continue to monitor for need for ST services(BSE) but did not think a BSE was needed at this time. Will f/u tomorrow; NSG updated.    Orinda Kenner, MS, CCC-SLP Azzam Mehra 07/03/2016, 3:43 PM

## 2016-07-03 NOTE — Consult Note (Signed)
[facility] Infectious Disease     Reason for Consult: Leukocytosis   Referring Physician: Eliane Decree Date of Admission:  06/26/2016   Active Problems:   Sepsis (HCC)   Blood in stool   Chronic duodenal ulcer with hemorrhage   Gastritis without bleeding   HPI: Glenn Reeves is a 83 y.o. male admitted a week ago with weakness, a fall and diarrhea. He on admit had wbc 22 and Temp 98.  He was started  On cefepime, flagyl and vanco. Changed to just cefepime 3/6 and vanco added back 3/9.  He has had a complicated course with A fib with RVR and an UGIB with duodenal ulcer. Currently out if ICU and on floor. His wbc remains elevated at 20 but no fevers and has been HD stable.  BCX from admit neg, UA on admit TNTC WBC and UCX neg. He had neg C diff on admit and MRSA PCR +.   Initial xray showed pulmonary fibrosis with increased opacification on L than R. He had CT chest 3/8 with a L locuated pleural effusion and findings of severe ILD. He underwent thoracentesis 3/12 Currently his daughter is at is bedside and gives much of the hx. He was admitted to [facility] hospital last month and told had IPF at that time. At last visit with MD in [facility] prior to moving here his wbc was 14.  Past Medical History:  Diagnosis Date  . Atrial fibrillation (HCC)   . CAD (coronary artery disease)   . Diabetes mellitus without complication (HCC)   . GI bleed   . Hypertension   . Thyroid disease    Past Surgical History:  Procedure Laterality Date  . ESOPHAGOGASTRODUODENOSCOPY (EGD) WITH PROPOFOL N/A 06/29/2016   Procedure: ESOPHAGOGASTRODUODENOSCOPY (EGD) WITH PROPOFOL;  Surgeon: Midge Minium, MD;  Location: [facility] ENDOSCOPY;  Service: Endoscopy;  Laterality: N/A;   Social History  Substance Use Topics  . Smoking status: Never Smoker  . Smokeless tobacco: Never Used  . Alcohol use No   Family History  Problem Relation Age of Onset  . Autoimmune disease Neg Hx     Allergies: No Known Allergies  Current  antibiotics: Antibiotics Given (last 72 hours)    Date/Time Action Medication Dose Rate   06/30/16 2216 Given   ceFEPIme (MAXIPIME) 2 g in dextrose 5 % 50 mL IVPB 2 g 100 mL/hr   06/30/16 2255 Given   vancomycin (VANCOCIN) IVPB 1000 mg/200 mL premix 1,000 mg 200 mL/hr   07/01/16 0842 Given   ceFEPIme (MAXIPIME) 2 g in dextrose 5 % 50 mL IVPB 2 g 100 mL/hr   07/01/16 2149 Given   vancomycin (VANCOCIN) IVPB 1000 mg/200 mL premix 1,000 mg 200 mL/hr   07/01/16 2149 Given   ceFEPIme (MAXIPIME) 2 g in dextrose 5 % 50 mL IVPB 2 g 100 mL/hr   07/02/16 1400 Given   ceFEPIme (MAXIPIME) 2 g in dextrose 5 % 50 mL IVPB 2 g 100 mL/hr   07/02/16 2155 Given   ceFEPIme (MAXIPIME) 2 g in dextrose 5 % 50 mL IVPB 2 g 100 mL/hr   07/02/16 2253 Given   vancomycin (VANCOCIN) IVPB 1000 mg/200 mL premix 1,000 mg 200 mL/hr   07/03/16 1317 Given   ceFEPIme (MAXIPIME) 2 g in dextrose 5 % 50 mL IVPB 2 g 100 mL/hr      MEDICATIONS: . carvedilol  3.125 mg Oral BID  . ceFEPIme (MAXIPIME) 2 GM IVP  2 g Intravenous Q12H  . Chlorhexidine Gluconate  Cloth  6 each Topical Q0600  . diltiazem  60 mg Oral Q12H  . feeding supplement (ENSURE ENLIVE)  237 mL Oral BID BM  . finasteride  5 mg Oral Daily  . insulin aspart  0-15 Units Subcutaneous TID WC  . insulin aspart  0-5 Units Subcutaneous QHS  . levothyroxine  75 mcg Oral QAC breakfast  . mupirocin ointment  1 application Nasal BID  . pantoprazole  40 mg Oral BID  . rosuvastatin  20 mg Oral Daily  . sodium chloride flush  10-40 mL Intracatheter Q12H  . sodium chloride flush  3 mL Intravenous Q12H  . vancomycin  1,000 mg Intravenous Q24H    Review of Systems - 11 systems reviewed and negative per HPI   OBJECTIVE: Temp:  [97.3 F (36.3 C)-98.4 F (36.9 C)] 97.3 F (36.3 C) (03/12 1124) Pulse Rate:  [45-85] 85 (03/12 1436) Resp:  [18] 18 (03/12 1436) BP: (85-127)/(39-61) 89/53 (03/12 1436) SpO2:  [94 %-98 %] 98 % (03/12 1436) Weight:  [104.1 kg (229 lb 9.6  oz)] 104.1 kg (229 lb 9.6 oz) (03/12 0506) Physical Exam  Constitutional: frail, weak, unable to sit up without assistance. Coughing  HENT: anicteric Mouth/Throat: Oropharynx is clear and dry . No oropharyngeal exudate.  Cardiovascular: Normal rate, regular rhythm and normal heart sounds.  Pulmonary/Chest: bilataral crackles Abdominal: Soft. Bowel sounds are normal. He exhibits no distension. There is no tenderness.  Lymphadenopathy: He has no cervical adenopathy.  Neurological: awakea and interactive  Skin: some brusing, no wound Psychiatric: He has a normal mood and affect. His behavior is normal.     LABS: Results for orders placed or performed during the hospital encounter of 06/26/16 (from the past 48 hour(s))  Glucose, capillary     Status: Abnormal   Collection Time: 07/01/16  9:19 PM  Result Value Ref Range   Glucose-Capillary 132 (H) 65 - 99 mg/dL   Comment 1 Notify RN   Basic metabolic panel     Status: Abnormal   Collection Time: 07/02/16  5:34 AM  Result Value Ref Range   Sodium 138 135 - 145 mmol/L   Potassium 4.5 3.5 - 5.1 mmol/L   Chloride 114 (H) 101 - 111 mmol/L   CO2 20 (L) 22 - 32 mmol/L   Glucose, Bld 169 (H) 65 - 99 mg/dL   BUN 63 (H) 6 - 20 mg/dL   Creatinine, Ser 2.07 (H) 0.61 - 1.24 mg/dL   Calcium 7.3 (L) 8.9 - 10.3 mg/dL   GFR calc non Af Amer 28 (L) >60 mL/min   GFR calc Af Amer 32 (L) >60 mL/min    Comment: (NOTE) The eGFR has been calculated using the CKD EPI equation. This calculation has not been validated in all clinical situations. eGFR's persistently <60 mL/min signify possible Chronic Kidney Disease.    Anion gap 4 (L) 5 - 15  CBC     Status: Abnormal   Collection Time: 07/02/16  5:34 AM  Result Value Ref Range   WBC 20.5 (H) 3.8 - 10.6 K/uL   RBC 2.65 (L) 4.40 - 5.90 MIL/uL   Hemoglobin 7.9 (L) 13.0 - 18.0 g/dL   HCT 23.8 (L) 40.0 - 52.0 %   MCV 89.8 80.0 - 100.0 fL   MCH 29.7 26.0 - 34.0 pg   MCHC 33.1 32.0 - 36.0 g/dL   RDW  15.7 (H) 11.5 - 14.5 %   Platelets 284 150 - 440 K/uL  Glucose, capillary  Status: Abnormal   Collection Time: 07/02/16  7:40 AM  Result Value Ref Range   Glucose-Capillary 149 (H) 65 - 99 mg/dL  Glucose, capillary     Status: Abnormal   Collection Time: 07/02/16 11:47 AM  Result Value Ref Range   Glucose-Capillary 199 (H) 65 - 99 mg/dL  Glucose, capillary     Status: Abnormal   Collection Time: 07/02/16  5:05 PM  Result Value Ref Range   Glucose-Capillary 219 (H) 65 - 99 mg/dL  Glucose, capillary     Status: Abnormal   Collection Time: 07/02/16  9:30 PM  Result Value Ref Range   Glucose-Capillary 196 (H) 65 - 99 mg/dL   Comment 1 Notify RN    Comment 2 Document in Chart   Glucose, capillary     Status: Abnormal   Collection Time: 07/03/16  7:46 AM  Result Value Ref Range   Glucose-Capillary 169 (H) 65 - 99 mg/dL   Comment 1 Notify RN   Glucose, capillary     Status: Abnormal   Collection Time: 07/03/16 11:20 AM  Result Value Ref Range   Glucose-Capillary 164 (H) 65 - 99 mg/dL   Comment 1 Notify RN   Lactate dehydrogenase (CSF, pleural or peritoneal fluid)     Status: Abnormal   Collection Time: 07/03/16  2:25 PM  Result Value Ref Range   LD, Fluid 402 (H) 3 - 23 U/L    Comment: (NOTE) Results should be evaluated in conjunction with serum values    Fluid Type-FLDH CYTOPLEU   Body fluid cell count with differential     Status: Abnormal   Collection Time: 07/03/16  2:25 PM  Result Value Ref Range   Fluid Type-FCT CYTOPLEU    Color, Fluid RED (A) YELLOW   Appearance, Fluid BLOODY (A) CLEAR   WBC, Fluid 1,436 cu mm   Neutrophil Count, Fluid 77 %   Lymphs, Fluid 18 %   Monocyte-Macrophage-Serous Fluid 0 %   Eos, Fluid 5 %   Other Cells, Fluid 0 %  Protein, pleural or peritoneal fluid     Status: None   Collection Time: 07/03/16  2:25 PM  Result Value Ref Range   Total protein, fluid 3.3 g/dL    Comment: (NOTE) No normal range established for this test Results  should be evaluated in conjunction with serum values    Fluid Type-FTP CYTOPLEU   Glucose, plural or peritoneal fluid     Status: None   Collection Time: 07/03/16  2:25 PM  Result Value Ref Range   Glucose, Fluid 201 mg/dL    Comment: (NOTE) No normal range established for this test Results should be evaluated in conjunction with serum values    Fluid Type-FGLU CYTOPLEU   Glucose, capillary     Status: Abnormal   Collection Time: 07/03/16  4:51 PM  Result Value Ref Range   Glucose-Capillary 272 (H) 65 - 99 mg/dL   Comment 1 Notify RN    No components found for: ESR, C REACTIVE PROTEIN MICRO: Recent Results (from the past 720 hour(s))  C difficile quick scan w PCR reflex     Status: None   Collection Time: 06/26/16  7:23 PM  Result Value Ref Range Status   C Diff antigen NEGATIVE NEGATIVE Final   C Diff toxin NEGATIVE NEGATIVE Final   C Diff interpretation No C. difficile detected.  Final  Blood Culture (routine x 2)     Status: None   Collection Time: 06/26/16  7:50 PM  Result Value Ref Range Status   Specimen Description BLOOD L AC  Final   Special Requests BOTTLES DRAWN AEROBIC AND ANAEROBIC BCAV  Final   Culture NO GROWTH 5 DAYS  Final   Report Status 07/01/2016 FINAL  Final  Blood Culture (routine x 2)     Status: None   Collection Time: 06/26/16  7:50 PM  Result Value Ref Range Status   Specimen Description BLOOD  R AC  Final   Special Requests BOTTLES DRAWN AEROBIC AND ANAEROBIC BCAV  Final   Culture NO GROWTH 5 DAYS  Final   Report Status 07/01/2016 FINAL  Final  Urine culture     Status: None   Collection Time: 06/27/16  9:03 AM  Result Value Ref Range Status   Specimen Description URINE, RANDOM  Final   Special Requests NONE  Final   Culture   Final    NO GROWTH Performed at [facility], [address] [place], [place] [address]    Report Status 06/28/2016 FINAL  Final  MRSA PCR Screening     Status: Abnormal   Collection Time: 06/27/16  8:36 PM   Result Value Ref Range Status   MRSA by PCR POSITIVE (A) NEGATIVE Final    Comment:        The GeneXpert MRSA Assay (FDA approved for NASAL specimens only), is one component of a comprehensive MRSA colonization surveillance program. It is not intended to diagnose MRSA infection nor to guide or monitor treatment for MRSA infections. RESULT CALLED TO, READ BACK BY AND VERIFIED WITH: TAKERA MESNICT 06/27/16 @ 2148  MLK     IMAGING: Dg Chest 2 View  Result Date: 06/26/2016 CLINICAL DATA:  Weakness for 3 days EXAM: CHEST  2 VIEW COMPARISON:  04/18/2006 FINDINGS: Fibrotic changes at the left greater than right lung bases. Tiny pleural effusions or thickening. Increased opacity at the left greater than right lung base suspicious for acute inflammatory process superimposed on chronic change. Mild cardiomegaly with atherosclerosis. No pneumothorax. Surgical clips at the left neck IMPRESSION: 1. Reticular opacities within the left greater than right bases suggestive of fibrosis. More confluent opacification at the left greater than right lung base is suspicious for superimposed acute inflammation or pneumonia. Small left pleural effusion or thickening 2. Cardiomegaly. Electronically Signed   By: Jasmine Pang M.D.   On: 06/26/2016 19:10   Ct Head Wo Contrast  Result Date: 06/26/2016 CLINICAL DATA:  Weakness, status post fall, abrasions EXAM: CT HEAD WITHOUT CONTRAST TECHNIQUE: Contiguous axial images were obtained from the base of the skull through the vertex without intravenous contrast. COMPARISON:  None. FINDINGS: Patient motion degrades image quality limiting evaluation. Brain: No evidence of acute infarction, hemorrhage, extra-axial collection, ventriculomegaly, or mass effect. Generalized cerebral atrophy. Periventricular white matter low attenuation likely secondary to microangiopathy. Vascular: Cerebrovascular atherosclerotic calcifications are noted. Skull: Negative for fracture or focal lesion.  Sinuses/Orbits: Visualized portions of the orbits are unremarkable. Visualized portions of the paranasal sinuses and mastoid air cells are unremarkable. Other: None. IMPRESSION: No acute intracranial pathology. Electronically Signed   By: Elige Ko   On: 06/26/2016 19:27   Ct Chest Wo Contrast  Result Date: 06/29/2016 CLINICAL DATA:  Respiratory failure and cough, generalized weakness, diarrhea, recent fall EXAM: CT CHEST WITHOUT CONTRAST TECHNIQUE: Multidetector CT imaging of the chest was performed following the standard protocol without IV contrast. COMPARISON:  Portable chest x-ray of 06/29/2016 and 06/26/2016 FINDINGS: Cardiovascular: On this unenhanced study, there is significant thoracic  aortic atherosclerosis present. The mid ascending thoracic aorta measures 43 mm in diameter. Recommend annual imaging followup by CTA or MRA. This recommendation follows 2010 ACCF/AHA/AATS/ACR/ASA/SCA/SCAI/SIR/STS/SVM Guidelines for the Diagnosis and Management of Patients with Thoracic Aortic Disease. Circulation. 2010; 121: J825-K539. There is cardiomegaly present. Diffuse coronary artery calcifications are noted. No pericardial effusion is seen. Mediastinum/Nodes: On this unenhanced study, no mediastinal or hilar adenopathy is noted Lungs/Pleura: There peripheral opacity at the left lung base on chest x-ray appears to represent loculated left pleural effusion. However, throughout the lungs there are marked changes of interstitial lung disease. Considerable honeycombing is present in the lung bases. Diffuse areas of fibrosis are present and there is some traction bronchiectasis within the lingula. The majority of the findings are peripheral and subpleural with a lower lobe predominance, typical of idiopathic pulmonary fibrosis. No definite pneumonia is seen. The central airway is patent. Upper Abdomen: Multiple splenic and hepatic calcified granulomas are present secondary to prior granulomatous disease. Surgical  clips are noted from prior cholecystectomy. Musculoskeletal: The thoracic vertebrae are slightly kyphotic with diffuse degenerative change present throughout the thoracic spine. No compression deformity is seen. IMPRESSION: 1. Severe changes of interstitial lung disease as described above, most consistent with idiopathic pulmonary fibrosis. 2. Fusiform dilatation of the mid ascending thoracic aorta measuring 43 mm in diameter. Recommend annual imaging followup by CTA or MRA. This recommendation follows 2010 ACCF/AHA/AATS/ACR/ASA/SCA/SCAI/SIR/STS/SVM Guidelines for the Diagnosis and Management of Patients with Thoracic Aortic Disease. Circulation. 2010; 121: J673-A193. 3. Diffuse coronary artery calcifications. 4. Changes of prior granulomatous disease. Electronically Signed   By: Ivar Drape M.D.   On: 06/29/2016 11:53   Dg Chest Port 1 View  Result Date: 07/03/2016 CLINICAL DATA:  Status post thoracentesis EXAM: PORTABLE CHEST 1 VIEW COMPARISON:  07/01/2016 FINDINGS: There is no evidence of pneumothorax post left thoracentesis. Lungs remain under aerated. Fibrotic pulmonary opacities are seen bilaterally. Upper normal heart size. Left jugular venous catheter is stable. IMPRESSION: No pneumothorax post thoracentesis. Electronically Signed   By: Marybelle Killings M.D.   On: 07/03/2016 14:54   Dg Chest Port 1 View  Result Date: 07/01/2016 CLINICAL DATA:  Respiratory failure EXAM: PORTABLE CHEST 1 VIEW COMPARISON:  Chest x-rays dated 06/30/2016 and 06/29/2016. Also CT chest of 06/29/2016. FINDINGS: Left IJ central line appears stable in position with tip overlying the upper SVC. Stable cardiomegaly. Coarse interstitial markings again noted bilaterally, with more confluent opacities at each lung base, not significantly changed in the interval. No new lung findings seen. No pneumothorax seen. IMPRESSION: 1. No significant interval change. Bilateral pulmonary fibrosis, with superimposed pneumonia and/or loculated  pleural effusion at the left lung base better demonstrated on earlier chest CT of 06/29/2016. 2. Stable cardiomegaly. 3. Left IJ central line stable in position with tip at the level of the upper SVC. Electronically Signed   By: Franki Cabot M.D.   On: 07/01/2016 08:06   Dg Chest Port 1 View  Result Date: 06/30/2016 CLINICAL DATA:  Acute respiratory failure. Sepsis. Atrial fibrillation. EXAM: PORTABLE CHEST 1 VIEW COMPARISON:  06/29/2016 FINDINGS: Endotracheal tube has been removed. A left jugular central venous catheter remains in appropriate position. Patient is rotated to the right. Bibasilar predominant pulmonary interstitial fibrosis in left pleural thickening remain stable. No evidence of superimposed pulmonary infiltrate. No evidence of pneumothorax. IMPRESSION: Stable bibasilar predominant pulmonary fibrosis and left pleural thickening. No acute findings. Electronically Signed   By: Earle Gell M.D.   On: 06/30/2016 09:04   Dg Chest  Port 1 View  Result Date: 06/29/2016 CLINICAL DATA:  Endotracheal tube and central line placement. EXAM: PORTABLE CHEST 1 VIEW COMPARISON:  Chest CT June 29, 2016 at 11:18 hours FINDINGS: Endotracheal tube tip projects 3 cm above the carina. LEFT internal jugular central venous catheter tip projects in proximal superior vena cava. Stable cardiomegaly. Diffusely coarsened pulmonary interstitium with pleural effusions/pleural thickening, unchanged. No pneumothorax. Surgical clips project in the RIGHT abdomen. Soft tissue planes and included osseous structures are unchanged. IMPRESSION: Endotracheal tube tip projects 3 cm above the carina. LEFT internal jugular central venous catheter tip projects in proximal superior vena cava. No pneumothorax. Stable cardiomegaly, pulmonary fibrosis and pleural thickening. Electronically Signed   By: Awilda Metro M.D.   On: 06/29/2016 15:38   Dg Chest Port 1 View  Result Date: 06/29/2016 CLINICAL DATA:  Respiratory failure. EXAM:  PORTABLE CHEST 1 VIEW COMPARISON:  06/26/2016 FINDINGS: The cardiomediastinal silhouette is unchanged. Lung volumes remain diminished with mild elevation of the right hemidiaphragm. Basilar predominant reticular densities, with asymmetric involvement of the left lung base, are similar to the prior study and suggestive of fibrosis. Patchy left basilar opacity is stable to minimally increased, and a small left pleural effusion also appears increased and may be partially loculated or associated with pleural thickening. No pneumothorax is identified. Surgical clips are present in the left neck. IMPRESSION: Background lung changes suggestive of fibrosis with stable to slightly increased asymmetric left basilar opacity which may reflect superimposed pneumonia. Small left pleural effusion, slightly increased. Electronically Signed   By: Sebastian Ache M.D.   On: 06/29/2016 09:37   US Thoracentesis Asp Pleural Space W/img Guide  Result Date: 07/03/2016 INDICATION: No pleural effusion EXAM: ULTRASOUND GUIDED LEFT THORACENTESIS MEDICATIONS: None. COMPLICATIONS: None immediate. PROCEDURE: An ultrasound guided thoracentesis was thoroughly discussed with the patient and questions answered. The benefits, risks, alternatives and complications were also discussed. The patient understands and wishes to proceed with the procedure. Written consent was obtained. Ultrasound was performed to localize and mark an adequate pocket of fluid in the left chest. The area was then prepped and draped in the normal sterile fashion. 1% Lidocaine was used for local anesthesia. Under ultrasound guidance a Safe-T-Centesis catheter was introduced. Thoracentesis was performed. The catheter was removed and a dressing applied. FINDINGS: A total of approximately 750 cc of serosanguineous fluid was removed. Samples were sent to the laboratory as requested by the clinical team. IMPRESSION: Successful ultrasound guided left thoracentesis yielding 750 cc of  pleural fluid. Electronically Signed   By: Jolaine Click M.D.   On: 07/03/2016 14:56    Assessment:   Glenn Reeves is a 83 y.o. male with recently dxed severe  IPF, recent admission for PNA in [facility] hospital readmitted with weakness and elevated wbc. Course complicated by UGIB and persistent leukocytosis as well as pleural effusion tapped today. He has no fevers. Neg BCX, UCX. No sputum cx done.  HD stable Has been on cefepime and vanco.  Likely multifactorial causes of his elevate wbc. So far no evidence occult abscess but has not had abd imaging.  He has severe IPF and his CT scan is impressive. Would suggest pallaitve care to discuss goals of care. Recommendations Continue vanco and cefepime. Await cultures from Pleural fluid and follow wbc to see if improves follow thoracentesis.  Check sputum cx if he can produce. He was coughing a good deal when I saw him. Can adjust abx based on results and procalcitonin.  Thank you very much  for allowing me to participate in the care of this patient. Please call with questions.   Cheral Marker. Ola Spurr, MD

## 2016-07-03 NOTE — Progress Notes (Signed)
Glenn Lame, MD [facility]   [address], [place], [place] [address] Phone: [phone] Fax : [phone]   Subjective: The patient was in the ICU with a GI bleed and a duodenal ulcer seen. The patient had duodenal ulcer clipped and injected with epinephrine. The patient's hemoglobin has been stable and is been no reports of any continued GI bleeding. The patient denies any abdominal complaints at the present time.   Objective: Vital signs in last 24 hours: Vitals:   07/02/16 0833 07/02/16 1139 07/02/16 2024 07/03/16 0506  BP: (!) 83/42 (!) 108/45 (!) 118/49 105/61  Pulse: 65 68 72 (!) 45  Resp:  19 18 18   Temp: 97.6 F (36.4 C) 97.6 F (36.4 C) 98.4 F (36.9 C) 97.4 F (36.3 C)  TempSrc: Oral  Oral Oral  SpO2: 97% 99% 97% 94%  Weight:    229 lb 9.6 oz (104.1 kg)  Height:       Weight change: -6.4 oz (-0.181 kg)  Intake/Output Summary (Last 24 hours) at 07/03/16 1052 Last data filed at 07/03/16 0805  Gross per 24 hour  Intake                0 ml  Output              975 ml  Net             -975 ml     Exam: Heart:: Regular rate and rhythm, S1S2 present or without murmur or extra heart sounds Lungs: expiratory wheezes and rhonchi throughout both lung fields Abdomen: soft, nontender, normal bowel sounds   Lab Results: @LABTEST2 @ Micro Results: Recent Results (from the past 240 hour(s))  C difficile quick scan w PCR reflex     Status: None   Collection Time: 06/26/16  7:23 PM  Result Value Ref Range Status   C Diff antigen NEGATIVE NEGATIVE Final   C Diff toxin NEGATIVE NEGATIVE Final   C Diff interpretation No C. difficile detected.  Final  Blood Culture (routine x 2)     Status: None   Collection Time: 06/26/16  7:50 PM  Result Value Ref Range Status   Specimen Description BLOOD L AC  Final   Special Requests BOTTLES DRAWN AEROBIC AND ANAEROBIC BCAV  Final   Culture NO GROWTH 5 DAYS  Final   Report Status 07/01/2016 FINAL  Final  Blood Culture  (routine x 2)     Status: None   Collection Time: 06/26/16  7:50 PM  Result Value Ref Range Status   Specimen Description BLOOD  R AC  Final   Special Requests BOTTLES DRAWN AEROBIC AND ANAEROBIC BCAV  Final   Culture NO GROWTH 5 DAYS  Final   Report Status 07/01/2016 FINAL  Final  Urine culture     Status: None   Collection Time: 06/27/16  9:03 AM  Result Value Ref Range Status   Specimen Description URINE, RANDOM  Final   Special Requests NONE  Final   Culture   Final    NO GROWTH Performed at [facility], [place] [address], [place], [place] [address]    Report Status 06/28/2016 FINAL  Final  MRSA PCR Screening     Status: Abnormal   Collection Time: 06/27/16  8:36 PM  Result Value Ref Range Status   MRSA by PCR POSITIVE (A) NEGATIVE Final    Comment:        The GeneXpert MRSA Assay (FDA approved for NASAL specimens only),  is one component of a comprehensive MRSA colonization surveillance program. It is not intended to diagnose MRSA infection nor to guide or monitor treatment for MRSA infections. RESULT CALLED TO, READ BACK BY AND VERIFIED WITH: TAKERA MESNICT 06/27/16 @ 2148  [place]    Studies/Results: No results found. Medications: I have reviewed the patient's current medications. Scheduled Meds: . carvedilol  3.125 mg Oral BID  . ceFEPIme (MAXIPIME) 2 GM IVP  2 g Intravenous Q12H  . Chlorhexidine Gluconate Cloth  6 each Topical Q0600  . diltiazem  60 mg Oral Q12H  . feeding supplement (ENSURE ENLIVE)  237 mL Oral BID BM  . finasteride  5 mg Oral Daily  . insulin aspart  0-15 Units Subcutaneous TID WC  . insulin aspart  0-5 Units Subcutaneous QHS  . levothyroxine  75 mcg Oral QAC breakfast  . mupirocin ointment  1 application Nasal BID  . rosuvastatin  20 mg Oral Daily  . sodium chloride flush  10-40 mL Intracatheter Q12H  . sodium chloride flush  3 mL Intravenous Q12H  . vancomycin  1,000 mg Intravenous Q24H   Continuous Infusions: PRN Meds:.acetaminophen  **OR** acetaminophen, albuterol, docusate, guaiFENesin-codeine, HYDROcodone-acetaminophen, ipratropium-albuterol, [DISCONTINUED] ondansetron **OR** ondansetron (ZOFRAN) IV, polyethylene glycol, sodium chloride flush   Assessment: Active Problems:   Sepsis ([place])   Blood in stool   Chronic duodenal ulcer with hemorrhage   Gastritis without bleeding    Plan: This patient is status post GI bleed without any further sign of any bleeding. The patient had a duodenal ulcer that was injected with epinephrine and then 3 hemoclips were placed on the ulcer vessel. The patient has no further sign of bleeding and his hemoglobin has been stable. I will sign off.  Please call if any further GI concerns or questions.  We would like to thank you for the opportunity to participate in the care of Glenn Reeves.    LOS: 7 days   Glenn Reeves 07/03/2016, 10:52 AM

## 2016-07-04 ENCOUNTER — Inpatient Hospital Stay: Payer: Medicare Other

## 2016-07-04 DIAGNOSIS — R5383 Other fatigue: Secondary | ICD-10-CM

## 2016-07-04 DIAGNOSIS — Z7189 Other specified counseling: Secondary | ICD-10-CM

## 2016-07-04 DIAGNOSIS — J189 Pneumonia, unspecified organism: Secondary | ICD-10-CM

## 2016-07-04 DIAGNOSIS — Z515 Encounter for palliative care: Secondary | ICD-10-CM

## 2016-07-04 DIAGNOSIS — R531 Weakness: Secondary | ICD-10-CM

## 2016-07-04 LAB — BASIC METABOLIC PANEL
Anion gap: 5 (ref 5–15)
BUN: 51 mg/dL — ABNORMAL HIGH (ref 6–20)
CALCIUM: 7.7 mg/dL — AB (ref 8.9–10.3)
CO2: 21 mmol/L — ABNORMAL LOW (ref 22–32)
CREATININE: 1.91 mg/dL — AB (ref 0.61–1.24)
Chloride: 112 mmol/L — ABNORMAL HIGH (ref 101–111)
GFR calc non Af Amer: 31 mL/min — ABNORMAL LOW (ref >60)
GFR, EST AFRICAN AMERICAN: 36 mL/min — AB (ref >60)
Glucose, Bld: 182 mg/dL — ABNORMAL HIGH (ref 65–99)
Potassium: 4.6 mmol/L (ref 3.5–5.1)
SODIUM: 138 mmol/L (ref 135–145)

## 2016-07-04 LAB — CBC
HCT: 24.3 % — ABNORMAL LOW (ref 40.0–52.0)
HEMOGLOBIN: 8.1 g/dL — AB (ref 13.0–18.0)
MCH: 29.6 pg (ref 26.0–34.0)
MCHC: 33.2 g/dL (ref 32.0–36.0)
MCV: 89.1 fL (ref 80.0–100.0)
Platelets: 277 10*3/uL (ref 150–440)
RBC: 2.73 MIL/uL — ABNORMAL LOW (ref 4.40–5.90)
RDW: 15.8 % — ABNORMAL HIGH (ref 11.5–14.5)
WBC: 21 10*3/uL — AB (ref 3.8–10.6)

## 2016-07-04 LAB — GLUCOSE, CAPILLARY
GLUCOSE-CAPILLARY: 159 mg/dL — AB (ref 65–99)
Glucose-Capillary: 224 mg/dL — ABNORMAL HIGH (ref 65–99)
Glucose-Capillary: 170 mg/dL — ABNORMAL HIGH (ref 65–99)
Glucose-Capillary: 192 mg/dL — ABNORMAL HIGH (ref 65–99)

## 2016-07-04 LAB — PH, BODY FLUID: PH, BODY FLUID: 7.6

## 2016-07-04 MED ORDER — VANCOMYCIN HCL IN DEXTROSE 1-5 GM/200ML-% IV SOLN
1000.0000 mg | INTRAVENOUS | Status: DC
  Administered 2016-07-04: 1000 mg via INTRAVENOUS
  Filled 2016-07-04 (×2): qty 200

## 2016-07-04 MED ORDER — DEXTROSE 5 % IV SOLN
2.0000 g | Freq: Two times a day (BID) | INTRAVENOUS | Status: DC
  Administered 2016-07-04 – 2016-07-05 (×2): 2 g via INTRAVENOUS
  Filled 2016-07-04 (×3): qty 2

## 2016-07-04 MED ORDER — PANTOPRAZOLE SODIUM 40 MG PO TBEC: 40.0000 mg | DELAYED_RELEASE_TABLET | Freq: Two times a day (BID) | ORAL | Status: DC

## 2016-07-04 NOTE — Progress Notes (Signed)
Pt daughter refused swallow evaluation yesterday per speech

## 2016-07-04 NOTE — Evaluation (Signed)
Clinical/Bedside Swallow Evaluation Patient Details  Name: Glenn Reeves MRN: [id number] Date of Birth: August 18, 1930  Today's Date: 07/04/2016 Time: SLP Start Time (ACUTE ONLY): 0930 SLP Stop Time (ACUTE ONLY): 1030 SLP Time Calculation (min) (ACUTE ONLY): 60 min  Past Medical History:  Past Medical History:  Diagnosis Date  . Atrial fibrillation ([place])   . CAD (coronary artery disease)   . Diabetes mellitus without complication ([place])   . GI bleed   . Hypertension   . Thyroid disease    Past Surgical History:  Past Surgical History:  Procedure Laterality Date  . ESOPHAGOGASTRODUODENOSCOPY (EGD) WITH PROPOFOL N/A 06/29/2016   Procedure: ESOPHAGOGASTRODUODENOSCOPY (EGD) WITH PROPOFOL;  Surgeon: [name], MD;  Location: [facility] ENDOSCOPY;  Service: Endoscopy;  Laterality: N/A;   HPI:  Pt is a 83 y.o. male with a known history of DM, CAD, atrial fibrillation, hypertension, CKD stage III who was recently treated in [place], [place] for pneumonia presents to the emergency room due to weakness, fall and diarrhea. Patient has been found to have bibasilar pneumonia, leukocytosis, tachycardia with elevated lactic acid of 3.9. He also was treated recently for UTI. He has chronic shortness of breath which is similar. He does have sputum which is clear. Feels lightheaded and dizzy. Poor appetite. Has chronic back pain which is unchanged. Per MD report, pt has had pneumonia recently and would like for BSE to be completed to assess for dysphagia/aspiration. While admitted, pt had a rapid response called then was transferred to CCU w/ GI bleed. Pt was briefly intubated orally for upper endoscopy procedure but was then successfully extubated. Pt was then placed on an oral diet by MD. Daughter and pt denied any difficulty swallowing during initial screening, but Daughter did endorse s/s of Reflux including belching during and post meals at home. Per Chest CT on 06/29/16, results indicated Severe  changes of interstitial lung disease as described above,most c/w idiopathic pulmonary fibrosis..   Assessment / Plan / Recommendation Clinical Impression  Pt appears to present w/ no significant oropharyngeal phase dysphagia; potential Esophageal phase dysphagia w/ dysmotility including belching followed by coughing x1 episode. Any Esophageal phase dysphagia w/ dysmotility(belching, regurgitation) can increase risk for aspiration of Reflux material thus placing pt at increased for Pulmonary issues/decline. Pt consumed po trials during breakfast meal including thin liquids via cup and straw then purees and soft solids. No immediate, overt s/s of aspiration noted; adequate oral phase management and clearing of boluses occurred. Pt did exhibit harsh coughing post belching episode post trials of water then solids. NSG reported good toleration when swallowing pills w/ water; sips of other thin liquids via straw. This presentation appears similar to that described by Daughter yesterday when she stated pt does have belching and coughing post meals. Recommend a dysphagia level 3(cut meats) and thin liquids; general aspiration precautions; REFLUX precautions(handouts given to pt/family); pills w/ Puree if needed for easier, safer swallowing. Recommend tray setup and monitoring during meals as needed d/t overall weakness. NSG to reconsult ST services if any decline in status while admitted. MD consulted w/ above information and recommendation for further GI assessment/tx of Reflux.  SLP Visit Diagnosis: Dysphagia, pharyngoesophageal phase (R13.14) (Esophageal )    Aspiration Risk   (reduced following general precautions)    Diet Recommendation  Dysphagia level 3(mech soft - cut meats, moistened); thin liquids. General aspiration precautions; REFLUX precautions. Tray setup at meals; monitoring as needed for support.   Medication Administration: Whole meds with liquid (Whole in Puree for  easier swallowing as needed)     Other  Recommendations Recommended Consults: Consider GI evaluation Oral Care Recommendations: Oral care BID;Staff/trained caregiver to provide oral care   Follow up Recommendations None      Frequency and Duration            Prognosis Prognosis for Safe Diet Advancement: Good Barriers to Reach Goals:  (min confusion ) Barriers/Prognosis Comment: unsure of pt's baseline Cognitive presentation      Swallow Study   General Date of Onset: 06/26/16 HPI: Pt is a 83 y.o. male with a known history of DM, CAD, atrial fibrillation, hypertension, CKD stage III who was recently treated in [place], [place] for pneumonia presents to the emergency room due to weakness, fall and diarrhea. Patient has been found to have bibasilar pneumonia, leukocytosis, tachycardia with elevated lactic acid of 3.9. He also was treated recently for UTI. He has chronic shortness of breath which is similar. He does have sputum which is clear. Feels lightheaded and dizzy. Poor appetite. Has chronic back pain which is unchanged. Per MD report, pt has had pneumonia recently and would like for BSE to be completed to assess for dysphagia/aspiration. While admitted, pt had a rapid response called then was transferred to CCU w/ GI bleed. Pt was briefly intubated orally for upper endoscopy procedure but was then successfully extubated. Pt was then placed on an oral diet by MD. Daughter and pt denied any difficulty swallowing during initial screening, but Daughter did endorse s/s of Reflux including belching during and post meals at home. Per Chest CT on 06/29/16, results indicated Severe changes of interstitial lung disease as described above,most c/w idiopathic pulmonary fibrosis.. Type of Study: Bedside Swallow Evaluation Previous Swallow Assessment: none reported Diet Prior to this Study: Regular;Thin liquids Temperature Spikes Noted: No (wbc elevated) Respiratory Status: Room air History of Recent Intubation:  Yes Length of Intubations (days): 1 days Date extubated: 06/29/16 Behavior/Cognition: Alert;Cooperative;Pleasant mood;Distractible;Requires cueing Oral Cavity Assessment: Within Functional Limits;Dry Oral Care Completed by SLP: Recent completion by staff Oral Cavity - Dentition: Adequate natural dentition;Missing dentition (few) Vision: Functional for self-feeding Self-Feeding Abilities: Able to feed self;Needs assist;Needs set up Patient Positioning: Upright in bed Baseline Vocal Quality: Normal Volitional Cough: Strong Volitional Swallow: Able to elicit    Oral/Motor/Sensory Function Overall Oral Motor/Sensory Function: Within functional limits   Ice Chips Ice chips: Not tested Other Comments: pt already drinking thin liquids during meal    Thin Liquid Thin Liquid: Within functional limits Presentation: Cup;Self Fed;Straw (~4+ ozs total)    Nectar Thick Nectar Thick Liquid: Not tested   Honey Thick Honey Thick Liquid: Not tested   Puree Puree: Within functional limits Presentation: Self Fed;Spoon (2 trials)   Solid   GO   Solid: Within functional limits (softened, moistened foods) Presentation: Self Fed;Spoon (4 trials) Other Comments: belching noted followed by coughing (x1 episode)         Orinda Kenner, MS, CCC-SLP [facility] Alberg 07/04/2016,3:49 PM

## 2016-07-04 NOTE — Progress Notes (Signed)
[facility] INFECTIOUS DISEASE PROGRESS NOTE Date of Admission:  06/26/2016     ID: Glenn Reeves is a 83 y.o. male with persistent leukocytosis Active Problems:   Sepsis ([place])   Blood in stool   Chronic duodenal ulcer with hemorrhage   Gastritis without bleeding   Subjective: No fevers,  No pain, still some cough.  Daughter at bedside  ROS  Eleven systems are reviewed and negative except per hpi  Medications:  Antibiotics Given (last 72 hours)    Date/Time Action Medication Dose Rate   07/01/16 2149 Given   vancomycin (VANCOCIN) IVPB 1000 mg/200 mL premix 1,000 mg 200 mL/hr   07/01/16 2149 Given   ceFEPIme (MAXIPIME) 2 g in dextrose 5 % 50 mL IVPB 2 g 100 mL/hr   07/02/16 1400 Given   ceFEPIme (MAXIPIME) 2 g in dextrose 5 % 50 mL IVPB 2 g 100 mL/hr   07/02/16 2155 Given   ceFEPIme (MAXIPIME) 2 g in dextrose 5 % 50 mL IVPB 2 g 100 mL/hr   07/02/16 2253 Given   vancomycin (VANCOCIN) IVPB 1000 mg/200 mL premix 1,000 mg 200 mL/hr   07/03/16 1317 Given   ceFEPIme (MAXIPIME) 2 g in dextrose 5 % 50 mL IVPB 2 g 100 mL/hr   07/03/16 2222 Given   ceFEPIme (MAXIPIME) 2 g in dextrose 5 % 50 mL IVPB 2 g 100 mL/hr   07/03/16 2300 Given   vancomycin (VANCOCIN) IVPB 1000 mg/200 mL premix 1,000 mg 200 mL/hr   07/04/16 0844 Given   ceFEPIme (MAXIPIME) 2 g in dextrose 5 % 50 mL IVPB 2 g 100 mL/hr     . carvedilol  3.125 mg Oral BID  . ceFEPIme (MAXIPIME) 2 GM IVP  2 g Intravenous Q12H  . diltiazem  60 mg Oral Q12H  . feeding supplement (ENSURE ENLIVE)  237 mL Oral BID BM  . finasteride  5 mg Oral Daily  . insulin aspart  0-15 Units Subcutaneous TID WC  . insulin aspart  0-5 Units Subcutaneous QHS  . levothyroxine  75 mcg Oral QAC breakfast  . pantoprazole  40 mg Oral BID  . rosuvastatin  20 mg Oral Daily  . sodium chloride flush  10-40 mL Intracatheter Q12H  . sodium chloride flush  3 mL Intravenous Q12H  . vancomycin  1,000 mg Intravenous Q24H    Objective: Vital  signs in last 24 hours: Temp:  [97.4 F (36.3 C)-98.4 F (36.9 C)] 97.4 F (36.3 C) (03/13 1212) Pulse Rate:  [51-73] 73 (03/13 1212) Resp:  [18-19] 19 (03/13 1212) BP: (98-120)/(48-73) 98/73 (03/13 1212) SpO2:  [94 %-100 %] 97 % (03/13 1212) Weight:  [103.8 kg (228 lb 14.4 oz)] 103.8 kg (228 lb 14.4 oz) (03/13 0455) Constitutional: frail, weak, unable to sit up without assistance. Coughing  HENT: anicteric Mouth/Throat: Oropharynx is clear and dry . No oropharyngeal exudate.  Cardiovascular: Normal rate, regular rhythm and normal heart sounds.  Pulmonary/Chest: bilataral crackles Abdominal: Soft. Bowel sounds are normal. He exhibits no distension. There is no tenderness.  Foley in place Lymphadenopathy: He has no cervical adenopathy.  Neurological: awakea and interactive  Skin: some brusing, no wound Psychiatric: He has a normal mood and affect. His behavior is normal.   Lab Results  Recent Labs  07/02/16 0534 07/04/16 0445  WBC 20.5* 21.0*  HGB 7.9* 8.1*  HCT 23.8* 24.3*  NA 138 138  K 4.5 4.6  CL 114* 112*  CO2 20* 21*  BUN 63* 51*  CREATININE 2.07* 1.91*    Microbiology: Results for orders placed or performed during the hospital encounter of 06/26/16  C difficile quick scan w PCR reflex     Status: None   Collection Time: 06/26/16  7:23 PM  Result Value Ref Range Status   C Diff antigen NEGATIVE NEGATIVE Final   C Diff toxin NEGATIVE NEGATIVE Final   C Diff interpretation No C. difficile detected.  Final  Blood Culture (routine x 2)     Status: None   Collection Time: 06/26/16  7:50 PM  Result Value Ref Range Status   Specimen Description BLOOD L AC  Final   Special Requests BOTTLES DRAWN AEROBIC AND ANAEROBIC BCAV  Final   Culture NO GROWTH 5 DAYS  Final   Report Status 07/01/2016 FINAL  Final  Blood Culture (routine x 2)     Status: None   Collection Time: 06/26/16  7:50 PM  Result Value Ref Range Status   Specimen Description BLOOD  R AC  Final    Special Requests BOTTLES DRAWN AEROBIC AND ANAEROBIC BCAV  Final   Culture NO GROWTH 5 DAYS  Final   Report Status 07/01/2016 FINAL  Final  Urine culture     Status: None   Collection Time: 06/27/16  9:03 AM  Result Value Ref Range Status   Specimen Description URINE, RANDOM  Final   Special Requests NONE  Final   Culture   Final    NO GROWTH Performed at [facility], [place] [address] [place], [place] [address]    Report Status 06/28/2016 FINAL  Final  MRSA PCR Screening     Status: Abnormal   Collection Time: 06/27/16  8:36 PM  Result Value Ref Range Status   MRSA by PCR POSITIVE (A) NEGATIVE Final    Comment:        The GeneXpert MRSA Assay (FDA approved for NASAL specimens only), is one component of a comprehensive MRSA colonization surveillance program. It is not intended to diagnose MRSA infection nor to guide or monitor treatment for MRSA infections. RESULT CALLED TO, READ BACK BY AND VERIFIED WITH: TAKERA MESNICT 06/27/16 @ 2148  [place]   Body fluid culture     Status: None (Preliminary result)   Collection Time: 07/03/16  2:25 PM  Result Value Ref Range Status   Specimen Description PLEURAL  Final   Special Requests NONE  Final   Gram Stain   Final    ABUNDANT WBC PRESENT,BOTH PMN AND MONONUCLEAR NO ORGANISMS SEEN    Culture   Final    NO GROWTH < 24 HOURS Performed at [facility], [place] [address], [place], [place] [address]    Report Status PENDING  Incomplete    Studies/Results: Dg Pelvis 1-2 Views  Result Date: 07/04/2016 CLINICAL DATA:  Pain. EXAM: PELVIS - 1-2 VIEW COMPARISON:  None. FINDINGS: There is no evidence of pelvic fracture or diastasis. No pelvic bone lesions are seen. IMPRESSION: Negative. Electronically Signed   By: Misty Stanley M.D.   On: 07/04/2016 12:19   Dg Chest Port 1 View  Result Date: 07/03/2016 CLINICAL DATA:  Status post thoracentesis EXAM: PORTABLE CHEST 1 VIEW COMPARISON:  07/01/2016 FINDINGS: There is no evidence of  pneumothorax post left thoracentesis. Lungs remain under aerated. Fibrotic pulmonary opacities are seen bilaterally. Upper normal heart size. Left jugular venous catheter is stable. IMPRESSION: No pneumothorax post thoracentesis. Electronically Signed   By: Marybelle Killings M.D.   On: 07/03/2016 14:54  US Thoracentesis Asp Pleural Space W/img Guide  Result Date: 07/03/2016 INDICATION: No pleural effusion EXAM: ULTRASOUND GUIDED LEFT THORACENTESIS MEDICATIONS: None. COMPLICATIONS: None immediate. PROCEDURE: An ultrasound guided thoracentesis was thoroughly discussed with the patient and questions answered. The benefits, risks, alternatives and complications were also discussed. The patient understands and wishes to proceed with the procedure. Written consent was obtained. Ultrasound was performed to localize and mark an adequate pocket of fluid in the left chest. The area was then prepped and draped in the normal sterile fashion. 1% Lidocaine was used for local anesthesia. Under ultrasound guidance a Safe-T-Centesis catheter was introduced. Thoracentesis was performed. The catheter was removed and a dressing applied. FINDINGS: A total of approximately 750 cc of serosanguineous fluid was removed. Samples were sent to the laboratory as requested by the clinical team. IMPRESSION: Successful ultrasound guided left thoracentesis yielding 750 cc of pleural fluid. Electronically Signed   By: Marybelle Killings M.D.   On: 07/03/2016 14:56    Assessment/Plan: Glenn Reeves is a 83 y.o. male with recently dxed severe  IPF, recent admission for PNA in [facility] hospital readmitted with weakness and elevated wbc. Course complicated by UGIB and persistent leukocytosis as well as pleural effusion tapped today. He has no fevers. Neg BCX, UCX. No sputum cx done.  HD stable Has been on cefepime and vanco.  Likely multifactorial causes of his elevate wbc. So far no evidence occult abscess but has not had abd imaging.  He has severe  IPF and his CT scan is impressive. I do suspect his lungs as his source of infection and elevated wbc. He is at risk of broncitiis and PNA from resistant organisms given underlying lung disease. Procalcitonin down to 0.17 Recommendations Continue vanco and cefepime. Await cultures from Pleural fluid and follow wbc to see if improves follow thoracentesis.  Check sputum cx if he can produce. At this time if he remains stable as is improving for dc I would send on oral levofloxacin for a total 14 days of abx - currently Day 9 Would dc foley and get pt oob to chair Thank you very much for the consult. Will follow with you.  Glenn Reeves P   07/04/2016, 3:53 PM

## 2016-07-04 NOTE — Progress Notes (Signed)
Physical Therapy Treatment Patient Details Name: Glenn Reeves MRN: [id number] DOB: 21-Nov-1930 Today's Date: 07/04/2016    History of Present Illness Pt is an 83 yo male admitted to [facility] due to weakness, progressive SOB, fall and diarrhea, diagnosed with sepsis with bibasilar pneumonia. PMH includes; CAD, A-fib, HTN, CKD III, Pneumonia, UTI, DM, chronic back pain and GI bleed. Pt now with complicated hospital stay secondary to spontaneous GI bleed, rapid response event called and intubation on 3/8 secondary to EGD. Pt extubated later on 3/8, however has difficulty keepings sats WNL while on room air. Pt re-evaluated this date.    PT Comments    Pt awake and willing to participate in PT treatment. Pt continues to display decreased strength to perform functional tasks and requires max assist for bed mobility and transfers. He also displays signs of dyspnea upon exertion w/ increased work of breathing, O2 monitored via pulse oximeter and sat stayed above 97%. Attempted transfers and patient requires max assist and becomes unsteady in standing requiring the use of RW and hands on assist to maintain balance. Pt was only able to tolerate standing for short duration before needing to sit down again. Overall patient is severely limited in functional mobility due to weakness and poor activity tolerance, he will continue to benefit from skilled PT; recommend STR following hospital stay.      Follow Up Recommendations  SNF     Equipment Recommendations  None recommended by PT    Recommendations for Other Services OT consult     Precautions / Restrictions Precautions Precautions: Fall Restrictions Weight Bearing Restrictions: No    Mobility  Bed Mobility Overal bed mobility: Needs Assistance Bed Mobility: Supine to Sit     Supine to sit: Max assist     General bed mobility comments: pt required max assist to advance LE, to bring trunk into upright position and to scoot to EOB,    Transfers Overall transfer level: Needs assistance Equipment used: Rolling walker (2 wheeled) Transfers: Sit to/from Stand Sit to Stand: Max assist         General transfer comment: decreased strenght throughout c/o shortness of breat O2 sats were 98% on Room air, able to transfer once to standing requiring max assist throughout and cuing for proper hand placement, leans posteriorly during transfer   Ambulation/Gait                 Stairs            Wheelchair Mobility    Modified Rankin (Stroke Patients Only)       Balance Overall balance assessment: Needs assistance Sitting-balance support: Bilateral upper extremity supported;Feet supported Sitting balance-Leahy Scale: Fair Sitting balance - Comments: required bilat UE support to maintain sitting w/o back support, SBA required for safety, decreased activity tolerance in sitting requested to lay back down after 5 mins Postural control: Posterior lean Standing balance support: Bilateral upper extremity supported Standing balance-Leahy Scale: Poor Standing balance comment: requries use of RW for stability and hands on assist to prevent LOB, low standing tolerance, displayed dyspnea                     Cognition Arousal/Alertness: Awake/alert Behavior During Therapy: WFL for tasks assessed/performed Overall Cognitive Status: Within Functional Limits for tasks assessed                      Exercises      General Comments  Pertinent Vitals/Pain Pain Assessment: No/denies pain    Home Living                      Prior Function            PT Goals (current goals can now be found in the care plan section) Acute Rehab PT Goals Patient Stated Goal: To get better and return home PT Goal Formulation: With patient Time For Goal Achievement: 07/14/16 Potential to Achieve Goals: Fair Progress towards PT goals: Not progressing toward goals - comment (decreased activity  tolerance today)    Frequency    Min 2X/week      PT Plan Current plan remains appropriate    Co-evaluation             End of Session Equipment Utilized During Treatment: Gait belt Activity Tolerance: Patient limited by fatigue Patient left: in bed;with call bell/phone within reach;with bed alarm set;with SCD's reapplied Nurse Communication: Mobility status PT Visit Diagnosis: Unsteadiness on feet (R26.81);Muscle weakness (generalized) (M62.81);History of falling (Z91.81);Difficulty in walking, not elsewhere classified (R26.2)     Time: 3094-0768 PT Time Calculation (min) (ACUTE ONLY): 27 min  Charges:                       G Codes:       Charnele Semple July 14, 2016, 11:34 AM

## 2016-07-04 NOTE — NC FL2 (Signed)
[place] LEVEL OF CARE SCREENING TOOL     IDENTIFICATION  Patient Name: Glenn Reeves Birthdate: May 29, 1930 Sex: male Admission Date (Current Location): 06/26/2016  [place] and [place] Number:  Engineering geologist and Address:  [facility], [address] [place], [place] [address]      Provider Number: [id number]  Attending Physician Name and Address:  Dustin Flock, MD  Relative Name and Phone Number:  [name] Relative [phone]  [phone] or [name] Daughter   [phone]     Current Level of Care: Hospital Recommended Level of Care: [place] Prior Approval Number:    Date Approved/Denied:   PASRR Number: Pending  Discharge Plan: SNF    Current Diagnoses: Patient Active Problem List   Diagnosis Date Noted  . Blood in stool   . Chronic duodenal ulcer with hemorrhage   . Gastritis without bleeding   . Sepsis ([place]) 06/26/2016    Orientation RESPIRATION BLADDER Height & Weight     Self, Place  Normal Indwelling catheter Weight: 228 lb 14.4 oz (103.8 kg) Height:  5\' 9"  (175.3 cm)  BEHAVIORAL SYMPTOMS/MOOD NEUROLOGICAL BOWEL NUTRITION STATUS      Continent Diet  AMBULATORY STATUS COMMUNICATION OF NEEDS Skin   Limited Assist Verbally Normal                       Personal Care Assistance Level of Assistance  Bathing, Feeding, Dressing Bathing Assistance: Limited assistance Feeding assistance: Independent Dressing Assistance: Limited assistance     Functional Limitations Info  Sight, Hearing, Speech Sight Info: Adequate Hearing Info: Impaired Speech Info: Adequate    SPECIAL CARE FACTORS FREQUENCY  OT (By licensed OT), PT (By licensed PT)     PT Frequency: Min 5x a week OT Frequency: Min 5x a week            Contractures Contractures Info: Not present    Additional Factors Info  Allergies, Insulin Sliding Scale, Isolation Precautions   Allergies  Info: NKA   Insulin Sliding Scale Info: insulin aspart (novoLOG) injection 0-15 Units 3x a day with meals Isolation Precautions Info: Contact Precautions for MRSA     Current Medications (07/04/2016):  This is the current hospital active medication list Current Facility-Administered Medications  Medication Dose Route Frequency Provider Last Rate Last Dose  . acetaminophen (TYLENOL) tablet 650 mg  650 mg Oral Q6H PRN Hillary Bow, MD       Or  . acetaminophen (TYLENOL) suppository 650 mg  650 mg Rectal Q6H PRN Srikar Sudini, MD      . albuterol (PROVENTIL) (2.5 MG/3ML) 0.083% nebulizer solution 2.5 mg  2.5 mg Nebulization Q4H PRN Wilhelmina Mcardle, MD      . carvedilol (COREG) tablet 3.125 mg  3.125 mg Oral BID Vaughan Basta, MD   3.125 mg at 07/04/16 0843  . ceFEPIme (MAXIPIME) 2 g in dextrose 5 % 50 mL IVPB  2 g Intravenous Q12H Napoleon Form, RPH   2 g at 07/04/16 0844  . diltiazem (CARDIZEM SR) 12 hr capsule 60 mg  60 mg Oral Q12H Wilhelmina Mcardle, MD   60 mg at 07/04/16 0844  . docusate (COLACE) 50 MG/5ML liquid 100 mg  100 mg Per Tube BID PRN Awilda Bill, NP      . feeding supplement (ENSURE ENLIVE) (ENSURE ENLIVE) liquid 237 mL  237 mL Oral BID BM Wilhelmina Mcardle, MD   237 mL at  07/03/16 1506  . finasteride (PROSCAR) tablet 5 mg  5 mg Oral Daily Hillary Bow, MD   5 mg at 07/04/16 0843  . guaiFENesin-codeine 100-10 MG/5ML solution 10 mL  10 mL Oral Q4H PRN Mikael Spray, NP   10 mL at 07/02/16 2359  . HYDROcodone-acetaminophen (NORCO/VICODIN) 5-325 MG per tablet 1-2 tablet  1-2 tablet Oral Q4H PRN Hillary Bow, MD   1 tablet at 07/04/16 0858  . insulin aspart (novoLOG) injection 0-15 Units  0-15 Units Subcutaneous TID WC Wilhelmina Mcardle, MD   3 Units at 07/04/16 [phone]  . insulin aspart (novoLOG) injection 0-5 Units  0-5 Units Subcutaneous QHS Wilhelmina Mcardle, MD      . ipratropium-albuterol (DUONEB) 0.5-2.5 (3) MG/3ML nebulizer solution 3 mL  3 mL Nebulization Q4H PRN  Wilhelmina Mcardle, MD      . levothyroxine (SYNTHROID, LEVOTHROID) tablet 75 mcg  75 mcg Oral QAC breakfast Hillary Bow, MD   75 mcg at 07/04/16 0843  . ondansetron (ZOFRAN) injection 4 mg  4 mg Intravenous Q6H PRN Srikar Sudini, MD      . pantoprazole (PROTONIX) EC tablet 40 mg  40 mg Oral BID Dustin Flock, MD   40 mg at 07/04/16 0843  . polyethylene glycol (MIRALAX / GLYCOLAX) packet 17 g  17 g Oral Daily PRN Srikar Sudini, MD      . rosuvastatin (CRESTOR) tablet 20 mg  20 mg Oral Daily Hillary Bow, MD   20 mg at 07/04/16 0843  . sodium chloride flush (NS) 0.9 % injection 10-40 mL  10-40 mL Intracatheter Q12H Awilda Bill, NP   10 mL at 07/03/16 2223  . sodium chloride flush (NS) 0.9 % injection 10-40 mL  10-40 mL Intracatheter PRN Awilda Bill, NP      . sodium chloride flush (NS) 0.9 % injection 3 mL  3 mL Intravenous Q12H Hillary Bow, MD   3 mL at 07/04/16 0849  . vancomycin (VANCOCIN) IVPB 1000 mg/200 mL premix  1,000 mg Intravenous Q24H Napoleon Form, RPH   1,000 mg at 07/03/16 2300     Discharge Medications: Please see discharge summary for a list of discharge medications.  Relevant Imaging Results:  Relevant Lab Results:   Additional Information SSN [id number]  Ross Ludwig, [place]

## 2016-07-04 NOTE — Progress Notes (Signed)
[facility] [place] NAME: Jabari Swoveland    MR#:  [id number]  DATE OF BIRTH:  September 28, 1930  SUBJECTIVE:  CHIEF COMPLAINT:   Chief Complaint  Patient presents with  . Weakness  . Diarrhea   Patient complains of pain in his buttocks region.  REVIEW OF SYSTEMS:  CONSTITUTIONAL: No fever,Positive for fatigue or weakness.  EYES: No blurred or double vision.  EARS, NOSE, AND THROAT: No tinnitus or ear pain.  RESPIRATORY: No cough, shortness of breath, wheezing or hemoptysis.  CARDIOVASCULAR: No chest pain, orthopnea, edema.  GASTROINTESTINAL: No nausea, vomiting, diarrhea or abdominal pain.  GENITOURINARY: No dysuria, hematuria.  ENDOCRINE: No polyuria, nocturia,  HEMATOLOGY: No anemia, easy bruising or bleeding SKIN: No rash or lesion. MUSCULOSKELETAL: No joint pain or arthritis.  Positive pain in the buttocks region NEUROLOGIC: No tingling, numbness, weakness.  PSYCHIATRY: No anxiety or depression.   ROS  DRUG ALLERGIES:  No Known Allergies  VITALS:  Blood pressure 98/73, pulse 73, temperature 97.4 F (36.3 C), temperature source Oral, resp. rate 19, height 5\' 9"  (1.753 m), weight 228 lb 14.4 oz (103.8 kg), SpO2 97 %.  PHYSICAL EXAMINATION:  GENERAL:  83 y.o.-year-old patient lying in the bed with no acute distress.  EYES: Pupils equal, round, reactive to light and accommodation. No scleral icterus. Extraocular muscles intact. Conjunctiva pale. HEENT: Head atraumatic, normocephalic. Oropharynx and nasopharynx clear.  NECK:  Supple, no jugular venous distention. No thyroid enlargement, no tenderness.  LUNGS: Normal breath sounds bilaterally, no wheezing, Bilateral crepitation. No use of accessory muscles of respiration.  CARDIOVASCULAR: S1, S2 normal. No murmurs, rubs, or gallops.  ABDOMEN: Soft, nontender, nondistended. Bowel sounds present. No organomegaly or mass.  EXTREMITIES: No pedal edema, cyanosis, or clubbing. Tenderness  in the buttocks region NEUROLOGIC: Cranial nerves II through XII are intact. Muscle strength 4/5 in all extremities. Sensation intact. Gait not checked.  PSYCHIATRIC: The patient is alert and oriented x 2.  SKIN: No obvious rash, lesion, or ulcer.   Physical Exam LABORATORY PANEL:   CBC  Recent Labs Lab 07/04/16 0445  WBC 21.0*  HGB 8.1*  HCT 24.3*  PLT 277   ------------------------------------------------------------------------------------------------------------------  Chemistries   Recent Labs Lab 06/30/16 0419 06/30/16 2221  07/04/16 0445  NA 137 137  < > 138  K 4.5 4.6  < > 4.6  CL 112* 114*  < > 112*  CO2 17* 18*  < > 21*  GLUCOSE 119* 191*  < > 182*  BUN 68* 69*  < > 51*  CREATININE 2.12* 2.08*  < > 1.91*  CALCIUM 7.7* 7.3*  < > 7.7*  MG  --  2.3  --   --   AST 30  --   --   --   ALT 22  --   --   --   ALKPHOS 47  --   --   --   BILITOT 0.2*  --   --   --   < > = values in this interval not displayed. ------------------------------------------------------------------------------------------------------------------  Cardiac Enzymes No results for input(s): TROPONINI in the last 168 hours. ------------------------------------------------------------------------------------------------------------------  RADIOLOGY:  Dg Pelvis 1-2 Views  Result Date: 07/04/2016 CLINICAL DATA:  Pain. EXAM: PELVIS - 1-2 VIEW COMPARISON:  None. FINDINGS: There is no evidence of pelvic fracture or diastasis. No pelvic bone lesions are seen. IMPRESSION: Negative. Electronically Signed   By: Misty Stanley M.D.   On: 07/04/2016 12:19   Dg Chest [facility]  1 View  Result Date: 07/03/2016 CLINICAL DATA:  Status post thoracentesis EXAM: PORTABLE CHEST 1 VIEW COMPARISON:  07/01/2016 FINDINGS: There is no evidence of pneumothorax post left thoracentesis. Lungs remain under aerated. Fibrotic pulmonary opacities are seen bilaterally. Upper normal heart size. Left jugular venous catheter is  stable. IMPRESSION: No pneumothorax post thoracentesis. Electronically Signed   By: Marybelle Killings M.D.   On: 07/03/2016 14:54   US Thoracentesis Asp Pleural Space W/img Guide  Result Date: 07/03/2016 INDICATION: No pleural effusion EXAM: ULTRASOUND GUIDED LEFT THORACENTESIS MEDICATIONS: None. COMPLICATIONS: None immediate. PROCEDURE: An ultrasound guided thoracentesis was thoroughly discussed with the patient and questions answered. The benefits, risks, alternatives and complications were also discussed. The patient understands and wishes to proceed with the procedure. Written consent was obtained. Ultrasound was performed to localize and mark an adequate pocket of fluid in the left chest. The area was then prepped and draped in the normal sterile fashion. 1% Lidocaine was used for local anesthesia. Under ultrasound guidance a Safe-T-Centesis catheter was introduced. Thoracentesis was performed. The catheter was removed and a dressing applied. FINDINGS: A total of approximately 750 cc of serosanguineous fluid was removed. Samples were sent to the laboratory as requested by the clinical team. IMPRESSION: Successful ultrasound guided left thoracentesis yielding 750 cc of pleural fluid. Electronically Signed   By: Marybelle Killings M.D.   On: 07/03/2016 14:56    ASSESSMENT AND PLAN:   Active Problems:   Sepsis ([place])   Blood in stool   Chronic duodenal ulcer with hemorrhage   Gastritis without bleeding  * Sepsis, acute on chronic bronchitis WBC continues to be elevated. Source not really clear. Had thoracentesis yesterday cultures pending I will order a WBC tagged white blood scan, Appreciate id input  * Pelvic pain I ordered an x-ray of his pelvis earlier no fracture noted supportive care likely from sitting in bed   * Acute blood loss anemia, upper GI bleed, gastric ulcer   Hemoglobin stable  * Acute kidney injury on chronic kidney stage disease 3   Continue monitoring, stable.  * Obstructive  sleep apnea, underlying pulmonary fibrosis   * Paroxysmal atrial fibrillation   Chronic angulation use before admission,  Due to gastric ulcer and GI bleed would not use anticoagulation for now  Discussed with the daughter regarding his overall prognosis she is receptive to palliative care team discussing with her regarding further plan of care and CODE STATUS  All the records are reviewed and case discussed with Care Management/Social Workerr. Management plans discussed with the patient, family and they are in agreement.  CODE STATUS: Full code.  TOTAL TIME TAKING CARE OF THIS PATIENT: 32 minutes.  Discussed with patient's son who was present at the time of visit.  POSSIBLE D/C IN1-2 DAYS, DEPENDING ON CLINICAL CONDITION.   Dustin Flock M.D on 07/04/2016   Between 7am to 6pm - Pager - [phone]  After 6pm go to [email or web address] - password [email or web address] [facility]  Office  [phone]  CC: Primary care physician; Pcp Not In System  Note: This dictation was prepared with Dragon dictation along with smaller phrase technology. Any transcriptional errors that result from this process are unintentional.

## 2016-07-04 NOTE — Progress Notes (Signed)
ANTIBIOTIC NOTE  Pharmacy Consult for Cefepime and Vancomycin Indication: Pneumonia   No Known Allergies  Patient Measurements: Height: 5\' 9"  (175.3 cm) Weight: 228 lb 14.4 oz (103.8 kg) IBW/kg (Calculated) : 70.7  Vital Signs: Temp: 97.4 F (36.3 C) (03/13 1212) Temp Source: Oral (03/13 1212) BP: 98/73 (03/13 1212) Pulse Rate: 73 (03/13 1212) Intake/Output from previous day: 03/12 0701 - 03/13 0700 In: 160 [P.O.:160] Out: 1000 [Urine:1000] Intake/Output from this shift: No intake/output data recorded.  Labs:  Recent Labs  07/02/16 0534 07/04/16 0445  WBC 20.5* 21.0*  HGB 7.9* 8.1*  PLT 284 277  CREATININE 2.07* 1.91*   Estimated Creatinine Clearance: 34.8 mL/min (by C-G formula based on SCr of 1.91 mg/dL (H)). No results for input(s): VANCOTROUGH, VANCOPEAK, VANCORANDOM, GENTTROUGH, GENTPEAK, GENTRANDOM, TOBRATROUGH, TOBRAPEAK, TOBRARND, AMIKACINPEAK, AMIKACINTROU, AMIKACIN in the last 72 hours.   Microbiology: Recent Results (from the past 720 hour(s))  C difficile quick scan w PCR reflex     Status: None   Collection Time: 06/26/16  7:23 PM  Result Value Ref Range Status   C Diff antigen NEGATIVE NEGATIVE Final   C Diff toxin NEGATIVE NEGATIVE Final   C Diff interpretation No C. difficile detected.  Final  Blood Culture (routine x 2)     Status: None   Collection Time: 06/26/16  7:50 PM  Result Value Ref Range Status   Specimen Description BLOOD L AC  Final   Special Requests BOTTLES DRAWN AEROBIC AND ANAEROBIC BCAV  Final   Culture NO GROWTH 5 DAYS  Final   Report Status 07/01/2016 FINAL  Final  Blood Culture (routine x 2)     Status: None   Collection Time: 06/26/16  7:50 PM  Result Value Ref Range Status   Specimen Description BLOOD  R AC  Final   Special Requests BOTTLES DRAWN AEROBIC AND ANAEROBIC BCAV  Final   Culture NO GROWTH 5 DAYS  Final   Report Status 07/01/2016 FINAL  Final  Urine culture     Status: None   Collection Time: 06/27/16  9:03  AM  Result Value Ref Range Status   Specimen Description URINE, RANDOM  Final   Special Requests NONE  Final   Culture   Final    NO GROWTH Performed at [facility], [place] [address], [place], [place] [address]    Report Status 06/28/2016 FINAL  Final  MRSA PCR Screening     Status: Abnormal   Collection Time: 06/27/16  8:36 PM  Result Value Ref Range Status   MRSA by PCR POSITIVE (A) NEGATIVE Final    Comment:        The GeneXpert MRSA Assay (FDA approved for NASAL specimens only), is one component of a comprehensive MRSA colonization surveillance program. It is not intended to diagnose MRSA infection nor to guide or monitor treatment for MRSA infections. RESULT CALLED TO, READ BACK BY AND VERIFIED WITH: TAKERA MESNICT 06/27/16 @ 2148  [place]   Body fluid culture     Status: None (Preliminary result)   Collection Time: 07/03/16  2:25 PM  Result Value Ref Range Status   Specimen Description PLEURAL  Final   Special Requests NONE  Final   Gram Stain   Final    ABUNDANT WBC PRESENT,BOTH PMN AND MONONUCLEAR NO ORGANISMS SEEN    Culture   Final    NO GROWTH < 24 HOURS Performed at [facility], [place] [address] [place], [place] [address]  Report Status PENDING  Incomplete    Medical History: Past Medical History:  Diagnosis Date  . Atrial fibrillation ([place])   . CAD (coronary artery disease)   . Diabetes mellitus without complication ([place])   . GI bleed   . Hypertension   . Thyroid disease     Medications:  Prescriptions Prior to Admission  Medication Sig Dispense Refill Last Dose  . amoxicillin-clavulanate (AUGMENTIN) 875-125 MG tablet Take 1 tablet by mouth 2 (two) times daily.   06/26/2016 at Unknown time  . aspirin EC 81 MG tablet Take 1 tablet by mouth daily.   06/26/2016 at Unknown time  . carvedilol (COREG) 6.25 MG tablet Take 6.25 mg by mouth 2 (two) times daily.   06/26/2016 at Unknown time  . ELIQUIS 5 MG TABS tablet Take 0.5 tablets by mouth 2 (two)  times daily.    06/26/2016 at 1500  . finasteride (PROSCAR) 5 MG tablet Take 5 mg by mouth daily.   06/26/2016 at Unknown time  . glipiZIDE (GLUCOTROL XL) 10 MG 24 hr tablet Take 10 mg by mouth daily with breakfast.   06/26/2016 at Unknown time  . guaiFENesin (MUCINEX) 600 MG 12 hr tablet Take 2 tablets by mouth 2 (two) times daily.   prn at prn  . insulin detemir (LEVEMIR) 100 UNIT/ML injection Inject into the skin 3 (three) times daily. Sliding scale   06/26/2016 at Unknown time  . ipratropium-albuterol (DUONEB) 0.5-2.5 (3) MG/3ML SOLN Inhale 3 mLs into the lungs 2 (two) times daily.    06/26/2016 at Unknown time  . levothyroxine (SYNTHROID, LEVOTHROID) 75 MCG tablet Take 75 mcg by mouth daily.   06/26/2016 at Unknown time  . montelukast (SINGULAIR) 10 MG tablet Take 1 tablet by mouth daily.   06/26/2016 at Unknown time  . rosuvastatin (CRESTOR) 20 MG tablet Take 20 mg by mouth daily.   06/25/2016 at Unknown time  . glipiZIDE (GLUCOTROL) 5 MG tablet Take 5 mg by mouth 2 (two) times daily.     Marland Kitchen HUMALOG KWIKPEN 100 UNIT/ML KiwkPen Take 12 Units by mouth 3 (three) times daily.     Marland Kitchen lisinopril-hydrochlorothiazide (PRINZIDE,ZESTORETIC) 20-12.5 MG tablet Take 1 tablet by mouth daily.     . polycarbophil (FIBERCON) 625 MG tablet Take 625 mg by mouth daily as needed.      Scheduled:  . carvedilol  3.125 mg Oral BID  . ceFEPIme (MAXIPIME) 2 GM IVP  2 g Intravenous Q12H  . diltiazem  60 mg Oral Q12H  . feeding supplement (ENSURE ENLIVE)  237 mL Oral BID BM  . finasteride  5 mg Oral Daily  . insulin aspart  0-15 Units Subcutaneous TID WC  . insulin aspart  0-5 Units Subcutaneous QHS  . levothyroxine  75 mcg Oral QAC breakfast  . pantoprazole  40 mg Oral BID  . rosuvastatin  20 mg Oral Daily  . sodium chloride flush  10-40 mL Intracatheter Q12H  . sodium chloride flush  3 mL Intravenous Q12H  . vancomycin  1,000 mg Intravenous Q24H   Assessment: 83 y/o M with a h/o CAD, AF, CKD with recent treatment for PNA.  Pharmacy consulted to dose cefepime. Vancomycin added 3/9.   Plan:  PCT has trended down 1.64>0.63>0.17. Today is day 8 of cefepime and day 5 of vancomycin. Pleural fluid- no organisms seen and NG <24hr. Bcx and Ucx neg. Per Dr. Ola Spurr ok to put a stop date on abx for tomorrow 3/14 and follow up pleural fluid cx.  Will continue cefepime dosing to 2 g iv q 12 hours stop date 3/14  Will continue Vancomycin 1000 mg iv q 24 hours. Since stop date is 3/14 no need to check trough  Pharmacy will continue to monitor and adjust per consult.   Ramond Dial, Pharm.D, BCPS Clinical Pharmacist  07/04/2016,2:31 PM

## 2016-07-04 NOTE — Clinical Social Work Note (Signed)
CSW continuing to follow patient's progress throughout discharge planning and awaiting [place] number.  Patient plans to go to SNF at [facility] for short term rehab then going to move in with patient's daughter.  CSW received consult to have palliative follow patient at SNF.  Jones Broom. [place], MSW, [place]  07/04/2016 4:25 PM

## 2016-07-04 NOTE — Consult Note (Signed)
Consultation Note Date: 07/04/2016   Patient Name: Glenn Reeves  DOB: 24-Jul-1930  MRN: [id number]  Age / Sex: 83 y.o., male  PCP: Pcp Not In System Referring Physician: Dustin Flock, MD  Reason for Consultation: Establishing goals of care  HPI/Patient Profile: 83 y.o. male  with past medical history of hypertension, GI bleed, thyroid disease, diabetes, CAD, CKD stage III, OSA, pulmonary fibrosis, and afib admitted on 06/26/2016 with weakness, fall, and diarrhea. Recently hospitalized in [place] for pneumonia. In ED, found to have bibasilar pneumonia, leukocytosis, tachycardia, and lactic acid of 3.9. Infectious disease following. WBC continues to be elevated with unknown source. Thoracentesis 3/12. This hospitalization has been complicated by afib RVR and upper GI bleed due to gastric ulcer. GI has evaluated and ulcer clipped. Hemoglobin stable with no further bleeding. Pelvic pain-xray negative. Palliative medicine consultation for goals of care/failure to thrive.  Clinical Assessment and Goals of Care: I have reviewed medical records and met with patient and daughter Glenn Reeves) at bedside to discuss diagnosis, prognosis, GOC, EOL wishes, disposition and options.  Introduced Palliative Medicine as specialized medical care for people living with serious illness. It focuses on providing relief from the symptoms and stress of a serious illness. The goal is to improve quality of life for both the patient and the family.  We discussed a brief life review of the patient. His wife died 5 years ago. Glenn Reeves is his only child. The patient was living independently in [facility] with supportive neighbors until hospitalization in January for pneumonia. The patient went to rehab and relocated to [facility] to live with daughter temporarily, when he developed weakness and diarrhea the day of admission.   Discussed current  hospitalization and underlying co-morbidities that contribute to decline. Advanced directives, concepts specific to code status, and artifical feeding were considered and discussed. The patient has spoken of his wishes for wanting to be resuscitated and placed on life support if necessary but WOULD NOT want this long term. Glenn Reeves tells me he trusts her to make the decision when appropriate. Educated on my recommendation for DNR/DNI with age and chronic co-morbidities. I have given Glenn Reeves a copy of Hard Choices to review regarding these big decisions she may be faced with in the future.   Glenn Reeves becomes very tearful during the conversation. She seems to understand her father will likely NOT get back to living independently. She understands his body has greatly deconditioned since admission and also has poor nutritional status. Now that pelvic x-ray is confirmed negative, Glenn Reeves is hopeful that he will be able to get out of bed and work with physical therapy. She is hopeful for improvement at rehab and to eventually bring him back home.   Palliative Care services outpatient were explained and offered. Glenn Reeves is agreeable with palliative to follow at [facility].    SUMMARY OF RECOMMENDATIONS    Remains FULL code. I have educated on recommendation for DNR/DNI with age and chronic co-morbidities. Also provided Hard Choices copy to review.   Daughter hopeful that he  will participate with physical therapy.   Agreeable with palliative services to follow at SNF to continue Ray conversations.  PMT will continue to support patient and family through hospitalization.  Code Status/Advance Care Planning:  Full code   Symptom Management:   Per attending  Palliative Prophylaxis:   Aspiration, Delirium Protocol, Frequent Pain Assessment, Oral Care and Turn Reposition  Additional Recommendations (Limitations, Scope, Preferences):  Full Scope Treatment  Psycho-social/Spiritual:   Desire for further  Chaplaincy support:no  Additional Recommendations: Caregiving  Support/Resources  Prognosis:   Unable to determine  Discharge Planning: [place] for rehab with Palliative care service follow-up      Primary Diagnoses: Present on Admission: . Sepsis ([place])   I have reviewed the medical record, interviewed the patient and family, and examined the patient. The following aspects are pertinent.  Past Medical History:  Diagnosis Date  . Atrial fibrillation ([place])   . CAD (coronary artery disease)   . Diabetes mellitus without complication ([place])   . GI bleed   . Hypertension   . Thyroid disease    Social History   Social History  . Marital status: Widowed    Spouse name: N/A  . Number of children: N/A  . Years of education: N/A   Social History Main Topics  . Smoking status: Never Smoker  . Smokeless tobacco: Never Used  . Alcohol use No  . Drug use: No  . Sexual activity: Not Asked   Other Topics Concern  . None   Social History Narrative  . None   Family History  Problem Relation Age of Onset  . Autoimmune disease Neg Hx    Scheduled Meds: . carvedilol  3.125 mg Oral BID  . ceFEPIme (MAXIPIME) 2 GM IVP  2 g Intravenous Q12H  . diltiazem  60 mg Oral Q12H  . feeding supplement (ENSURE ENLIVE)  237 mL Oral BID BM  . finasteride  5 mg Oral Daily  . insulin aspart  0-15 Units Subcutaneous TID WC  . insulin aspart  0-5 Units Subcutaneous QHS  . levothyroxine  75 mcg Oral QAC breakfast  . pantoprazole  40 mg Oral BID  . rosuvastatin  20 mg Oral Daily  . sodium chloride flush  10-40 mL Intracatheter Q12H  . sodium chloride flush  3 mL Intravenous Q12H  . vancomycin  1,000 mg Intravenous Q24H   Continuous Infusions: PRN Meds:.acetaminophen **OR** acetaminophen, albuterol, docusate, guaiFENesin-codeine, HYDROcodone-acetaminophen, ipratropium-albuterol, [DISCONTINUED] ondansetron **OR** ondansetron (ZOFRAN) IV, polyethylene glycol, sodium chloride  flush Medications Prior to Admission:  Prior to Admission medications   Medication Sig Start Date End Date Taking? Authorizing Provider  amoxicillin-clavulanate (AUGMENTIN) 875-125 MG tablet Take 1 tablet by mouth 2 (two) times daily. 06/26/16  Yes Historical Provider, MD  aspirin EC 81 MG tablet Take 1 tablet by mouth daily.   Yes Historical Provider, MD  carvedilol (COREG) 6.25 MG tablet Take 6.25 mg by mouth 2 (two) times daily. 06/13/16  Yes Historical Provider, MD  ELIQUIS 5 MG TABS tablet Take 0.5 tablets by mouth 2 (two) times daily.  06/07/16  Yes Historical Provider, MD  finasteride (PROSCAR) 5 MG tablet Take 5 mg by mouth daily. 06/07/16  Yes Historical Provider, MD  glipiZIDE (GLUCOTROL XL) 10 MG 24 hr tablet Take 10 mg by mouth daily with breakfast.   Yes Historical Provider, MD  guaiFENesin (MUCINEX) 600 MG 12 hr tablet Take 2 tablets by mouth 2 (two) times daily. 05/24/16  Yes Historical Provider, MD  insulin detemir (  LEVEMIR) 100 UNIT/ML injection Inject into the skin 3 (three) times daily. Sliding scale   Yes Historical Provider, MD  ipratropium-albuterol (DUONEB) 0.5-2.5 (3) MG/3ML SOLN Inhale 3 mLs into the lungs 2 (two) times daily.  05/25/16  Yes Historical Provider, MD  levothyroxine (SYNTHROID, LEVOTHROID) 75 MCG tablet Take 75 mcg by mouth daily. 06/07/16  Yes Historical Provider, MD  montelukast (SINGULAIR) 10 MG tablet Take 1 tablet by mouth daily. 06/07/16  Yes Historical Provider, MD  rosuvastatin (CRESTOR) 20 MG tablet Take 20 mg by mouth daily. 06/13/16  Yes Historical Provider, MD  glipiZIDE (GLUCOTROL) 5 MG tablet Take 5 mg by mouth 2 (two) times daily. 06/07/16   Historical Provider, MD  HUMALOG KWIKPEN 100 UNIT/ML KiwkPen Take 12 Units by mouth 3 (three) times daily. 06/07/16   Historical Provider, MD  lisinopril-hydrochlorothiazide (PRINZIDE,ZESTORETIC) 20-12.5 MG tablet Take 1 tablet by mouth daily.    Historical Provider, MD  polycarbophil (FIBERCON) 625 MG tablet Take 625  mg by mouth daily as needed.    Historical Provider, MD   No Known Allergies Review of Systems  Unable to perform ROS: Acuity of condition   Physical Exam  Constitutional: He is cooperative.  HENT:  Head: Normocephalic and atraumatic.  Cardiovascular: An irregularly irregular rhythm present.  Pulmonary/Chest: Effort normal. He has decreased breath sounds.  Abdominal: Normal appearance.  Neurological: He is alert.  Oriented to person and place  Skin: Skin is warm and dry. There is pallor.  Psychiatric: His speech is delayed.  Hard of hearing He is inattentive.  Nursing note and vitals reviewed.  Vital Signs: BP 98/73 (BP Location: Right Arm)   Pulse 73   Temp 97.4 F (36.3 C) (Oral)   Resp 19   Ht '5\' 9"'  (1.753 m)   Wt 103.8 kg (228 lb 14.4 oz)   SpO2 97%   BMI 33.80 kg/m  Pain Assessment: 0-10 POSS *See Group Information*: S-Acceptable,Sleep, easy to arouse Pain Score: Asleep  SpO2: SpO2: 97 % O2 Device:SpO2: 97 % O2 Flow Rate: .   IO: Intake/output summary:   Intake/Output Summary (Last 24 hours) at 07/04/16 1557 Last data filed at 07/04/16 0455  Gross per 24 hour  Intake                0 ml  Output              900 ml  Net             -900 ml    LBM: Last BM Date: 07/03/16 Baseline Weight: Weight: 106.1 kg (234 lb) Most recent weight: Weight: 103.8 kg (228 lb 14.4 oz)     Palliative Assessment/Data: PPS 30%   Flowsheet Rows   Flowsheet Row Most Recent Value  Intake Tab  Referral Department  Hospitalist  Unit at Time of Referral  Cardiac/Telemetry Unit  Palliative Care Primary Diagnosis  Sepsis/Infectious Disease  Date Notified  07/04/16  Palliative Care Type  New Palliative care  Reason for referral  Clarify Goals of Care  Date of Admission  06/26/16  Date first seen by Palliative Care  07/04/16  # of days IP prior to Palliative referral  8  Clinical Assessment  Palliative Performance Scale Score  30%  Psychosocial & Spiritual Assessment    Palliative Care Outcomes  Patient/Family meeting held?  Yes  Who was at the meeting?  patient and daughter  Palliative Care Outcomes  Clarified goals of care, Provided psychosocial or spiritual support, ACP counseling assistance, Linked  to palliative care logitudinal support      Time In: 1445 Time Out: 1600 Time Total: 59mn Greater than 50%  of this time was spent counseling and coordinating care related to the above assessment and plan.  Signed by:  MIhor Dow FNP-C Palliative Medicine Team  Phone: [phone]: [phone] Please contact Palliative Medicine Team phone at [phone] questions and concerns.  For individual provider: See AShea Evans

## 2016-07-05 ENCOUNTER — Inpatient Hospital Stay: Payer: Medicare Other

## 2016-07-05 ENCOUNTER — Encounter: Payer: Self-pay | Admitting: Radiology

## 2016-07-05 LAB — BASIC METABOLIC PANEL
ANION GAP: 4 — AB (ref 5–15)
BUN: 48 mg/dL — ABNORMAL HIGH (ref 6–20)
CALCIUM: 7.7 mg/dL — AB (ref 8.9–10.3)
CO2: 21 mmol/L — ABNORMAL LOW (ref 22–32)
Chloride: 112 mmol/L — ABNORMAL HIGH (ref 101–111)
Creatinine, Ser: 2.19 mg/dL — ABNORMAL HIGH (ref 0.61–1.24)
GFR, EST AFRICAN AMERICAN: 30 mL/min — AB (ref >60)
GFR, EST NON AFRICAN AMERICAN: 26 mL/min — AB (ref >60)
Glucose, Bld: 201 mg/dL — ABNORMAL HIGH (ref 65–99)
POTASSIUM: 4.6 mmol/L (ref 3.5–5.1)
SODIUM: 137 mmol/L (ref 135–145)

## 2016-07-05 LAB — CBC
HEMATOCRIT: 24 % — AB (ref 40.0–52.0)
Hemoglobin: 8 g/dL — ABNORMAL LOW (ref 13.0–18.0)
MCH: 29.6 pg (ref 26.0–34.0)
MCHC: 33.2 g/dL (ref 32.0–36.0)
MCV: 89.1 fL (ref 80.0–100.0)
PLATELETS: 305 10*3/uL (ref 150–440)
RBC: 2.69 MIL/uL — ABNORMAL LOW (ref 4.40–5.90)
RDW: 16.3 % — AB (ref 11.5–14.5)
WBC: 20 10*3/uL — ABNORMAL HIGH (ref 3.8–10.6)

## 2016-07-05 LAB — CYTOLOGY - NON PAP

## 2016-07-05 LAB — GLUCOSE, CAPILLARY
GLUCOSE-CAPILLARY: 181 mg/dL — AB (ref 65–99)
GLUCOSE-CAPILLARY: 251 mg/dL — AB (ref 65–99)
Glucose-Capillary: 123 mg/dL — ABNORMAL HIGH (ref 65–99)

## 2016-07-05 LAB — PROCALCITONIN: Procalcitonin: 0.12 ng/mL

## 2016-07-05 MED ORDER — LEVOFLOXACIN 500 MG PO TABS: 500.0000 mg | ORAL_TABLET | Freq: Every day | ORAL | 0 refills | Status: DC

## 2016-07-05 MED ORDER — SUCRALFATE 1 GM/10ML PO SUSP: 1.0000 g | Freq: Three times a day (TID) | ORAL | Status: DC

## 2016-07-05 MED ORDER — ASPIRIN EC 81 MG PO TBEC: 81.0000 mg | DELAYED_RELEASE_TABLET | Freq: Every day | ORAL | Status: DC

## 2016-07-05 MED ORDER — ENSURE ENLIVE PO LIQD: 237.0000 mL | Freq: Two times a day (BID) | ORAL | 12 refills | Status: DC

## 2016-07-05 MED ORDER — HEPARIN SOD (PORK) LOCK FLUSH 100 UNIT/ML IV SOLN
INTRAVENOUS | Status: AC
  Filled 2016-07-05: qty 5

## 2016-07-05 MED ORDER — TECHNETIUM TC 99M EXAMETAZIME IV KIT
21.0000 | PACK | Freq: Once | INTRAVENOUS | Status: AC | PRN
  Administered 2016-07-05: 21 via INTRAVENOUS

## 2016-07-05 MED ORDER — PANTOPRAZOLE SODIUM 40 MG PO TBEC: 40.0000 mg | DELAYED_RELEASE_TABLET | Freq: Two times a day (BID) | ORAL | 0 refills | Status: DC

## 2016-07-05 MED ORDER — LEVOFLOXACIN 500 MG PO TABS: 500.0000 mg | ORAL_TABLET | Freq: Every day | ORAL | 0 refills | Status: AC

## 2016-07-05 MED ORDER — DILTIAZEM HCL ER 60 MG PO CP12: 60.0000 mg | ORAL_CAPSULE | Freq: Two times a day (BID) | ORAL | Status: DC

## 2016-07-05 MED ORDER — SUCRALFATE 1 GM/10ML PO SUSP: 1.0000 g | Freq: Three times a day (TID) | ORAL | 0 refills | Status: DC

## 2016-07-05 NOTE — Clinical Social Work Note (Signed)
Patient to be d/c'ed today to [facility]. Patient and family agreeable to plans will transport via ems RN to call report to [phone] room 315.  Evette Cristal, MSW, [place]

## 2016-07-05 NOTE — Progress Notes (Signed)
Physical Therapy Treatment Patient Details Name: Glenn Reeves MRN: [id number] DOB: December 05, 1930 Today's Date: 07/05/2016    History of Present Illness Pt is an 83 yo male admitted to [facility] due to weakness, progressive SOB, fall and diarrhea, diagnosed with sepsis with bibasilar pneumonia. PMH includes; CAD, A-fib, HTN, CKD III, Pneumonia, UTI, DM, chronic back pain and GI bleed. Pt now with complicated hospital stay secondary to spontaneous GI bleed, rapid response event called and intubation on 3/8 secondary to EGD. Pt extubated later on 3/8, however has difficulty keepings sats WNL while on room air. Pt re-evaluated this date.    PT Comments    Pt stated he was feeling down today but willing to participate in PT session. Educated patient and daughter on exercise program and having the patient perform therex every hour to improve overall strength. Pt displayed improved strength for bed mobility but still requires mod assist to move from supine to sitting. He was able to maintain sitting posture for 5 mins w/ UE support but limited by fatigue and drop in blood pressure. Did not attempt transferring today due to low blood pressure; Blood pressure in supine was 96/42 in supine and 87/37 in sitting w/o symptoms of dizziness. Pt is progressing slowly but still severely limited in mobility due to decreased strength and activity tolerance, he will continue to benefit from skilled PT to correct deficits, recommend STR following hospital stay.     Follow Up Recommendations  SNF     Equipment Recommendations  None recommended by PT    Recommendations for Other Services OT consult     Precautions / Restrictions Precautions Precautions: Fall Restrictions Weight Bearing Restrictions: No    Mobility  Bed Mobility Overal bed mobility: Needs Assistance Bed Mobility: Supine to Sit;Sit to Supine     Supine to sit: Mod assist;HOB elevated Sit to supine: Max assist   General bed mobility  comments: pt able to participate more in bed mobility still requires mod assist to move LE and assist bringing trunk to upright position  Transfers                 General transfer comment: not attempted this session due to blood pressure   Ambulation/Gait                 Stairs            Wheelchair Mobility    Modified Rankin (Stroke Patients Only)       Balance Overall balance assessment: Needs assistance Sitting-balance support: Feet supported;Bilateral upper extremity supported Sitting balance-Leahy Scale: Fair Sitting balance - Comments: demonstrated improved sitting balance, limited due to fatigue and SOB, occasional min assist  Postural control: Posterior lean                          Cognition Arousal/Alertness: Awake/alert Behavior During Therapy: WFL for tasks assessed/performed Overall Cognitive Status: Within Functional Limits for tasks assessed                      Exercises General Exercises - Lower Extremity Heel Slides: AROM;Strengthening;Both;10 reps;Supine Hip ABduction/ADduction: AROM;Strengthening;Both;10 reps;Supine Straight Leg Raises: AROM;Strengthening;Both;10 reps;Supine Other Exercises Other Exercises: supine therex; bridges; AROM for strengthening of LE for functional tasks; 1x10, requires cuing and encouragment, unable to fully elevate hips off of bed    General Comments        Pertinent Vitals/Pain Pain Assessment: No/denies pain    Home Living  Prior Function            PT Goals (current goals can now be found in the care plan section) Acute Rehab PT Goals Patient Stated Goal: To get better and return home PT Goal Formulation: With patient Time For Goal Achievement: 07/14/16 Potential to Achieve Goals: Fair Progress towards PT goals: Progressing toward goals    Frequency    Min 2X/week      PT Plan Current plan remains appropriate    Co-evaluation              End of Session   Activity Tolerance: Patient limited by fatigue;Treatment limited secondary to medical complications (Comment) (low blood pressure) Patient left: in bed;with call bell/phone within reach;with bed alarm set;with family/visitor present Nurse Communication: Mobility status PT Visit Diagnosis: Unsteadiness on feet (R26.81);Muscle weakness (generalized) (M62.81);History of falling (Z91.81);Difficulty in walking, not elsewhere classified (R26.2)     Time: 2800-3491 PT Time Calculation (min) (ACUTE ONLY): 23 min  Charges:                       G Codes:       [employer] Student PT 07/05/2016, 12:15 PM

## 2016-07-05 NOTE — Clinical Social Work Note (Signed)
CSW received passar number for patient it is [phone].  CSW to continue to follow patient's progress throughout discharge planning.  Jones Broom. Crosby, MSW, [place]  07/05/2016 9:48 AM

## 2016-07-05 NOTE — Care Management Important Message (Signed)
Important Message  Patient Details  Name: Glenn Reeves MRN: [id number] Date of Birth: 11/24/1930   Medicare Important Message Given:  Yes Initial signed IM printed from Epic and given.    Katrina Stack, RN 07/05/2016, 9:59 AM

## 2016-07-05 NOTE — Discharge Instructions (Signed)
[place] at [place]:  Cardiac diet, diabetic  DISCHARGE CONDITION:  Fair  ACTIVITY:  Activity as tolerated  OXYGEN:  Home Oxygen: No.   Oxygen Delivery: room air  DISCHARGE LOCATION:  nursing home    ADDITIONAL DISCHARGE INSTRUCTION: pallative care to follow patient at the facilty   If you experience worsening of your admission symptoms, develop shortness of breath, life threatening emergency, suicidal or homicidal thoughts you must seek medical attention immediately by calling 911 or calling your MD immediately  if symptoms less severe.  You Must read complete instructions/literature along with all the possible adverse reactions/side effects for all the Medicines you take and that have been prescribed to you. Take any new Medicines after you have completely understood and accpet all the possible adverse reactions/side effects.   Please note  You were cared for by a hospitalist during your hospital stay. If you have any questions about your discharge medications or the care you received while you were in the hospital after you are discharged, you can call the unit and asked to speak with the hospitalist on call if the hospitalist that took care of you is not available. Once you are discharged, your primary care physician will handle any further medical issues. Please note that NO REFILLS for any discharge medications will be authorized once you are discharged, as it is imperative that you return to your primary care physician (or establish a relationship with a primary care physician if you do not have one) for your aftercare needs so that they can reassess your need for medications and monitor your lab values.

## 2016-07-05 NOTE — Progress Notes (Signed)
Discontinued central line per MD order and protocol. Patient tolerated procedure. No bleeding noted from insertion site. 16 noted on line and tip was intact when discontinued. I will continue to assess.

## 2016-07-05 NOTE — Progress Notes (Signed)
New referral for Palliative to follow at [facility] term rehab received from Palliative Medicine NP Ihor Dow.Referral made aware. Thank you. Flo Shanks RN, BSN, [place] and [facility] [place], [facility] [phone] c

## 2016-07-05 NOTE — Progress Notes (Signed)
MD notified. RN was delayed giving 8 a.m dose of insulin. New CBG shows 251 requiring 8 units of SSI. Orders from MD to give scheduled SSI now. I will continue to assess.

## 2016-07-05 NOTE — Progress Notes (Signed)
CH responded to a PG. RN suggested that Pt might appreciate a visit. Pt is on a ventilator and presented with difficulty of hearing. Pt spoke softly and his speech was sometimes inaudible. Pt did seem in some distress and desired prayer, which was provided. CH is available for follow up if desired.    07/05/16 1000  Clinical Encounter Type  Visited With Patient;Health care provider  Visit Type Initial;Spiritual support  Referral From Nurse  Consult/Referral To Chaplain  Spiritual Encounters  Spiritual Needs Prayer

## 2016-07-05 NOTE — Discharge Summary (Addendum)
[place] at [facility], 83 y.o., DOB 04/22/1931, MRN [id number]. Admission date: 06/26/2016 Discharge Date 07/05/2016 Primary MD Pcp Not In System Admitting Physician Hillary Bow, MD  Admission Diagnosis  Diarrhea of presumed infectious origin [A09] Seizure ([place]) [R56.9] Generalized weakness [R53.1] HCAP (healthcare-associated pneumonia) [J18.9] Sepsis, due to unspecified organism [facility]) [A41.9] Acute renal failure superimposed on chronic kidney disease, unspecified CKD stage, unspecified acute renal failure type ([place]) [N17.9, N18.9]  Discharge Diagnosis   Active Problems:   Sepsis ([place]) due to recurrent PNA   Acute GI bleeding   Chronic duodenal ulcer with hemorrhage   Gastritis without bleeding   HCAP (healthcare-associated pneumonia)   Generalized weakness   Palliative care by specialist  Persistent leukocytosis  Failure to thrive Hypothyroidism Atrial fibrillation Coronary artery disease Essential hypertension Chronic kidney disease Disease stage III      Hospital Course patient is a 83 year old male with a known history of coronary artery disease, atrial fibrillation, hypertension, chronic kidney disease stage III who was recently hospitalized in [place] from pneumonia and was subsequently brought to this area by his daughter. Patient presented to the emergency room due to weakness fall and diarrhea. Patient was noted to have bibasilar pneumonia leukocytosis tachycardia and a lactic acid level of 3.9. Patient was also noticed to have A. fib with RVR.  Patient developed hypotension and rectal bleed. He was on Eliquis which was stopped, he will remain on aspirin only He was transferred to the ICU. He required intubation. He underwent an endoscopy which showed one nonbleeding duodenal ulcer with adherent clot. Patient was subsequently extubated. And kept on PPIs. His hemoglobins remained stable. Patient had a CT scan of the  chest which showed pulmonary fibrosis and pleural effsusion. Patient continued to have leukocytosis therefore this was drained. He was seen by infectious disease. The cultures of the pleural effusion showed no growth. He was seen by infectious disease who recommended oral Levaquin for 5 more days. Patient is daughter has been involved in his care. And he was seen by palliative care she wanted him to remain full code. He will need aggressive palliative care follow-up at the facility to continue to discuss his current prognosis. Patient's overall prognosis is very poor.     Oral Levaquin duration 5 days from today        Consults  cardiology, intesivist, id  Significant Tests:  See full reports for all details     Dg Chest 2 View  Result Date: 06/26/2016 CLINICAL DATA:  Weakness for 3 days EXAM: CHEST  2 VIEW COMPARISON:  04/18/2006 FINDINGS: Fibrotic changes at the left greater than right lung bases. Tiny pleural effusions or thickening. Increased opacity at the left greater than right lung base suspicious for acute inflammatory process superimposed on chronic change. Mild cardiomegaly with atherosclerosis. No pneumothorax. Surgical clips at the left neck IMPRESSION: 1. Reticular opacities within the left greater than right bases suggestive of fibrosis. More confluent opacification at the left greater than right lung base is suspicious for superimposed acute inflammation or pneumonia. Small left pleural effusion or thickening 2. Cardiomegaly. Electronically Signed   By: Donavan Foil M.D.   On: 06/26/2016 19:10   Dg Pelvis 1-2 Views  Result Date: 07/04/2016 CLINICAL DATA:  Pain. EXAM: PELVIS - 1-2 VIEW COMPARISON:  None. FINDINGS: There is no evidence of pelvic fracture or diastasis. No pelvic bone lesions are seen. IMPRESSION: Negative. Electronically Signed   By: Verda Cumins.D.  On: 07/04/2016 12:19   Ct Head Wo Contrast  Result Date: 06/26/2016 CLINICAL DATA:  Weakness, status post  fall, abrasions EXAM: CT HEAD WITHOUT CONTRAST TECHNIQUE: Contiguous axial images were obtained from the base of the skull through the vertex without intravenous contrast. COMPARISON:  None. FINDINGS: Patient motion degrades image quality limiting evaluation. Brain: No evidence of acute infarction, hemorrhage, extra-axial collection, ventriculomegaly, or mass effect. Generalized cerebral atrophy. Periventricular white matter low attenuation likely secondary to microangiopathy. Vascular: Cerebrovascular atherosclerotic calcifications are noted. Skull: Negative for fracture or focal lesion. Sinuses/Orbits: Visualized portions of the orbits are unremarkable. Visualized portions of the paranasal sinuses and mastoid air cells are unremarkable. Other: None. IMPRESSION: No acute intracranial pathology. Electronically Signed   By: Kathreen Devoid   On: 06/26/2016 19:27   Ct Chest Wo Contrast  Result Date: 06/29/2016 CLINICAL DATA:  Respiratory failure and cough, generalized weakness, diarrhea, recent fall EXAM: CT CHEST WITHOUT CONTRAST TECHNIQUE: Multidetector CT imaging of the chest was performed following the standard protocol without IV contrast. COMPARISON:  Portable chest x-ray of 06/29/2016 and 06/26/2016 FINDINGS: Cardiovascular: On this unenhanced study, there is significant thoracic aortic atherosclerosis present. The mid ascending thoracic aorta measures 43 mm in diameter. Recommend annual imaging followup by CTA or MRA. This recommendation follows 2010 ACCF/AHA/AATS/ACR/ASA/SCA/SCAI/SIR/STS/SVM Guidelines for the Diagnosis and Management of Patients with Thoracic Aortic Disease. Circulation. 2010; 121: I680-H212. There is cardiomegaly present. Diffuse coronary artery calcifications are noted. No pericardial effusion is seen. Mediastinum/Nodes: On this unenhanced study, no mediastinal or hilar adenopathy is noted Lungs/Pleura: There peripheral opacity at the left lung base on chest x-ray appears to represent  loculated left pleural effusion. However, throughout the lungs there are marked changes of interstitial lung disease. Considerable honeycombing is present in the lung bases. Diffuse areas of fibrosis are present and there is some traction bronchiectasis within the lingula. The majority of the findings are peripheral and subpleural with a lower lobe predominance, typical of idiopathic pulmonary fibrosis. No definite pneumonia is seen. The central airway is patent. Upper Abdomen: Multiple splenic and hepatic calcified granulomas are present secondary to prior granulomatous disease. Surgical clips are noted from prior cholecystectomy. Musculoskeletal: The thoracic vertebrae are slightly kyphotic with diffuse degenerative change present throughout the thoracic spine. No compression deformity is seen. IMPRESSION: 1. Severe changes of interstitial lung disease as described above, most consistent with idiopathic pulmonary fibrosis. 2. Fusiform dilatation of the mid ascending thoracic aorta measuring 43 mm in diameter. Recommend annual imaging followup by CTA or MRA. This recommendation follows 2010 ACCF/AHA/AATS/ACR/ASA/SCA/SCAI/SIR/STS/SVM Guidelines for the Diagnosis and Management of Patients with Thoracic Aortic Disease. Circulation. 2010; 121: Y482-N003. 3. Diffuse coronary artery calcifications. 4. Changes of prior granulomatous disease. Electronically Signed   By: Ivar Drape M.D.   On: 06/29/2016 11:53   Dg Chest Port 1 View  Result Date: 07/03/2016 CLINICAL DATA:  Status post thoracentesis EXAM: PORTABLE CHEST 1 VIEW COMPARISON:  07/01/2016 FINDINGS: There is no evidence of pneumothorax post left thoracentesis. Lungs remain under aerated. Fibrotic pulmonary opacities are seen bilaterally. Upper normal heart size. Left jugular venous catheter is stable. IMPRESSION: No pneumothorax post thoracentesis. Electronically Signed   By: Marybelle Killings M.D.   On: 07/03/2016 14:54   Dg Chest Port 1 View  Result Date:  07/01/2016 CLINICAL DATA:  Respiratory failure EXAM: PORTABLE CHEST 1 VIEW COMPARISON:  Chest x-rays dated 06/30/2016 and 06/29/2016. Also CT chest of 06/29/2016. FINDINGS: Left IJ central line appears stable in position with tip overlying the upper  SVC. Stable cardiomegaly. Coarse interstitial markings again noted bilaterally, with more confluent opacities at each lung base, not significantly changed in the interval. No new lung findings seen. No pneumothorax seen. IMPRESSION: 1. No significant interval change. Bilateral pulmonary fibrosis, with superimposed pneumonia and/or loculated pleural effusion at the left lung base better demonstrated on earlier chest CT of 06/29/2016. 2. Stable cardiomegaly. 3. Left IJ central line stable in position with tip at the level of the upper SVC. Electronically Signed   By: Franki Cabot M.D.   On: 07/01/2016 08:06   Dg Chest Port 1 View  Result Date: 06/30/2016 CLINICAL DATA:  Acute respiratory failure. Sepsis. Atrial fibrillation. EXAM: PORTABLE CHEST 1 VIEW COMPARISON:  06/29/2016 FINDINGS: Endotracheal tube has been removed. A left jugular central venous catheter remains in appropriate position. Patient is rotated to the right. Bibasilar predominant pulmonary interstitial fibrosis in left pleural thickening remain stable. No evidence of superimposed pulmonary infiltrate. No evidence of pneumothorax. IMPRESSION: Stable bibasilar predominant pulmonary fibrosis and left pleural thickening. No acute findings. Electronically Signed   By: Earle Gell M.D.   On: 06/30/2016 09:04   Dg Chest Port 1 View  Result Date: 06/29/2016 CLINICAL DATA:  Endotracheal tube and central line placement. EXAM: PORTABLE CHEST 1 VIEW COMPARISON:  Chest CT June 29, 2016 at 11:18 hours FINDINGS: Endotracheal tube tip projects 3 cm above the carina. LEFT internal jugular central venous catheter tip projects in proximal superior vena cava. Stable cardiomegaly. Diffusely coarsened pulmonary  interstitium with pleural effusions/pleural thickening, unchanged. No pneumothorax. Surgical clips project in the RIGHT abdomen. Soft tissue planes and included osseous structures are unchanged. IMPRESSION: Endotracheal tube tip projects 3 cm above the carina. LEFT internal jugular central venous catheter tip projects in proximal superior vena cava. No pneumothorax. Stable cardiomegaly, pulmonary fibrosis and pleural thickening. Electronically Signed   By: Elon Alas M.D.   On: 06/29/2016 15:38   Dg Chest Port 1 View  Result Date: 06/29/2016 CLINICAL DATA:  Respiratory failure. EXAM: PORTABLE CHEST 1 VIEW COMPARISON:  06/26/2016 FINDINGS: The cardiomediastinal silhouette is unchanged. Lung volumes remain diminished with mild elevation of the right hemidiaphragm. Basilar predominant reticular densities, with asymmetric involvement of the left lung base, are similar to the prior study and suggestive of fibrosis. Patchy left basilar opacity is stable to minimally increased, and a small left pleural effusion also appears increased and may be partially loculated or associated with pleural thickening. No pneumothorax is identified. Surgical clips are present in the left neck. IMPRESSION: Background lung changes suggestive of fibrosis with stable to slightly increased asymmetric left basilar opacity which may reflect superimposed pneumonia. Small left pleural effusion, slightly increased. Electronically Signed   By: Logan Bores M.D.   On: 06/29/2016 09:37   US Thoracentesis Asp Pleural Space W/img Guide  Result Date: 07/03/2016 INDICATION: No pleural effusion EXAM: ULTRASOUND GUIDED LEFT THORACENTESIS MEDICATIONS: None. COMPLICATIONS: None immediate. PROCEDURE: An ultrasound guided thoracentesis was thoroughly discussed with the patient and questions answered. The benefits, risks, alternatives and complications were also discussed. The patient understands and wishes to proceed with the procedure. Written  consent was obtained. Ultrasound was performed to localize and mark an adequate pocket of fluid in the left chest. The area was then prepped and draped in the normal sterile fashion. 1% Lidocaine was used for local anesthesia. Under ultrasound guidance a Safe-T-Centesis catheter was introduced. Thoracentesis was performed. The catheter was removed and a dressing applied. FINDINGS: A total of approximately 750 cc of serosanguineous fluid was  removed. Samples were sent to the laboratory as requested by the clinical team. IMPRESSION: Successful ultrasound guided left thoracentesis yielding 750 cc of pleural fluid. Electronically Signed   By: Marybelle Killings M.D.   On: 07/03/2016 14:56       Today   Subjective:   Glenn Reeves  still weak but denies any other complaints  Objective:   Blood pressure (!) 97/39, pulse 69, temperature 98.3 F (36.8 C), resp. rate 18, height 5\' 9"  (1.753 m), weight 226 lb 11.2 oz (102.8 kg), SpO2 98 %.  .  Intake/Output Summary (Last 24 hours) at 07/05/16 1320 Last data filed at 07/05/16 0455  Gross per 24 hour  Intake              490 ml  Output              350 ml  Net              140 ml    Exam VITAL SIGNS: Blood pressure (!) 97/39, pulse 69, temperature 98.3 F (36.8 C), resp. rate 18, height 5\' 9"  (1.753 m), weight 226 lb 11.2 oz (102.8 kg), SpO2 98 %.  GENERAL:  83 y.o.-year-old patient lying in the bed with no acute distress.  EYES: Pupils equal, round, reactive to light and accommodation. No scleral icterus. Extraocular muscles intact.  HEENT: Head atraumatic, normocephalic. Oropharynx and nasopharynx clear.  NECK:  Supple, no jugular venous distention. No thyroid enlargement, no tenderness.  LUNGS: Normal breath sounds bilaterally, no wheezing, rales,rhonchi or crepitation. No use of accessory muscles of respiration.  CARDIOVASCULAR: S1, S2 normal. No murmurs, rubs, or gallops.  ABDOMEN: Soft, nontender, nondistended. Bowel sounds present. No  organomegaly or mass.  EXTREMITIES: No pedal edema, cyanosis, or clubbing.  NEUROLOGIC: Cranial nerves II through XII are intact. Muscle strength 5/5 in all extremities. Sensation intact. Gait not checked.  PSYCHIATRIC: The patient is alert and orientedto person  SKIN: No obvious rash, lesion, or ulcer.   Data Review     CBC w Diff:  Lab Results  Component Value Date   WBC 20.0 (H) 07/05/2016   HGB 8.0 (L) 07/05/2016   HCT 24.0 (L) 07/05/2016   PLT 305 07/05/2016   LYMPHOPCT 17 06/29/2016   MONOPCT 8 06/29/2016   EOSPCT 0 06/29/2016   BASOPCT 0 06/29/2016   CMP:  Lab Results  Component Value Date   NA 137 07/05/2016   K 4.6 07/05/2016   CL 112 (H) 07/05/2016   CO2 21 (L) 07/05/2016   BUN 48 (H) 07/05/2016   CREATININE 2.19 (H) 07/05/2016   PROT 5.4 (L) 06/30/2016   ALBUMIN 1.5 (L) 06/30/2016   BILITOT 0.2 (L) 06/30/2016   ALKPHOS 47 06/30/2016   AST 30 06/30/2016   ALT 22 06/30/2016  .  Micro Results Recent Results (from the past 240 hour(s))  C difficile quick scan w PCR reflex     Status: None   Collection Time: 06/26/16  7:23 PM  Result Value Ref Range Status   C Diff antigen NEGATIVE NEGATIVE Final   C Diff toxin NEGATIVE NEGATIVE Final   C Diff interpretation No C. difficile detected.  Final  Blood Culture (routine x 2)     Status: None   Collection Time: 06/26/16  7:50 PM  Result Value Ref Range Status   Specimen Description BLOOD L AC  Final   Special Requests BOTTLES DRAWN AEROBIC AND ANAEROBIC BCAV  Final   Culture NO GROWTH 5 DAYS  Final   Report Status 07/01/2016 FINAL  Final  Blood Culture (routine x 2)     Status: None   Collection Time: 06/26/16  7:50 PM  Result Value Ref Range Status   Specimen Description BLOOD  R AC  Final   Special Requests BOTTLES DRAWN AEROBIC AND ANAEROBIC BCAV  Final   Culture NO GROWTH 5 DAYS  Final   Report Status 07/01/2016 FINAL  Final  Urine culture     Status: None   Collection Time: 06/27/16  9:03 AM  Result  Value Ref Range Status   Specimen Description URINE, RANDOM  Final   Special Requests NONE  Final   Culture   Final    NO GROWTH Performed at [facility], [place] [address] [place], [place] [address]    Report Status 06/28/2016 FINAL  Final  MRSA PCR Screening     Status: Abnormal   Collection Time: 06/27/16  8:36 PM  Result Value Ref Range Status   MRSA by PCR POSITIVE (A) NEGATIVE Final    Comment:        The GeneXpert MRSA Assay (FDA approved for NASAL specimens only), is one component of a comprehensive MRSA colonization surveillance program. It is not intended to diagnose MRSA infection nor to guide or monitor treatment for MRSA infections. RESULT CALLED TO, READ BACK BY AND VERIFIED WITH: TAKERA MESNICT 06/27/16 @ 2148  Meta   Body fluid culture     Status: None (Preliminary result)   Collection Time: 07/03/16  2:25 PM  Result Value Ref Range Status   Specimen Description PLEURAL  Final   Special Requests NONE  Final   Gram Stain   Final    ABUNDANT WBC PRESENT,BOTH PMN AND MONONUCLEAR NO ORGANISMS SEEN    Culture   Final    NO GROWTH 2 DAYS Performed at [facility], [address] [place], [place] [address]    Report Status PENDING  Incomplete        Code Status Orders        Start     Ordered   06/26/16 2115  Full code  Continuous     06/26/16 2115    Code Status History    Date Active Date Inactive Code Status Order ID Comments User Context   This patient has a current code status but no historical code status.    Advance Directive Documentation   Flowsheet Row Most Recent Value  Type of Advance Directive  Healthcare Power of Attorney  Pre-existing out of facility DNR order (yellow form or pink MOST form)  No data  "MOST" Form in Place?  No data          Contact information for after-discharge care    Destination    [facility] .   Specialty:  [place] information: [place] [phone]              Discharge Medications   Allergies as of 07/05/2016   No Known Allergies     Medication List    STOP taking these medications   amoxicillin-clavulanate 875-125 MG tablet Commonly known as:  AUGMENTIN   ELIQUIS 5 MG Tabs tablet Generic drug:  apixaban   insulin detemir 100 UNIT/ML injection Commonly known as:  LEVEMIR   lisinopril-hydrochlorothiazide 20-12.5 MG tablet Commonly known as:  PRINZIDE,ZESTORETIC     TAKE these medications   aspirin EC 81 MG tablet Take  1 tablet by mouth daily. What changed:  Another medication with the same name was added. Make sure you understand how and when to take each.   aspirin EC 81 MG tablet Take 1 tablet (81 mg total) by mouth daily. What changed:  You were already taking a medication with the same name, and this prescription was added. Make sure you understand how and when to take each.   carvedilol 6.25 MG tablet Commonly known as:  COREG Take 6.25 mg by mouth 2 (two) times daily.   diltiazem 60 MG 12 hr capsule Commonly known as:  CARDIZEM SR Take 1 capsule (60 mg total) by mouth every 12 (twelve) hours.   feeding supplement (ENSURE ENLIVE) Liqd Take 237 mLs by mouth 2 (two) times daily between meals.   finasteride 5 MG tablet Commonly known as:  PROSCAR Take 5 mg by mouth daily.   glipiZIDE 10 MG 24 hr tablet Commonly known as:  GLUCOTROL XL Take 10 mg by mouth daily with breakfast. What changed:  Another medication with the same name was removed. Continue taking this medication, and follow the directions you see here.   guaiFENesin 600 MG 12 hr tablet Commonly known as:  MUCINEX Take 2 tablets by mouth 2 (two) times daily.   HUMALOG KWIKPEN 100 UNIT/ML KiwkPen Generic drug:  insulin lispro Take 12 Units by mouth 3 (three) times daily.   ipratropium-albuterol 0.5-2.5 (3) MG/3ML Soln Commonly known as:  DUONEB Inhale 3 mLs into the lungs 2 (two)  times daily.   levofloxacin 500 MG tablet Commonly known as:  LEVAQUIN Take 1 tablet (500 mg total) by mouth daily.   levothyroxine 75 MCG tablet Commonly known as:  SYNTHROID, LEVOTHROID Take 75 mcg by mouth daily.   montelukast 10 MG tablet Commonly known as:  SINGULAIR Take 1 tablet by mouth daily.   pantoprazole 40 MG tablet Commonly known as:  PROTONIX Take 1 tablet (40 mg total) by mouth 2 (two) times daily.   polycarbophil 625 MG tablet Commonly known as:  FIBERCON Take 625 mg by mouth daily as needed.   rosuvastatin 20 MG tablet Commonly known as:  CRESTOR Take 20 mg by mouth daily.          Total Time in preparing paper work, data evaluation and todays exam - 35 minutes  Dustin Flock M.D on 07/05/2016 at 1:20 PM  [facility]   Office  [phone]

## 2016-07-05 NOTE — Progress Notes (Signed)
MD notified via text about pts BP. Cardizem and coreg held. I will monitor for further orders. I will continue to assess.

## 2016-07-06 DIAGNOSIS — K922 Gastrointestinal hemorrhage, unspecified: Secondary | ICD-10-CM | POA: Diagnosis not present

## 2016-07-06 DIAGNOSIS — E1121 Type 2 diabetes mellitus with diabetic nephropathy: Secondary | ICD-10-CM

## 2016-07-06 DIAGNOSIS — J189 Pneumonia, unspecified organism: Secondary | ICD-10-CM

## 2016-07-06 DIAGNOSIS — I480 Paroxysmal atrial fibrillation: Secondary | ICD-10-CM

## 2016-07-06 DIAGNOSIS — N183 Chronic kidney disease, stage 3 (moderate): Secondary | ICD-10-CM

## 2016-07-06 DIAGNOSIS — J8410 Pulmonary fibrosis, unspecified: Secondary | ICD-10-CM

## 2016-07-06 LAB — BODY FLUID CULTURE: Culture: NO GROWTH

## 2016-07-10 ENCOUNTER — Telehealth: Payer: Self-pay

## 2016-07-10 NOTE — Telephone Encounter (Signed)
PLEASE NOTE: All timestamps contained within this report are represented as [place] Standard Time. CONFIDENTIALTY NOTICE: This fax transmission is intended only for the addressee. It contains information that is legally privileged, confidential or otherwise protected from use or disclosure. If you are not the intended recipient, you are strictly prohibited from reviewing, disclosing, copying using or disseminating any of this information or taking any action in reliance on or regarding this information. If you have received this fax in error, please notify us immediately by telephone so that we can arrange for its return to [place]. Phone: [phone], Toll-Free: [phone], Fax: [phone] Page: 1 of 1 Call Id: [id number] [place] Night - Client Nonclinical Telephone Record [place] Night - Client Client Site [place] Physician Viviana Simpler - MD Contact Type Call Who Is Calling Physician / Provider / Hospital Call Type Provider Call [facility] Page Now Reason for Call Request to speak to Physician Initial Comment Caller is nurse Laureen Ochs at [facility] trying to get in contact with on call for Dr Silvio Pate regarding a pt Additional Comment Patient Name Glenn Reeves Patient DOB Aug 14, 1930 Requesting Provider Laureen Ochs, RN Physician Number [phone] Facility Name [facility] Phone DateTime Result/Outcome Message Type Notes Howard Pouch [phone] 07/08/2016 3:40:32 PM Paged On Call Back to Call Center Doctor Paged Howard Pouch [phone] 07/08/2016 4:10:14 PM Called On Call Provider - Reached Doctor Paged Howard Pouch 07/08/2016 4:12:28 PM Spoke with On Call - General Message Result Spoke with the on call and connected with the facility. Call Closed By: Pearline Cables Transaction Date/Time: 07/08/2016 3:07:20 PM (ET)

## 2016-07-10 NOTE — Telephone Encounter (Signed)
Reviewed status at [facility] today. No ongoing concerns

## 2016-07-14 DIAGNOSIS — J189 Pneumonia, unspecified organism: Secondary | ICD-10-CM

## 2016-07-14 DIAGNOSIS — R0989 Other specified symptoms and signs involving the circulatory and respiratory systems: Secondary | ICD-10-CM | POA: Diagnosis not present

## 2016-07-14 DIAGNOSIS — R05 Cough: Secondary | ICD-10-CM | POA: Diagnosis not present

## 2016-07-17 DIAGNOSIS — I872 Venous insufficiency (chronic) (peripheral): Secondary | ICD-10-CM | POA: Diagnosis not present

## 2016-07-25 DIAGNOSIS — E1121 Type 2 diabetes mellitus with diabetic nephropathy: Secondary | ICD-10-CM

## 2016-07-25 DIAGNOSIS — N183 Chronic kidney disease, stage 3 (moderate): Secondary | ICD-10-CM

## 2016-07-25 DIAGNOSIS — J189 Pneumonia, unspecified organism: Secondary | ICD-10-CM | POA: Diagnosis not present

## 2016-07-25 DIAGNOSIS — J8410 Pulmonary fibrosis, unspecified: Secondary | ICD-10-CM

## 2016-07-25 DIAGNOSIS — I480 Paroxysmal atrial fibrillation: Secondary | ICD-10-CM

## 2016-07-28 DIAGNOSIS — E1122 Type 2 diabetes mellitus with diabetic chronic kidney disease: Secondary | ICD-10-CM

## 2016-07-28 DIAGNOSIS — K269 Duodenal ulcer, unspecified as acute or chronic, without hemorrhage or perforation: Secondary | ICD-10-CM | POA: Diagnosis not present

## 2016-07-28 DIAGNOSIS — I2510 Atherosclerotic heart disease of native coronary artery without angina pectoris: Secondary | ICD-10-CM | POA: Diagnosis not present

## 2016-07-28 DIAGNOSIS — I129 Hypertensive chronic kidney disease with stage 1 through stage 4 chronic kidney disease, or unspecified chronic kidney disease: Secondary | ICD-10-CM | POA: Diagnosis not present

## 2016-08-02 DIAGNOSIS — J8410 Pulmonary fibrosis, unspecified: Secondary | ICD-10-CM | POA: Insufficient documentation

## 2016-08-03 DIAGNOSIS — I4892 Unspecified atrial flutter: Secondary | ICD-10-CM

## 2016-08-03 DIAGNOSIS — N183 Chronic kidney disease, stage 3 unspecified: Secondary | ICD-10-CM | POA: Insufficient documentation

## 2016-08-03 DIAGNOSIS — E039 Hypothyroidism, unspecified: Secondary | ICD-10-CM | POA: Insufficient documentation

## 2016-08-03 DIAGNOSIS — I4891 Unspecified atrial fibrillation: Secondary | ICD-10-CM | POA: Insufficient documentation

## 2016-08-03 DIAGNOSIS — D692 Other nonthrombocytopenic purpura: Secondary | ICD-10-CM | POA: Insufficient documentation

## 2016-08-03 DIAGNOSIS — I10 Essential (primary) hypertension: Secondary | ICD-10-CM | POA: Insufficient documentation

## 2016-08-03 DIAGNOSIS — G3184 Mild cognitive impairment, so stated: Secondary | ICD-10-CM | POA: Insufficient documentation

## 2016-08-03 DIAGNOSIS — I739 Peripheral vascular disease, unspecified: Secondary | ICD-10-CM | POA: Insufficient documentation

## 2016-08-03 DIAGNOSIS — I712 Thoracic aortic aneurysm, without rupture, unspecified: Secondary | ICD-10-CM | POA: Insufficient documentation

## 2016-08-03 DIAGNOSIS — M48061 Spinal stenosis, lumbar region without neurogenic claudication: Secondary | ICD-10-CM | POA: Insufficient documentation

## 2016-08-03 DIAGNOSIS — E118 Type 2 diabetes mellitus with unspecified complications: Secondary | ICD-10-CM | POA: Insufficient documentation

## 2016-09-14 ENCOUNTER — Encounter: Payer: Self-pay | Admitting: Gastroenterology

## 2016-09-14 ENCOUNTER — Other Ambulatory Visit: Payer: Self-pay

## 2016-09-14 ENCOUNTER — Ambulatory Visit (INDEPENDENT_AMBULATORY_CARE_PROVIDER_SITE_OTHER): Payer: Medicare Other | Admitting: Gastroenterology

## 2016-09-14 VITALS — BP 103/49 | HR 72 | Ht 69.0 in | Wt 211.0 lb

## 2016-09-14 DIAGNOSIS — K269 Duodenal ulcer, unspecified as acute or chronic, without hemorrhage or perforation: Secondary | ICD-10-CM | POA: Diagnosis not present

## 2016-09-14 NOTE — Progress Notes (Signed)
Primary Care Physician: System, Pcp Not In  Primary Gastroenterologist:  Dr. [name]  Chief Complaint  Patient presents with  . Hospitalization Follow-up    HPI: Glenn Reeves is a 83 y.o. male here for follow-up after being in the hospital with a duodenal ulcer found. The patient has had no further black stools. The patient has been off of aspirin and anticoagulation. There is no report of any abdominal pain nausea vomiting fevers or chills.  Current Outpatient Prescriptions  Medication Sig Dispense Refill  . carvedilol (COREG) 6.25 MG tablet Take 6.25 mg by mouth 2 (two) times daily.    Marland Kitchen diltiazem (CARDIZEM) 30 MG tablet     . finasteride (PROSCAR) 5 MG tablet Take 5 mg by mouth daily.    . furosemide (LASIX) 20 MG tablet     . Glucose Blood (BAYER BREEZE 2 TEST) DISK TEST 3 TIMES DAILY BEFORE MEALS    . HUMALOG KWIKPEN 100 UNIT/ML KiwkPen Take 12 Units by mouth 3 (three) times daily.    Marland Kitchen ipratropium-albuterol (DUONEB) 0.5-2.5 (3) MG/3ML SOLN Inhale 3 mLs into the lungs 2 (two) times daily.     Marland Kitchen LEVEMIR FLEXTOUCH 100 UNIT/ML Pen     . levothyroxine (SYNTHROID, LEVOTHROID) 75 MCG tablet Take 75 mcg by mouth daily.    . Misc. Devices MISC CPAP/BiPAP Tubing and Supplies 1 unit  Quantity: 1;  Refills: 3    Vela Prose M.D.;  Started 31-October-2012 Active    . montelukast (SINGULAIR) 10 MG tablet Take 1 tablet by mouth daily.    . pantoprazole (PROTONIX) 40 MG tablet Take 1 tablet (40 mg total) by mouth 2 (two) times daily. 50 tablet 0  . polycarbophil (FIBERCON) 625 MG tablet Take 625 mg by mouth daily as needed.    Marland Kitchen Respiratory Therapy Supplies (NEBULIZER/TUBING/MOUTHPIECE) KIT Nebulizer/Tubing/Mouthpiece KIT USE AS DIRECTED.  Quantity: 1;  Refills: 0    Vela Prose M.D.;  Started 31-October-2012 Active    . rosuvastatin (CRESTOR) 20 MG tablet Take 10 mg by mouth daily.     . rosuvastatin (CRESTOR) 20 MG tablet Take by mouth.    . tamsulosin  (FLOMAX) 0.4 MG CAPS capsule     . amoxicillin-clavulanate (AUGMENTIN) 875-125 MG tablet     . aspirin EC 81 MG tablet Take 1 tablet by mouth daily.    Marland Kitchen ELIQUIS 5 MG TABS tablet     . feeding supplement, ENSURE ENLIVE, (ENSURE ENLIVE) LIQD Take 237 mLs by mouth 2 (two) times daily between meals. (Patient not taking: Reported on 09/14/2016) 237 mL 12  . glipiZIDE (GLUCOTROL XL) 10 MG 24 hr tablet Take 10 mg by mouth daily with breakfast.    . guaiFENesin (MUCINEX) 600 MG 12 hr tablet Take 2 tablets by mouth 2 (two) times daily.    . sucralfate (CARAFATE) 1 GM/10ML suspension Take 10 mLs (1 g total) by mouth 4 (four) times daily -  with meals and at bedtime. (Patient not taking: Reported on 09/14/2016) 420 mL 0   No current facility-administered medications for this visit.     Allergies as of 09/14/2016  . (No Known Allergies)    ROS:  General: Negative for anorexia, weight loss, fever, chills, fatigue, weakness. ENT: Negative for hoarseness, difficulty swallowing , nasal congestion. CV: Negative for chest pain, angina, palpitations, dyspnea on exertion, peripheral edema.  Respiratory: Negative for dyspnea at rest, dyspnea on exertion, cough, sputum, wheezing.  GI: See history of present illness. GU:  Negative for  dysuria, hematuria, urinary incontinence, urinary frequency, nocturnal urination.  Endo: Negative for unusual weight change.    Physical Examination:   BP (!) 103/49   Pulse 72   Ht '5\' 9"'  (1.753 m)   Wt 211 lb (95.7 kg)   BMI 31.16 kg/m   General: Well-nourished, well-developed in no acute distress.  Eyes: No icterus. Conjunctivae pink. Mouth: Oropharyngeal mucosa moist and pink , no lesions Neuro: Alert and oriented x 3.  Grossly intact. Skin: Warm and dry, no jaundice.   Psych: Alert and cooperative, normal mood and affect.  Labs:    Imaging Studies: No results found.  Assessment and Plan:   Glenn Reeves is a 83 y.o. y/o male who is in the hospital  with a duodenal ulcer. The patient had treatment with endoclips and the patient had no further bleeding. The patient's hemoglobin has been increasing slowly. The patient has been told that he can start iron and his 81 mg of aspirin. The patient will follow-up with me as needed.    [name], MD. [name]   Note: This dictation was prepared with Dragon dictation along with smaller phrase technology. Any transcriptional errors that result from this process are unintentional.

## 2017-12-26 ENCOUNTER — Emergency Department: Payer: Medicare Other

## 2017-12-26 ENCOUNTER — Encounter: Payer: Self-pay | Admitting: Emergency Medicine

## 2017-12-26 ENCOUNTER — Emergency Department
Admission: EM | Admit: 2017-12-26 | Discharge: 2017-12-26 | Disposition: A | Discharge status: Home / Self Care | Payer: Medicare Other | Attending: Emergency Medicine | Admitting: Emergency Medicine

## 2017-12-26 ENCOUNTER — Other Ambulatory Visit: Payer: Self-pay

## 2017-12-26 DIAGNOSIS — I2510 Atherosclerotic heart disease of native coronary artery without angina pectoris: Secondary | ICD-10-CM | POA: Insufficient documentation

## 2017-12-26 DIAGNOSIS — N183 Chronic kidney disease, stage 3 (moderate): Secondary | ICD-10-CM | POA: Diagnosis not present

## 2017-12-26 DIAGNOSIS — J449 Chronic obstructive pulmonary disease, unspecified: Secondary | ICD-10-CM | POA: Diagnosis not present

## 2017-12-26 DIAGNOSIS — Y92009 Unspecified place in unspecified non-institutional (private) residence as the place of occurrence of the external cause: Secondary | ICD-10-CM

## 2017-12-26 DIAGNOSIS — R531 Weakness: Secondary | ICD-10-CM | POA: Insufficient documentation

## 2017-12-26 DIAGNOSIS — W19XXXA Unspecified fall, initial encounter: Secondary | ICD-10-CM | POA: Diagnosis not present

## 2017-12-26 DIAGNOSIS — G3184 Mild cognitive impairment, so stated: Secondary | ICD-10-CM | POA: Diagnosis not present

## 2017-12-26 DIAGNOSIS — I129 Hypertensive chronic kidney disease with stage 1 through stage 4 chronic kidney disease, or unspecified chronic kidney disease: Secondary | ICD-10-CM | POA: Diagnosis not present

## 2017-12-26 DIAGNOSIS — Z7982 Long term (current) use of aspirin: Secondary | ICD-10-CM | POA: Insufficient documentation

## 2017-12-26 DIAGNOSIS — Z794 Long term (current) use of insulin: Secondary | ICD-10-CM | POA: Diagnosis not present

## 2017-12-26 DIAGNOSIS — Z79899 Other long term (current) drug therapy: Secondary | ICD-10-CM | POA: Insufficient documentation

## 2017-12-26 DIAGNOSIS — E1122 Type 2 diabetes mellitus with diabetic chronic kidney disease: Secondary | ICD-10-CM | POA: Diagnosis not present

## 2017-12-26 LAB — URINALYSIS, COMPLETE (UACMP) WITH MICROSCOPIC
BILIRUBIN URINE: NEGATIVE
Bacteria, UA: NONE SEEN
Glucose, UA: NEGATIVE mg/dL
KETONES UR: NEGATIVE mg/dL
LEUKOCYTES UA: NEGATIVE
Nitrite: NEGATIVE
PH: 5 (ref 5.0–8.0)
Protein, ur: NEGATIVE mg/dL
SPECIFIC GRAVITY, URINE: 1.009 (ref 1.005–1.030)

## 2017-12-26 LAB — CBC WITH DIFFERENTIAL/PLATELET
BASOS ABS: 0.1 10*3/uL (ref 0–0.1)
Basophils Relative: 1 %
Eosinophils Absolute: 0.4 10*3/uL (ref 0–0.7)
Eosinophils Relative: 4 %
HEMATOCRIT: 39.6 % — AB (ref 40.0–52.0)
HEMOGLOBIN: 13.2 g/dL (ref 13.0–18.0)
LYMPHS PCT: 24 %
Lymphs Abs: 2.6 10*3/uL (ref 1.0–3.6)
MCH: 29.4 pg (ref 26.0–34.0)
MCHC: 33.4 g/dL (ref 32.0–36.0)
MCV: 87.8 fL (ref 80.0–100.0)
Monocytes Absolute: 0.9 10*3/uL (ref 0.2–1.0)
Monocytes Relative: 9 %
NEUTROS PCT: 62 %
Neutro Abs: 7.1 10*3/uL — ABNORMAL HIGH (ref 1.4–6.5)
PLATELETS: 208 10*3/uL (ref 150–440)
RBC: 4.51 MIL/uL (ref 4.40–5.90)
RDW: 15.1 % — ABNORMAL HIGH (ref 11.5–14.5)
WBC: 11.1 10*3/uL — AB (ref 3.8–10.6)

## 2017-12-26 LAB — COMPREHENSIVE METABOLIC PANEL
ALK PHOS: 49 U/L (ref 38–126)
ALT: 14 U/L (ref 0–44)
ANION GAP: 7 (ref 5–15)
AST: 19 U/L (ref 15–41)
Albumin: 2.7 g/dL — ABNORMAL LOW (ref 3.5–5.0)
BILIRUBIN TOTAL: 0.5 mg/dL (ref 0.3–1.2)
BUN: 24 mg/dL — ABNORMAL HIGH (ref 8–23)
CALCIUM: 9.2 mg/dL (ref 8.9–10.3)
CO2: 30 mmol/L (ref 22–32)
Chloride: 103 mmol/L (ref 98–111)
Creatinine, Ser: 1.4 mg/dL — ABNORMAL HIGH (ref 0.61–1.24)
GFR, EST AFRICAN AMERICAN: 51 mL/min — AB (ref >60)
GFR, EST NON AFRICAN AMERICAN: 44 mL/min — AB (ref >60)
Glucose, Bld: 187 mg/dL — ABNORMAL HIGH (ref 70–99)
POTASSIUM: 4.3 mmol/L (ref 3.5–5.1)
Sodium: 140 mmol/L (ref 135–145)
Total Protein: 7 g/dL (ref 6.5–8.1)

## 2017-12-26 LAB — TROPONIN I

## 2017-12-26 MED ORDER — SODIUM CHLORIDE 0.9 % IV BOLUS
500.0000 mL | Freq: Once | INTRAVENOUS | Status: AC
  Administered 2017-12-26: 500 mL via INTRAVENOUS

## 2017-12-26 NOTE — ED Triage Notes (Addendum)
Pt to ED via EMS from home with c/o mechanical fall down car door step, denies any head injury or LOC. Skin tear noted to LFT wrist. PT denies pain, per family more confused than usual today. PT has dermatological issue going on to LFT forehead , not acute from fall. VSS

## 2017-12-26 NOTE — ED Provider Notes (Addendum)
[facility] Emergency Department Provider Note  ____________________________________________   I have reviewed the triage vital signs and the nursing notes. Where available I have reviewed prior notes and, if possible and indicated, outside hospital notes.    HISTORY  Chief Complaint Weakness and Fall    HPI Glenn Reeves is a 84 y.o. male  Who fell today. 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  Patient does have a left facial droop as a result of nerve damage from prior surgery, he also has a chronic ulceration there.  The patient states that he was feeling in his normal state of health he went to the car and lost his footing and fell.  History was somewhat limited, family did arrive after testing.  Patient states that he was not sure if he has had some CT scan was performed.  Patient daughter does arrive and states that she saw what happened he was getting into the car using his cane and he seemed to sag to the ground very slowly did not hit his head.  In any event, she seems to think that he was at his baseline prior to this event there was no antecedent weakness is reported by EMS at least according to this history.  Patient has a history of baseline mild confusion and family feel that he is near his baseline in that respect.  They would like him further evaluated.  They do state that the swelling around his left eye which is chronic seems to be slightly worse over the last couple days Is no complaints of injury or pain.  Level 5 chart caveat; no further history available due to patient status.  Past Medical History:  Diagnosis Date  . Atrial  fibrillation ([place])   . CAD (coronary artery disease)   . Diabetes mellitus without complication ([place])   . GI bleed   . Hypertension   . Thyroid disease     Patient Active Problem List   Diagnosis Date Noted  . Acquired hypothyroidism 08/03/2016  . Atrial fibrillation and flutter ([place]) 08/03/2016  . CKD (chronic kidney disease) stage 3, GFR 30-59 ml/min (HCC) 08/03/2016  . Controlled type 2 diabetes mellitus with complication, without long-term current use of insulin ([place]) 08/03/2016  . Essential hypertension 08/03/2016  . MCI (mild cognitive impairment) with memory loss 08/03/2016  . Peripheral vascular disease ([place]) 08/03/2016  . Senile purpura ([place]) 08/03/2016  . Spinal stenosis of lumbar region without neurogenic claudication 08/03/2016  . Thoracic aortic aneurysm without rupture ([place]) 08/03/2016  . Pulmonary fibrosis ([place]) 08/02/2016  . HCAP (healthcare-associated pneumonia)   . Generalized weakness   . Palliative care by specialist   . Goals of care, counseling/discussion   . Blood in stool   . Chronic duodenal ulcer with hemorrhage   . Gastritis without bleeding   . Sepsis ([place]) 06/26/2016  . Chronic obstructive pulmonary disease ([place]) 05/21/2016  . COPD (chronic obstructive pulmonary disease) ([place]) 02/28/2016  . OSA (obstructive sleep apnea) 02/28/2016    Past Surgical History:  Procedure Laterality Date  . ESOPHAGOGASTRODUODENOSCOPY (EGD) WITH PROPOFOL N/A 06/29/2016   Procedure: ESOPHAGOGASTRODUODENOSCOPY (EGD) WITH PROPOFOL;  Surgeon: [name], MD;  Location: [facility] ENDOSCOPY;  Service: Endoscopy;  Laterality: N/A;    Prior to Admission medications   Medication Sig Start Date End Date Taking? Authorizing Provider  aspirin EC 81 MG tablet Take 1 tablet by mouth daily.   Yes [provider]  carvedilol (COREG) 6.25 MG tablet Take 6.25 mg by mouth 2 (two) times daily. 06/13/16  Yes [provider]  finasteride (PROSCAR) 5 MG tablet Take 5 mg by mouth  daily. 06/07/16  Yes [provider]  furosemide (LASIX) 20 MG tablet Take 20 mg by mouth daily.    Yes [provider]  HUMALOG KWIKPEN 100 UNIT/ML KiwkPen Take 12 Units by mouth 3 (three) times daily with meals.    Yes [provider]  ipratropium-albuterol (DUONEB) 0.5-2.5 (3) MG/3ML SOLN Inhale 3 mLs into the lungs 2 (two) times daily.  05/25/16  Yes [provider]  LEVEMIR FLEXTOUCH 100 UNIT/ML Pen Inject 10-12 Units into the skin at bedtime.    Yes [provider]  levothyroxine (SYNTHROID, LEVOTHROID) 75 MCG tablet Take 75 mcg by mouth daily. 06/07/16  Yes [provider]  montelukast (SINGULAIR) 10 MG tablet Take 1 tablet by mouth daily. 06/07/16  Yes [provider]  pantoprazole (PROTONIX) 40 MG tablet Take 40 mg by mouth daily.   Yes [provider]  polycarbophil (FIBERCON) 625 MG tablet Take 625 mg by mouth daily.    Yes [provider]  rosuvastatin (CRESTOR) 10 MG tablet Take 10 mg by mouth daily.    Yes [provider]  tamsulosin (FLOMAX) 0.4 MG CAPS capsule Take 0.4 mg by mouth daily.    Yes [provider]    Allergies Patient has no known allergies.  Family History  Problem Relation Age of Onset  . Autoimmune disease Neg Hx     Social History Social History   Tobacco Use  . Smoking status: Never Smoker  . Smokeless tobacco: Never Used  Substance Use Topics  . Alcohol use: No  . Drug use: No    Review of Systems Constitutional: No fever/chills Eyes: No visual changes. ENT: No sore throat. No stiff neck no neck pain Cardiovascular: Denies chest pain. Respiratory: Denies shortness of breath. Gastrointestinal:   no vomiting.  No diarrhea.  No constipation. Genitourinary: Negative for dysuria. Musculoskeletal: Negative lower extremity swelling Skin: Negative for rash. Neurological: Negative for severe headaches, focal weakness or  numbness.   ____________________________________________   PHYSICAL EXAM:  VITAL SIGNS: ED Triage Vitals  Enc Vitals Group     BP 12/26/17 1717 (!) 151/87     Pulse Rate 12/26/17 1717 86     Resp 12/26/17 1717 16     Temp 12/26/17 1717 98 F (36.7 C)     Temp Source 12/26/17 1717 Axillary     SpO2 12/26/17 1717 99 %     Weight 12/26/17 1721 169 lb 1.5 oz (76.7 kg)     Height --      Head Circumference --      Peak Flow --      Pain Score --      Pain Loc --      Pain Edu? --      Excl. in [place]? --     Constitutional: Pale and elderly appearing gentleman in no acute distress, Eyes: Conjunctivae are normal Head: Atraumatic HEENT: No congestion/rhinnorhea. Mucous membranes are moist.  Oropharynx non-erythematous a chronic appearing skin erosion with drooping skin around it and some edema and mild redness  and swelling above the left eye which includes the left eyelid.   Neck:   Nontender with no meningismus, no masses, no stridor Cardiovascular: Normal rate, regular rhythm. Grossly normal heart sounds.  Good peripheral circulation. Respiratory: Normal respiratory effort.  No retractions. Lungs induced in the bases Abdominal: Soft and nontender. No distention. No guarding no rebound Back:  There is no focal tenderness or step off.  there is no midline tenderness there are no lesions noted. there is no CVA tenderness Musculoskeletal: No lower extremity tenderness, no upper extremity tenderness. No joint effusions, no DVT signs strong distal pulses no edema Neurologic:  Normal speech and language. No gross focal neurologic deficits are appreciated.  Skin:  Skin is warm, dry see above, there is also a very slight skin tear more of a scratch that anything on the left wrist with no bony tenderness Psychiatric: Mood and affect are normal. Speech and behavior are normal.  ____________________________________________   LABS (all labs ordered are listed, but only abnormal results are  displayed)  Labs Reviewed  CBC WITH DIFFERENTIAL/PLATELET - Abnormal; Notable for the following components:      Result Value   WBC 11.1 (*)    HCT 39.6 (*)    RDW 15.1 (*)    Neutro Abs 7.1 (*)    All other components within normal limits  COMPREHENSIVE METABOLIC PANEL - Abnormal; Notable for the following components:   Glucose, Bld 187 (*)    BUN 24 (*)    Creatinine, Ser 1.40 (*)    Albumin 2.7 (*)    GFR calc non Af Amer 44 (*)    GFR calc Af Amer 51 (*)    All other components within normal limits  TROPONIN I  URINALYSIS, COMPLETE (UACMP) WITH MICROSCOPIC  CBG MONITORING, ED    Pertinent labs  results that were available during my care of the patient were reviewed by me and considered in my medical decision making (see chart for details). ____________________________________________  EKG  I personally interpreted any EKGs ordered by me or triage EKG shows sinus rhythm, rate 85 bpm, RBBB and LAFB noted.  No acute ST elevation or depression ____________________________________________  RADIOLOGY  Pertinent labs & imaging results that were available during my care of the patient were reviewed by me and considered in my medical decision making (see chart for details). If possible, patient and/or family made aware of any abnormal findings.  Dg Chest 2 View  Result Date: 12/26/2017 CLINICAL DATA:  84 year old who fell earlier today while entering or exiting a car. Initial encounter. EXAM: CHEST - 2 VIEW COMPARISON:  07/03/2016, 07/01/2016 and earlier, including CT chest 06/29/2016. FINDINGS: AP ERECT and LATERAL images were obtained. Severe diffuse interstitial pulmonary fibrosis throughout both lungs with associated low lung volumes, unchanged when compared to the prior CT. No new pulmonary parenchymal abnormalities. Stable chronic elevation of the RIGHT hemidiaphragm. Cardiac silhouette normal in size, unchanged. Thoracic aorta atherosclerotic, unchanged. Hilar and mediastinal  contours otherwise unremarkable. Degenerative changes and DISH involving the thoracic spine. Degenerative changes involving the AC joints. IMPRESSION: 1. No acute cardiopulmonary disease. 2. Severe changes of interstitial pulmonary fibrosis throughout both lungs, unchanged when compared to a prior CT chest from March, 2018. Electronically Signed   By: Evangeline Dakin M.D.   On: 12/26/2017 18:32   Ct Head Wo Contrast  Result Date: 12/26/2017 CLINICAL DATA:  Fall with head injury and altered mental status. EXAM: CT HEAD WITHOUT CONTRAST CT MAXILLOFACIAL WITHOUT CONTRAST TECHNIQUE: Multidetector CT  imaging of the head and maxillofacial structures were performed using the standard protocol without intravenous contrast. Multiplanar CT image reconstructions of the maxillofacial structures were also generated. COMPARISON:  Head CT 06/26/2016 FINDINGS: CT HEAD FINDINGS Brain: There is no hemorrhage or intracranial mass effect. The size and configuration of the ventricles and extra-axial CSF spaces are normal. There is no acute or chronic infarction. There is hypoattenuation of the periventricular white matter, most commonly indicating chronic ischemic microangiopathy. Vascular: No hyperdense vessel or unexpected vascular calcification. Skull: There is extensive erosion of the left frontal calvarium, extending through the left frontal sinus and left orbital roof and lateral wall. The mass measures up to 8.8 x 3.5 cm. There is inward bulging of the left frontal convexity dura, but no intracranial extension is evident. CT MAXILLOFACIAL FINDINGS Osseous: No traumatic facial fractures identified. There is extensive erosion of the left anterior calvarium that extends through the left frontal sinus, anterior left lamina papyracea, left orbital roof, left orbital lateral wall and left maxillary process. Orbits: Despite the severe erosion of the roof and lateral wall of the left orbit, the globes and intraconal structures are  normal. The left lacrimal gland is difficult to separate from the mass. Extraocular muscles are intact. Normal optic nerves. Sinuses: There is severe rotation of the lateral aspect of the left frontal sinus. Otherwise, the paranasal sinuses and mastoid air cells are clear. Soft tissues: As above, large soft tissue component of mass at the left frontal scalp, which measures up to 8.8 x 3.5 cm. At the superior aspect of the mass, there is a central low attenuation collection that measures approximately 2.5 cm. IMPRESSION: 1. No acute traumatic intracranial abnormality or maxillofacial fracture. 2. Large left frontal scalp and calvarium mass measuring up to 8.8 x 3.5 cm with extensive erosion of the left frontal calvarium, left frontal sinus, left maxillary process and the superior and lateral walls of the left orbit. The primary consideration is an invasive skin carcinoma. Other possibilities include orbital lymphoma (though this is unusually aggressive), a primary osseous tumor or metastasis from an unknown primary cancer; but these are all less likely. 3. Central hypoattenuation within the mass is most likely necrotic tumor. However, given the proximity of the dura, this could represent a contained CSF leak. There is inward bulging of the left frontal convexity dura, but no evidence of intradural extension of tumor. Electronically Signed   By: Ulyses Jarred M.D.   On: 12/26/2017 18:33   Ct Maxillofacial Wo Contrast  Result Date: 12/26/2017 CLINICAL DATA:  Fall with head injury and altered mental status. EXAM: CT HEAD WITHOUT CONTRAST CT MAXILLOFACIAL WITHOUT CONTRAST TECHNIQUE: Multidetector CT imaging of the head and maxillofacial structures were performed using the standard protocol without intravenous contrast. Multiplanar CT image reconstructions of the maxillofacial structures were also generated. COMPARISON:  Head CT 06/26/2016 FINDINGS: CT HEAD FINDINGS Brain: There is no hemorrhage or intracranial mass  effect. The size and configuration of the ventricles and extra-axial CSF spaces are normal. There is no acute or chronic infarction. There is hypoattenuation of the periventricular white matter, most commonly indicating chronic ischemic microangiopathy. Vascular: No hyperdense vessel or unexpected vascular calcification. Skull: There is extensive erosion of the left frontal calvarium, extending through the left frontal sinus and left orbital roof and lateral wall. The mass measures up to 8.8 x 3.5 cm. There is inward bulging of the left frontal convexity dura, but no intracranial extension is evident. CT MAXILLOFACIAL FINDINGS Osseous: No traumatic facial fractures  identified. There is extensive erosion of the left anterior calvarium that extends through the left frontal sinus, anterior left lamina papyracea, left orbital roof, left orbital lateral wall and left maxillary process. Orbits: Despite the severe erosion of the roof and lateral wall of the left orbit, the globes and intraconal structures are normal. The left lacrimal gland is difficult to separate from the mass. Extraocular muscles are intact. Normal optic nerves. Sinuses: There is severe rotation of the lateral aspect of the left frontal sinus. Otherwise, the paranasal sinuses and mastoid air cells are clear. Soft tissues: As above, large soft tissue component of mass at the left frontal scalp, which measures up to 8.8 x 3.5 cm. At the superior aspect of the mass, there is a central low attenuation collection that measures approximately 2.5 cm. IMPRESSION: 1. No acute traumatic intracranial abnormality or maxillofacial fracture. 2. Large left frontal scalp and calvarium mass measuring up to 8.8 x 3.5 cm with extensive erosion of the left frontal calvarium, left frontal sinus, left maxillary process and the superior and lateral walls of the left orbit. The primary consideration is an invasive skin carcinoma. Other possibilities include orbital lymphoma  (though this is unusually aggressive), a primary osseous tumor or metastasis from an unknown primary cancer; but these are all less likely. 3. Central hypoattenuation within the mass is most likely necrotic tumor. However, given the proximity of the dura, this could represent a contained CSF leak. There is inward bulging of the left frontal convexity dura, but no evidence of intradural extension of tumor. Electronically Signed   By: Ulyses Jarred M.D.   On: 12/26/2017 18:33   ____________________________________________    PROCEDURES  Procedure(s) performed: None  Procedures  Critical Care performed: None  ____________________________________________   INITIAL IMPRESSION / ASSESSMENT AND PLAN / ED COURSE  Pertinent labs & imaging results that were available during my care of the patient were reviewed by me and considered in my medical decision making (see chart for details).  Patient here with a non-syncopal fall it appears.  Sounds like he gradually sagged to the ground after losing his footing.  No evidence of trauma to hips or other areas.  Blood work is reassuring, I did do a CT scan of his face as I do not know exactly what the baseline appearance of this chronic lesion is, CT scan is noted to show significant erosion of bone underneath which is certainly not something happened today.  There is documented refusal of patient and family endorses this for any surgical intervention on this lesion therefore, I do not think likely a bone reconstructive surgery is certainly something he would agree to we will talk to him about it.  We are checking urine I am giving him a fluid as a precaution, creatinine is slightly elevated BUN is slightly elevated may be a little bit dehydrated today cardiac enzymes EKG do not show any acute ischemia.  Significantly anemia, does have a history of GI bleed but not today apparently, any function is actually better than baseline.  We will talk extensively to family  about all the findings on CT which are significant, and we will see what else needs to be done.  It sounds from I can tell preliminarily that the patient goal is to be comfortable.  I do not see any acute indication for admission to the hospital at this time  ----------------------------------------- 7:12 PM on 12/26/2017 -----------------------------------------  Patient remains at baseline, family adamant they do not wish him to  go into the hospital.  They will start the hospice conversation with their primary care doctor.  His son is a Stage manager and he and I reviewed all of the CT findings together and the x-ray findings.  They are aware of the significant morbidity of associated with this pathology.  They do not wish antibiotics or any further intervention at this time there is no indication that there is an active infection.  They do understand that this will likely cause his death if nothing else does sooner, and they are very comfortable with it.  They are changing his CODE STATUS to DNR/DNI with his doctor in the next few days, and they wish to take him home and enjoy the time that he has left.  This is congruent with the patient's own believes.  The patient was offered a surgery for this, he refused any still does not wish any further intervention.  Given that there is nothing I can do to correct this we will discharge him with close outpatient follow-up.  Patient and family very comfortable with this plan.    ____________________________________________   FINAL CLINICAL IMPRESSION(S) / ED DIAGNOSES  Final diagnoses:  None      This chart was dictated using voice recognition software.  Despite best efforts to proofread,  errors can occur which can change meaning.      Schuyler Amor, MD 12/26/17 Valerie Roys    Schuyler Amor, MD 12/26/17 [phone]

## 2017-12-26 NOTE — Discharge Instructions (Addendum)
[name] cancer has gotten to be very significant, it has eroded into the bones around the brain.  As we discussed, you wish no further intervention for this.  You will be following closely with Dr. Caryl Comes to discuss hospice care and as we discussed changing his CODE STATUS to DNR/DNI would be it appears in best concordance with his wishes.  Please return to the emergency room for any concerns that arise.  Advise him to use his cane at all times, and follow closely with primary care doctor as discussed

## 2017-12-26 NOTE — ED Notes (Signed)
This tech assisted pt with urinal at bedside.

## 2018-01-02 ENCOUNTER — Encounter: Payer: Self-pay | Admitting: Oncology

## 2018-01-02 ENCOUNTER — Inpatient Hospital Stay: Payer: Medicare Other | Attending: Oncology | Admitting: Oncology

## 2018-01-02 ENCOUNTER — Other Ambulatory Visit: Payer: Self-pay

## 2018-01-02 VITALS — BP 111/63 | HR 67 | Temp 96.4°F | Resp 18 | Ht 69.0 in | Wt 166.0 lb

## 2018-01-02 DIAGNOSIS — J8410 Pulmonary fibrosis, unspecified: Secondary | ICD-10-CM | POA: Insufficient documentation

## 2018-01-02 DIAGNOSIS — R5383 Other fatigue: Secondary | ICD-10-CM | POA: Insufficient documentation

## 2018-01-02 DIAGNOSIS — C44329 Squamous cell carcinoma of skin of other parts of face: Secondary | ICD-10-CM | POA: Insufficient documentation

## 2018-01-02 DIAGNOSIS — I2510 Atherosclerotic heart disease of native coronary artery without angina pectoris: Secondary | ICD-10-CM | POA: Diagnosis not present

## 2018-01-02 DIAGNOSIS — R531 Weakness: Secondary | ICD-10-CM

## 2018-01-02 DIAGNOSIS — R634 Abnormal weight loss: Secondary | ICD-10-CM | POA: Diagnosis not present

## 2018-01-02 DIAGNOSIS — E119 Type 2 diabetes mellitus without complications: Secondary | ICD-10-CM | POA: Diagnosis not present

## 2018-01-02 DIAGNOSIS — Z79899 Other long term (current) drug therapy: Secondary | ICD-10-CM | POA: Insufficient documentation

## 2018-01-02 DIAGNOSIS — I4891 Unspecified atrial fibrillation: Secondary | ICD-10-CM

## 2018-01-02 DIAGNOSIS — E079 Disorder of thyroid, unspecified: Secondary | ICD-10-CM | POA: Insufficient documentation

## 2018-01-02 DIAGNOSIS — Z87891 Personal history of nicotine dependence: Secondary | ICD-10-CM | POA: Diagnosis not present

## 2018-01-02 DIAGNOSIS — C4492 Squamous cell carcinoma of skin, unspecified: Secondary | ICD-10-CM

## 2018-01-02 DIAGNOSIS — Z7189 Other specified counseling: Secondary | ICD-10-CM

## 2018-01-02 DIAGNOSIS — I10 Essential (primary) hypertension: Secondary | ICD-10-CM | POA: Diagnosis not present

## 2018-01-02 NOTE — Progress Notes (Signed)
Patient here for initial visit. °

## 2018-01-02 NOTE — Progress Notes (Signed)
Hematology/Oncology Consult note [facility] [phone] [phone]   Patient Care Team: Adin Hector, MD as PCP - General (Internal Medicine)  REFERRING PROVIDER: Adin Hector, MD CHIEF COMPLAINTS/REASON FOR VISIT:  Evaluation of squamous cell carcinoma of the skin  HISTORY OF PRESENTING ILLNESS:  Glenn Reeves is a  84 y.o.  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  He had multiple skin resection procedures for SCC removal of left ear, multiple area on face, left scalp and left forehead.  I do not have his previous medical records from [place]. Reviewed patient's medical records in [facility] via careeverywhere.  He previously underwent resection of squamous cell carcinoma of left forehead in [place], deep margin of the left temporal wound was reported positive.  He was seen and evaluated by Dr.Puscas in [facility] in January 2019. Treatment options ie Mohs vs wide local excision were discussed at that time and decision was made to proceed with wide local resection of left temple SCC and wedge resection of left lowe lip SCC. However his surgery was put off due to weather and ultimately patient elected not to follow through with it.   Recently he presented to ER for evaluation after a fall. CT head and maxillofacial was done which revealed no acute traumatic abnormaltiy however showed large left frontal scalp and calvarium mass measuring up to 8.8cm x 3.5cm with extension erosion of the left frontal calcarium, left frontal sinus, left maxillary process, and superior and lateral walled of the left orbit.   Patient also has lost 10 pounds weight, feels very weak. Accompanied by his daughter who he lives with.  Chronic history of lung fibrosis, HTN, DM, CAD, Atrial fibrillation, hearing impairment.   Data Reviewed 04/10/2018 CT neck  soft tissue w contrast No metastatic disease or lymphadenopathy in the parotid glands. Pathology consultation at [facility] # A. Outside case, [id number], [facility] [place], [place]. Date of procedure 01-31-17: 1. Left central forehead, shave removal and destruction: SQUAMOUS CELL CARCINOMA, INVASIVE, BIOPSY. 2. Left central forehead, shave removal and destruction: SQUAMOUS CELL CARCINOMA, INVASIVE, BIOPSY. SEE NOTE. NOTE: Sections show proliferation of atypical keratinocytes infiltrating in the dermis. Per outside report the atypical cells are positive for AE1/AE3 and P63. S100 and Melan A stains are non contributory. 3. Left inferior vermilion lip, shave removal and destruction: SQUAMOUS CELL CARCINOMA, INVASIVE, BIOPSY.    Review of Systems  Constitutional: Positive for malaise/fatigue and weight loss. Negative for chills and fever.  HENT: Positive for hearing loss.   Eyes: Negative for redness.       Impaired left eye vision.  Respiratory: Negative for cough.   Cardiovascular: Negative for chest pain.  Gastrointestinal: Negative for abdominal pain and vomiting.  Genitourinary: Negative for dysuria.  Musculoskeletal: Negative for myalgias.  Skin:       Chronic left termproal/left eye skin lesion.   Neurological: Negative for headaches.  Psychiatric/Behavioral: Negative for hallucinations.    MEDICAL HISTORY:  Past Medical History:  Diagnosis Date  . Atrial fibrillation ([place])   . CAD (coronary artery disease)   . Cancer (HCC)    squamous cell - left ear  . Diabetes mellitus without complication ([place])   . GI bleed   . Hypertension   . Pulmonary fibrosis ([place])   . Thyroid disease     SURGICAL HISTORY: Past Surgical  History:  Procedure Laterality Date  . ESOPHAGOGASTRODUODENOSCOPY (EGD) WITH PROPOFOL N/A 06/29/2016   Procedure: ESOPHAGOGASTRODUODENOSCOPY (EGD) WITH PROPOFOL;  Surgeon: [name], MD;  Location: [facility] ENDOSCOPY;  Service: Endoscopy;   Laterality: N/A;    SOCIAL HISTORY: Social History   Socioeconomic History  . Marital status: Widowed    Spouse name: Not on file  . Number of children: Not on file  . Years of education: Not on file  . Highest education level: Not on file  Occupational History  . Not on file  Social Needs  . Financial resource strain: Not on file  . Food insecurity:    Worry: Not on file    Inability: Not on file  . Transportation needs:    Medical: Not on file    Non-medical: Not on file  Tobacco Use  . Smoking status: Former Smoker    Types: Cigarettes    Last attempt to quit: 01/03/1988    Years since quitting: 30.0  . Smokeless tobacco: Never Used  . Tobacco comment: smoked in the winter  Substance and Sexual Activity  . Alcohol use: No  . Drug use: No  . Sexual activity: Not on file  Lifestyle  . Physical activity:    Days per week: Not on file    Minutes per session: Not on file  . Stress: Not on file  Relationships  . Social connections:    Talks on phone: Not on file    Gets together: Not on file    Attends religious service: Not on file    Active member of club or organization: Not on file    Attends meetings of clubs or organizations: Not on file    Relationship status: Not on file  . Intimate partner violence:    Fear of current or ex partner: Not on file    Emotionally abused: Not on file    Physically abused: Not on file    Forced sexual activity: Not on file  Other Topics Concern  . Not on file  Social History Narrative  . Not on file    FAMILY HISTORY: Family History  Problem Relation Age of Onset  . Heart attack Mother   . Heart attack Father   . Heart disease Brother   . Stroke Brother   . Autoimmune disease Neg Hx     ALLERGIES:  is allergic to codeine.  MEDICATIONS:  Current Outpatient Medications  Medication Sig Dispense Refill  . carvedilol (COREG) 6.25 MG tablet Take 6.25 mg by mouth 2 (two) times daily.    . finasteride (PROSCAR) 5 MG  tablet Take 5 mg by mouth daily.    Marland Kitchen ipratropium-albuterol (DUONEB) 0.5-2.5 (3) MG/3ML SOLN Inhale 3 mLs into the lungs every evening.     Marland Kitchen LEVEMIR FLEXTOUCH 100 UNIT/ML Pen Inject 5 Units into the skin at bedtime.     Marland Kitchen levothyroxine (SYNTHROID, LEVOTHROID) 75 MCG tablet Take 75 mcg by mouth daily.    . montelukast (SINGULAIR) 10 MG tablet Take 1 tablet by mouth daily.    . pantoprazole (PROTONIX) 40 MG tablet Take 40 mg by mouth daily.    . polycarbophil (FIBERCON) 625 MG tablet Take 625 mg by mouth daily.     . rosuvastatin (CRESTOR) 20 MG tablet Take 10 mg by mouth every evening.     . tamsulosin (FLOMAX) 0.4 MG CAPS capsule Take 0.4 mg by mouth daily.     . furosemide (LASIX) 20 MG tablet Take 20 mg by mouth  every other day.     Marland Kitchen HUMALOG KWIKPEN 100 UNIT/ML KiwkPen Inject 12 Units into the skin See admin instructions. Inject 12u under the skin with meals as directed for blood sugar over 150     No current facility-administered medications for this visit.      PHYSICAL EXAMINATION: ECOG PERFORMANCE STATUS: 3 - Symptomatic, >50% confined to bed Vitals:   01/02/18 1058  BP: 111/63  Pulse: 67  Resp: 18  Temp: (!) 96.4 F (35.8 C)   Filed Weights   01/02/18 1058  Weight: 166 lb (75.3 kg)    Physical Exam  Constitutional: He is oriented to person, place, and time. No distress.  HENT:  Post surgical changes of left ear.  left forehead and temporal scalp open wound.   Eyes: No scleral icterus.  periorbital edema with ptosis of the eye on the left.  Neck: Normal range of motion. Neck supple.  Cardiovascular: Normal rate, regular rhythm and normal heart sounds.  Pulmonary/Chest: Effort normal. No respiratory distress. He has no wheezes.  Abdominal: Soft. Bowel sounds are normal. He exhibits no distension and no mass. There is no tenderness.  Musculoskeletal: Normal range of motion. He exhibits no edema or deformity.  Neurological: He is alert and oriented to person, place,  and time. No cranial nerve deficit. Coordination normal.  Skin: Skin is warm and dry. No rash noted. No erythema.  Psychiatric: He has a normal mood and affect. His behavior is normal. Thought content normal.       LABORATORY DATA:  I have reviewed the data as listed Lab Results  Component Value Date   WBC 11.1 (H) 12/26/2017   HGB 13.2 12/26/2017   HCT 39.6 (L) 12/26/2017   MCV 87.8 12/26/2017   PLT 208 12/26/2017   Recent Labs    12/26/17 1732  NA 140  K 4.3  CL 103  CO2 30  GLUCOSE 187*  BUN 24*  CREATININE 1.40*  CALCIUM 9.2  GFRNONAA 44*  GFRAA 51*  PROT 7.0  ALBUMIN 2.7*  AST 19  ALT 14  ALKPHOS 49  BILITOT 0.5   Iron/TIBC/Ferritin/ %Sat No results found for: IRON, TIBC, FERRITIN, IRONPCTSAT   RADIOGRAPHIC STUDIES: I have personally reviewed the radiological images as listed and agreed with the findings in the report. 12/26/2017 CT head wo contrast and CT maxillofacial wo contrast.  1. No acute traumatic intracranial abnormality or maxillofacial fracture. 2. Large left frontal scalp and calvarium mass measuring up to 8.8 x 3.5 cm with extensive erosion of the left frontal calvarium, left frontal sinus, left maxillary process and the superior and lateral walls of the left orbit. The primary consideration is an invasive skin carcinoma. Other possibilities include orbital lymphoma (though this is unusually aggressive), a primary osseous tumor or metastasis from an unknown primary cancer; but these are all less likely. 3. Central hypoattenuation within the mass is most likely necrotic tumor. However, given the proximity of the dura, this could represent a contained CSF leak. There is inward bulging of the left frontal convexity dura, but no evidence of intradural extension of tumor.  ASSESSMENT & PLAN:  1. Squamous cell carcinoma of skin   2. Weight loss   3. Other fatigue   4. Goals of care, counseling/discussion    Patient previously declined surgery. I  recommend patient to be evaluated by Radiation Oncology for palliative RT.  He also needs wound care. Will refer to wound care team for further evaluation.  Will present case to  tumor board.   Goal of care, discussed with patient and daughter that distant metastasis can happen for cutaneous squamous cell carcinoma, finishing staging work up can be considered. He has constitutional symptoms including Weight loss/fatigue  If confirmed distant metastasis,  given his advanced age, multiple medical conditions, poor performance status,  He is not a candidate for chemotherapy. Systemic immunotherapy, ie cemiplimab can be considered. Patient and his daughter not interested in systemic treatment. They are interested in palliative radiation. Patient and family know to call clinic if they have questions or concerns.   Case was presented on tumor board on 01/03/2018. Consensus was to do palliative RT.   Orders Placed This Encounter  Procedures  . Ambulatory referral to Radiation Oncology    Referral Priority:   Routine    Referral Type:   Consultation    Referral Reason:   Specialty Services Required    Referred to Provider:   Noreene Filbert, MD    Requested Specialty:   Radiation Oncology    Number of Visits Requested:   1  . Ambulatory referral to Wound Clinic    Referral Priority:   Routine    Referral Type:   Consultation    Referral Reason:   Specialty Services Required    Requested Specialty:   Wound Care    Number of Visits Requested:   1    All questions were answered. The patient knows to call the clinic with any problems questions or concerns.  Return of visit: as needed.  Thank you for this kind referral and the opportunity to participate in the care of this patient. A copy of today's note is routed to referring provider  Total face to face encounter time for this patient visit was 60 min. >50% of the time was  spent in counseling and coordination of care.    Earlie Server, MD, PhD Hematology  Oncology [facility] Pager- [phone] 01/02/2018

## 2018-01-07 ENCOUNTER — Encounter: Payer: Self-pay | Admitting: Radiation Oncology

## 2018-01-07 ENCOUNTER — Other Ambulatory Visit: Payer: Self-pay

## 2018-01-07 ENCOUNTER — Other Ambulatory Visit: Payer: Self-pay | Admitting: *Deleted

## 2018-01-07 ENCOUNTER — Ambulatory Visit
Admission: RE | Admit: 2018-01-07 | Discharge: 2018-01-07 | Disposition: A | Discharge status: Home / Self Care | Payer: Medicare Other | Source: Ambulatory Visit | Attending: Radiation Oncology | Admitting: Radiation Oncology

## 2018-01-07 VITALS — BP 96/50 | HR 73 | Resp 20 | Wt 166.2 lb

## 2018-01-07 DIAGNOSIS — E079 Disorder of thyroid, unspecified: Secondary | ICD-10-CM | POA: Diagnosis not present

## 2018-01-07 DIAGNOSIS — Z8719 Personal history of other diseases of the digestive system: Secondary | ICD-10-CM | POA: Diagnosis not present

## 2018-01-07 DIAGNOSIS — Z87891 Personal history of nicotine dependence: Secondary | ICD-10-CM | POA: Insufficient documentation

## 2018-01-07 DIAGNOSIS — C4492 Squamous cell carcinoma of skin, unspecified: Secondary | ICD-10-CM

## 2018-01-07 DIAGNOSIS — Z79899 Other long term (current) drug therapy: Secondary | ICD-10-CM | POA: Insufficient documentation

## 2018-01-07 DIAGNOSIS — I4891 Unspecified atrial fibrillation: Secondary | ICD-10-CM | POA: Insufficient documentation

## 2018-01-07 DIAGNOSIS — J8410 Pulmonary fibrosis, unspecified: Secondary | ICD-10-CM | POA: Diagnosis not present

## 2018-01-07 DIAGNOSIS — E119 Type 2 diabetes mellitus without complications: Secondary | ICD-10-CM | POA: Diagnosis not present

## 2018-01-07 DIAGNOSIS — C4442 Squamous cell carcinoma of skin of scalp and neck: Secondary | ICD-10-CM | POA: Diagnosis not present

## 2018-01-07 DIAGNOSIS — I2510 Atherosclerotic heart disease of native coronary artery without angina pectoris: Secondary | ICD-10-CM | POA: Insufficient documentation

## 2018-01-07 NOTE — Consult Note (Signed)
NEW PATIENT EVALUATION  Name: Glenn Reeves  MRN: [id number]  Date:   01/07/2018     DOB: [birth year]   This 84 y.o. 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  REFERRING PHYSICIAN: Adin Hector, MD  CHIEF COMPLAINT:  Chief Complaint  Patient presents with  . Cancer    Pt is here for initial consultation    DIAGNOSIS: The encounter diagnosis was Squamous cell carcinoma of skin.   PREVIOUS INVESTIGATIONS:  Clinical notes reviewed CT scan reviewed PET CT scan ordered Pathology reports from [facility] reviewed  HPI: patient is a 84 year old male with history of previous previous Mohs resection for squamous cell carcinoma of the left periorbital region with resections of multiple areas including face left scalp and left for head. He had the surgery done in [place] with initial deep margin of the left temporal room was reported positive. He's been seen by  Dr.Puscas in [facility] ENT in January 2019. At that time further surgery was recommended however patient inclined that.no further treatment was done and no referral to radiation oncology was made at that time. He was seen in the emergency room for evaluation of a fall with CT head and maxillary facial images showed a large left frontal scalp and calvarium mass measuring up to 8.8 x 3.5 cm with extension erosion of the left frontal L carry him left frontal sinus left maxillary process superior lateral wall of the left orbit. He has an open wound with a bandage on it. Family has declined chemotherapy there now referred to radiation oncology for consideration of palliative treatment. Patient is fairly noncommunicative and most discussions with his daughter. Patient has significant comorbidities including lung fibrosis diabetes mellitus coronary artery disease atrial fibrillation.his pathology from [facility] show squamous cell carcinoma as  described above.he is now referred to radiation oncology for opinion.  PLANNED TREATMENT REGIMEN: palliative radiation therapy  PAST MEDICAL HISTORY:  has a past medical history of Atrial fibrillation ([place]), CAD (coronary artery disease), Cancer ([place]), Diabetes mellitus without complication ([place]), GI bleed, Hypertension, Pulmonary fibrosis ([place]), and Thyroid disease.    PAST SURGICAL HISTORY:  Past Surgical History:  Procedure Laterality Date  . ESOPHAGOGASTRODUODENOSCOPY (EGD) WITH PROPOFOL N/A 06/29/2016   Procedure: ESOPHAGOGASTRODUODENOSCOPY (EGD) WITH PROPOFOL;  Surgeon: [name], MD;  Location: [facility] ENDOSCOPY;  Service: Endoscopy;  Laterality: N/A;    FAMILY HISTORY: family history includes Heart attack in his father and mother; Heart disease in his brother; Stroke in his brother.  SOCIAL HISTORY:  reports that he quit smoking about 30 years ago. His smoking use included cigarettes. He has never used smokeless tobacco. He reports that he does not drink alcohol or use drugs.  ALLERGIES: Codeine  MEDICATIONS:  Current Outpatient Medications  Medication Sig Dispense Refill  . carvedilol (COREG) 6.25 MG tablet Take 6.25 mg by mouth 2 (two) times daily.    . finasteride (PROSCAR) 5 MG tablet Take 5 mg by mouth daily.    . furosemide (LASIX) 20 MG tablet Take 20 mg by mouth every other day.     Marland Kitchen HUMALOG KWIKPEN 100 UNIT/ML KiwkPen Inject 12 Units into the skin See admin instructions. Inject 12u under the skin with meals as directed for blood sugar over 150    . ipratropium-albuterol (DUONEB) 0.5-2.5 (3) MG/3ML SOLN Inhale 3 mLs into the lungs every evening.     Marland Kitchen LEVEMIR FLEXTOUCH 100 UNIT/ML Pen Inject 5  Units into the skin at bedtime.     Marland Kitchen levothyroxine (SYNTHROID, LEVOTHROID) 75 MCG tablet Take 75 mcg by mouth daily.    . montelukast (SINGULAIR) 10 MG tablet Take 1 tablet by mouth daily.    . pantoprazole (PROTONIX) 40 MG tablet Take 40 mg by mouth daily.    . polycarbophil  (FIBERCON) 625 MG tablet Take 625 mg by mouth daily.     . rosuvastatin (CRESTOR) 20 MG tablet Take 10 mg by mouth every evening.     . tamsulosin (FLOMAX) 0.4 MG CAPS capsule Take 0.4 mg by mouth daily.      No current facility-administered medications for this encounter.     ECOG PERFORMANCE STATUS:  2 - Symptomatic, <50% confined to bed  REVIEW OF SYSTEMS: patient is a poor historian most information given by his daughter except the large open wound on his head  Patient denies any weight loss, fatigue, weakness, fever, chills or night sweats. Patient denies any loss of vision, blurred vision. Patient denies any ringing  of the ears or hearing loss. No irregular heartbeat. Patient denies heart murmur or history of fainting. Patient denies any chest pain or pain radiating to her upper extremities. Patient denies any shortness of breath, difficulty breathing at night, cough or hemoptysis. Patient denies any swelling in the lower legs. Patient denies any nausea vomiting, vomiting of blood, or coffee ground material in the vomitus. Patient denies any stomach pain. Patient states has had normal bowel movements no significant constipation or diarrhea. Patient denies any dysuria, hematuria or significant nocturia. Patient denies any problems walking, swelling in the joints or loss of balance. Patient denies any skin changes, loss of hair or loss of weight. Patient denies any excessive worrying or anxiety or significant depression. Patient denies any problems with insomnia. Patient denies excessive thirst, polyuria, polydipsia. Patient denies any swollen glands, patient denies easy bruising or easy bleeding. Patient denies any recent infections, allergies or URI. Patient "s visual fields have not changed significantly in recent time.   PHYSICAL EXAM: BP (!) 96/50 (BP Location: Right Arm, Patient Position: Sitting)   Pulse 73   Resp 20   Wt 166 lb 3.6 oz (75.4 kg)   BMI 24.55 kg/m  Patient is a large  ulcerated lesion in the left skull. No evidence of cervical supra clavicular adenopathy bilaterally is noted. He does seem to have some limited motion of his left orbit.Well-developed well-nourished patient in NAD. HEENT reveals PERLA, EOMI, discs not visualized.  Oral cavity is clear. No oral mucosal lesions are identified. Neck is clear without evidence of cervical or supraclavicular adenopathy. Lungs are clear to A&P. Cardiac examination is essentially unremarkable with regular rate and rhythm without murmur rub or thrill. Abdomen is benign with no organomegaly or masses noted. Motor sensory and DTR levels are equal and symmetric in the upper and lower extremities. Cranial nerves II through XII are grossly intact. Proprioception is intact. No peripheral adenopathy or edema is identified. No motor or sensory levels are noted. Crude visual fields are within normal range.  LABORATORY DATA: pathology reports from [facility] reviewed    RADIOLOGY RESULTS:CT scans reviewed PET CT scan ordered   IMPRESSION: locally advanced squamous cell carcinoma the skin with invasion of orbit and maxillary sinus in 84 year old male  PLAN: at this time like to try course of palliative ration therapy. Would treat up to 4000 cGy over 4 weeks in evaluating for response and possible small field boost. I've ordered a PET CT  scan to help with might treatment portals. Risks and benefits of treatment including skin reaction fatigue possible development of cataract possible slight dryness of the left orbit possible xerostomia alteration of blood counts all were discussed in detail. I believe my palliation treat this tumor prevent further bleeding and may be help with local healing the near future. PET scan has been ordered and I've set him up for CT simulation shortly thereafter. Patient and daughter both seem to comprehend my treatment plan well.  I would like to take this opportunity to thank you for allowing me to participate in the  care of your patient.Noreene Filbert, MD

## 2018-01-11 ENCOUNTER — Encounter
Admission: RE | Admit: 2018-01-11 | Discharge: 2018-01-11 | Disposition: A | Discharge status: Home / Self Care | Payer: Medicare Other | Source: Ambulatory Visit | Attending: Radiation Oncology | Admitting: Radiation Oncology

## 2018-01-11 DIAGNOSIS — C4492 Squamous cell carcinoma of skin, unspecified: Secondary | ICD-10-CM | POA: Diagnosis not present

## 2018-01-11 LAB — GLUCOSE, CAPILLARY: Glucose-Capillary: 111 mg/dL — ABNORMAL HIGH (ref 70–99)

## 2018-01-11 MED ORDER — FLUDEOXYGLUCOSE F - 18 (FDG) INJECTION
8.9000 | Freq: Once | INTRAVENOUS | Status: AC | PRN
  Administered 2018-01-11: 8.9 via INTRAVENOUS

## 2018-01-14 ENCOUNTER — Ambulatory Visit
Admission: RE | Admit: 2018-01-14 | Discharge: 2018-01-14 | Disposition: A | Discharge status: Home / Self Care | Payer: Medicare Other | Source: Ambulatory Visit | Attending: Radiation Oncology | Admitting: Radiation Oncology

## 2018-01-14 DIAGNOSIS — C4492 Squamous cell carcinoma of skin, unspecified: Secondary | ICD-10-CM | POA: Insufficient documentation

## 2018-01-14 DIAGNOSIS — Z510 Encounter for antineoplastic radiation therapy: Secondary | ICD-10-CM | POA: Insufficient documentation

## 2018-01-15 ENCOUNTER — Encounter: Payer: Medicare Other | Attending: Physician Assistant | Admitting: Physician Assistant

## 2018-01-15 DIAGNOSIS — I110 Hypertensive heart disease with heart failure: Secondary | ICD-10-CM | POA: Diagnosis not present

## 2018-01-15 DIAGNOSIS — Z885 Allergy status to narcotic agent status: Secondary | ICD-10-CM | POA: Diagnosis not present

## 2018-01-15 DIAGNOSIS — I509 Heart failure, unspecified: Secondary | ICD-10-CM | POA: Insufficient documentation

## 2018-01-15 DIAGNOSIS — L98492 Non-pressure chronic ulcer of skin of other sites with fat layer exposed: Secondary | ICD-10-CM | POA: Diagnosis not present

## 2018-01-15 DIAGNOSIS — I4891 Unspecified atrial fibrillation: Secondary | ICD-10-CM | POA: Diagnosis not present

## 2018-01-15 DIAGNOSIS — J449 Chronic obstructive pulmonary disease, unspecified: Secondary | ICD-10-CM | POA: Insufficient documentation

## 2018-01-15 DIAGNOSIS — E11622 Type 2 diabetes mellitus with other skin ulcer: Secondary | ICD-10-CM | POA: Insufficient documentation

## 2018-01-15 DIAGNOSIS — Z510 Encounter for antineoplastic radiation therapy: Secondary | ICD-10-CM | POA: Diagnosis not present

## 2018-01-15 DIAGNOSIS — Z87891 Personal history of nicotine dependence: Secondary | ICD-10-CM | POA: Insufficient documentation

## 2018-01-15 DIAGNOSIS — G4730 Sleep apnea, unspecified: Secondary | ICD-10-CM | POA: Insufficient documentation

## 2018-01-16 ENCOUNTER — Ambulatory Visit: Payer: Medicare Other

## 2018-01-16 NOTE — Progress Notes (Signed)
Glenn Reeves ([id number]) Visit Report for 01/15/2018 Abuse/Suicide Risk Screen Details Patient Name: Glenn Reeves, Glenn A. Date of Service: 01/15/2018 10:30 AM Medical Record Number: [id number] Patient Account Number: [id number] Date of Birth/Sex: 06-Jan-1931 (84 y.o. Male) Treating RN: Roger Shelter Primary Care Mckyle Solanki: Ramonita Lab Other Clinician: Referring Tilly Pernice: Earlie Server Treating Kamran Coker/Extender: Melburn Hake, HOYT Weeks in Treatment: 0 Abuse/Suicide Risk Screen Items Answer ABUSE/SUICIDE RISK SCREEN: Has anyone close to you tried to hurt or harm you recentlyo No Do you feel uncomfortable with anyone in your familyo No Has anyone forced you do things that you didnot want to doo No Do you have any thoughts of harming yourselfo No Patient displays signs or symptoms of abuse and/or neglect. No Electronic Signature(s) Signed: 01/15/2018 2:05:43 PM By: Roger Shelter Entered By: Roger Shelter on 01/15/2018 10:54:44 [name], [name] AMarland Kitchen ([id number]) -------------------------------------------------------------------------------- Activities of Daily Living Details Patient Name: Glenn Reeves A. Date of Service: 01/15/2018 10:30 AM Medical Record Number: [id number] Patient Account Number: [id number] Date of Birth/Sex: 02/22/1931 (84 y.o. Male) Treating RN: Roger Shelter Primary Care Elfreida Heggs: Ramonita Lab Other Clinician: Referring Myliah Medel: Earlie Server Treating Maryana Pittmon/Extender: Melburn Hake, HOYT Weeks in Treatment: 0 Activities of Daily Living Items Answer Activities of Daily Living (Please select one for each item) Drive Automobile Need Assistance Take Medications Need Assistance Use Telephone Need Assistance Care for Appearance Need Assistance Use Toilet Need Assistance Bath / Shower Need Assistance Dress Self Need Assistance Feed Self Need Assistance Walk Need Assistance Get In / Out Bed Need Assistance Housework Need Assistance Prepare Meals Need  Assistance Handle Money Need Assistance Shop for Self Need Assistance Electronic Signature(s) Signed: 01/15/2018 2:05:43 PM By: Roger Shelter Entered By: Roger Shelter on 01/15/2018 10:55:10 Glenn Reeves ([id number]) -------------------------------------------------------------------------------- Education Assessment Details Patient Name: Glenn Reeves A. Date of Service: 01/15/2018 10:30 AM Medical Record Number: [id number] Patient Account Number: [id number] Date of Birth/Sex: [birth year] (84 y.o. Male) Treating RN: Roger Shelter Primary Care Dillion Stowers: Ramonita Lab Other Clinician: Referring Tiarah Shisler: Earlie Server Treating Dannie Woolen/Extender: Worthy Keeler Weeks in Treatment: 0 Primary Learner Assessed: Patient Learning Preferences/Education Level/Primary Language Learning Preference: Explanation Highest Education Level: High School Preferred Language: English Cognitive Barrier Assessment/Beliefs Language Barrier: No Translator Needed: No Memory Deficit: No Emotional Barrier: No Cultural/Religious Beliefs Affecting Medical Care: No Physical Barrier Assessment Impaired Vision: Yes Impaired Hearing: No Decreased Hand dexterity: No Knowledge/Comprehension Assessment Knowledge Level: Low Comprehension Level: Low Ability to understand written Low instructions: Ability to understand verbal Low instructions: Motivation Assessment Anxiety Level: Calm Cooperation: Cooperative Interest in Health Problems: Uninterested Willingness to Engage in Self- Low Management Activities: Readiness to Engage in Self- Low Management Activities: Electronic Signature(s) Signed: 01/15/2018 2:05:43 PM By: Roger Shelter Entered By: Roger Shelter on 01/15/2018 10:55:45 Glenn Reeves ([id number]) -------------------------------------------------------------------------------- Fall Risk Assessment Details Patient Name: Glenn Reeves A. Date of Service: 01/15/2018  10:30 AM Medical Record Number: [id number] Patient Account Number: [id number] Date of Birth/Sex: [birth year] (84 y.o. Male) Treating RN: Roger Shelter Primary Care Vee Bahe: Ramonita Lab Other Clinician: Referring Briasia Flinders: Earlie Server Treating Ainara Eldridge/Extender: Melburn Hake, HOYT Weeks in Treatment: 0 Fall Risk Assessment Items Have you had 2 or more falls in the last 12 monthso 0 No Have you had any fall that resulted in injury in the last 12 monthso 0 No FALL RISK ASSESSMENT: History of falling - immediate or within 3 months 0 No Secondary diagnosis 0 No Ambulatory aid None/bed rest/wheelchair/nurse 0 No Crutches/cane/walker 0 No Furniture 0 No IV Access/Saline Lock 0 No Gait/Training Normal/bed  rest/immobile 0 No Weak 0 No Impaired 0 No Mental Status Oriented to own ability 0 No Electronic Signature(s) Signed: 01/15/2018 2:05:43 PM By: Roger Shelter Entered By: Roger Shelter on 01/15/2018 10:55:52 Glenn Reeves, Glenn A. ([id number]) -------------------------------------------------------------------------------- Nutrition Risk Assessment Details Patient Name: Glenn Reeves A. Date of Service: 01/15/2018 10:30 AM Medical Record Number: [id number] Patient Account Number: [id number] Date of Birth/Sex: 1931/03/04 (84 y.o. Male) Treating RN: Roger Shelter Primary Care Tatiyanna Lashley: Ramonita Lab Other Clinician: Referring Elohim Brune: Earlie Server Treating Susie Pousson/Extender: Melburn Hake, HOYT Weeks in Treatment: 0 Height (in): 69 Weight (lbs): 166 Body Mass Index (BMI): 24.5 Nutrition Risk Assessment Items NUTRITION RISK SCREEN: I have an illness or condition that made me change the kind and/or amount of 0 No food I eat I eat fewer than two meals per day 0 No I eat few fruits and vegetables, or milk products 0 No I have three or more drinks of beer, liquor or wine almost every day 0 No I have tooth or mouth problems that make it hard for me to eat 0 No I don't always have enough  money to buy the food I need 0 No I eat alone most of the time 0 No I take three or more different prescribed or over-the-counter drugs a day 0 No Without wanting to, I have lost or gained 10 pounds in the last six months 0 No I am not always physically able to shop, cook and/or feed myself 0 No Nutrition Protocols Good Risk Protocol 0 No interventions needed Moderate Risk Protocol Electronic Signature(s) Signed: 01/15/2018 2:05:43 PM By: Roger Shelter Entered By: Roger Shelter on 01/15/2018 10:56:04

## 2018-01-18 NOTE — Progress Notes (Signed)
BELL, CAI ([id number]) Visit Report for 01/15/2018 Chief Complaint Document Details Patient Name: Glenn Reeves, Glenn Reeves. Date Glenn Service: 01/15/2018 10:30 AM Medical Record Number: [id number] Patient Account Number: [id number] Date Glenn Birth/Sex: 13-Oct-1930 (84 y.o. Male) Treating RN: Montey Hora Primary Care Provider: Ramonita Lab Other Clinician: Referring Provider: Earlie Server Treating Provider/Extender: Melburn Hake, Seung Nidiffer Weeks in Treatment: 0 Information Obtained from: Patient Chief Complaint Left temple malignant ulcer Electronic Signature(s) Signed: 01/15/2018 5:36:35 PM By: Worthy Keeler PA-C Entered By: Worthy Keeler on 01/15/2018 11:07:11 Kontos, Ka Reeves. ([id number]) -------------------------------------------------------------------------------- Debridement Details Patient Name: Glenn Reeves. Date Glenn Service: 01/15/2018 10:30 AM Medical Record Number: [id number] Patient Account Number: [id number] Date Glenn Birth/Sex: 03/11/1931 (84 y.o. Male) Treating RN: Montey Hora Primary Care Provider: Ramonita Lab Other Clinician: Referring Provider: Earlie Server Treating Provider/Extender: Melburn Hake, Mitsuye Schrodt Weeks in Treatment: 0 Debridement Performed for Wound #1 Left Head - Temporal Assessment: Performed By: Physician STONE III, Charnise Lovan E., PA-C Debridement Type: Chemical/Enzymatic/Mechanical Agent Used: saline Level Glenn Consciousness (Pre- Awake and Alert procedure): Pre-procedure Verification/Time Yes - 11:15 Out Taken: Start Time: 11:15 Pain Control: Lidocaine 4% Topical Solution Instrument: Other : gauze and saline Bleeding: Minimum Hemostasis Achieved: Pressure End Time: 11:16 Procedural Pain: 0 Post Procedural Pain: 0 Response to Treatment: Procedure was tolerated well Level Glenn Consciousness Awake and Alert (Post-procedure): Post Debridement Measurements Glenn Total Wound Length: (cm) 2.7 Width: (cm) 7.5 Depth: (cm) 0.2 Volume: (cm) 3.181 Character Glenn  Wound/Ulcer Post Debridement: Improved Post Procedure Diagnosis Same as Pre-procedure Electronic Signature(s) Signed: 01/15/2018 5:36:35 PM By: Worthy Keeler PA-C Signed: 01/16/2018 5:04:30 PM By: Montey Hora Entered By: Montey Hora on 01/15/2018 11:19:52 Engebretsen, Glenn Reeves. ([id number]) -------------------------------------------------------------------------------- HPI Details Patient Name: Glenn Reeves. Date Glenn Service: 01/15/2018 10:30 AM Medical Record Number: [id number] Patient Account Number: [id number] Date Glenn Birth/Sex: [birth year] (84 y.o. 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 HPI Description: 01/15/18 on evaluation today patient is seen for initial evaluation and clinic as result Glenn Reeves squamous cell carcinoma Glenn the left temporal region which has been present now for several years. The patient actually went to have Reeves Mohs surgery what sounds to be somewhere between 2-3 years ago. Nonetheless during the most surgery the surgeon/pathologist stated that he could not really clear the margins and felt that Reeves plastic surgery referral may be necessary. Subsequently the patient was actually eventually referred to [facility] for plastic surgery to remove the carcinoma and at that point there was no bone involvement according to the patient's daughter. Nonetheless due to several extenuating circumstances initially the surgery was postponed about Reeves surgeon due to some conflict secondarily to that it was again postponed due to the patient being ill during the time the surgery was put supposed to be next performed. Nonetheless in the end the patient opted to  forgo any surgery whatsoever following and did not really have reevaluation until 12/26/17 when he did have bone involvement. The CT scan reads that the patient has Reeves large left frontal scalp mass measuring up to 8.8 x 3.5 cm with extensive erosion Glenn the left frontal calvarium, left frontal sinus, left maxillary process, in the superior and lateral walls Glenn the left orbit. This appears  to be Reeves invasive skin carcinoma. Unfortunately at this time the decision has been made to proceed with radiation therapy as Reeves palliative process not curative currently. Basically the patient comes in today by way Glenn his daughter who is his primary caretaker at this point in order for [place] to recommend options as far as dressings are concerned to help manage this in Reeves palliative manner patient's hemoglobin A1c when last taken was 6.6 and this was on 12/21/17. Currently the patient does have pain at the site Glenn the cancer and there is Reeves definite open wound at the site as well which does drain although his daughter states it does not seem to drain to significantly. There is no evidence Glenn infection at this time. Electronic Signature(s) Signed: 01/15/2018 5:36:35 PM By: Worthy Keeler PA-C Entered By: Worthy Keeler on 01/15/2018 17:20:03 Glenn Reeves ([id number]) -------------------------------------------------------------------------------- Physical Exam Details Patient Name: Glenn Reeves. Date Glenn Service: 01/15/2018 10:30 AM Medical Record Number: [id number] Patient Account Number: [id number] Date Glenn Birth/Sex: [birth year] (84 y.o. Male) Treating RN: Montey Hora Primary Care Provider: Ramonita Lab Other Clinician: Referring Provider: Earlie Server Treating Provider/Extender: Melburn Hake, Jeanna Giuffre Weeks in Treatment: 0 Constitutional sitting or standing blood pressure is within target range for patient.. pulse regular and within target range for patient.Marland Kitchen respirations regular, non-labored and within target range  for patient.Marland Kitchen temperature within target range for patient.. Well- nourished and well-hydrated in no acute distress. Eyes conjunctiva clear no eyelid edema noted. pupils equal round and reactive to light and accommodation. Ears, Nose, Mouth, and Throat no gross abnormality Glenn ear auricles or external auditory canals. normal hearing noted during conversation. mucus membranes moist. Respiratory normal breathing without difficulty. clear to auscultation bilaterally. Cardiovascular regular rate and rhythm with normal S1, S2. no clubbing, cyanosis, significant edema, <3 sec cap refill. Gastrointestinal (GI) soft, non-tender, non-distended, +BS. no ventral hernia noted. Musculoskeletal Patient unable to walk without assistance. Psychiatric this patient is able to make decisions and demonstrates good insight into disease process. Alert and Oriented x 3. pleasant and cooperative. Notes On evaluation today the patient actually appears to have some evidence Glenn eschar covering Reeves portion Glenn the wound although this is very soupy underneath and very loose. Gently using saline and gauze I was able to mechanically debride away the eschar in order to expose the open portion Glenn the wound so that we can better perform dressing changes for him. He has Reeves significant swelling around the periwound Glenn the [employer] again I think this is definitely secondary to the carcinoma and how invasive it has become unfortunately. Fortunately again there's no evidence Glenn infection. Electronic Signature(s) Signed: 01/15/2018 5:36:35 PM By: Worthy Keeler PA-C Entered By: Worthy Keeler on 01/15/2018 17:21:10 Glenn Reeves ([id number]) -------------------------------------------------------------------------------- Physician Orders Details Patient Name: Glenn Reeves. Date Glenn Service: 01/15/2018 10:30 AM Medical Record Number: [id number] Patient Account Number: [id number] Date Glenn Birth/Sex: October 11, 1930 (84 y.o.  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 Primary Wound Dressing Wound #1 Left Head - Temporal o Silver Alginate Secondary Dressing Wound #1 Left Head - Temporal o Non-adherent pad - secure with paper tape Dressing Change Frequency Wound #1 Left Head - Temporal o Change dressing every day. Follow-up Appointments Wound #1 Left Head - Temporal o Return Appointment in 3 weeks. Home Health Wound #1 Left Head - Temporal o Initiate Home Health for [place] Nurse may visit PRN to address patientos wound care needs. o FACE TO FACE ENCOUNTER: MEDICARE and MEDICAID PATIENTS: I certify that this patient is under my care and that I had Reeves face-to-face encounter that meets the physician face-to-face encounter requirements with this patient on this date. The encounter with the patient was in whole or in part for the following MEDICAL CONDITION: (primary reason for [place]) MEDICAL NECESSITY: I certify, that based on my findings, NURSING services are Reeves medically necessary home health service. HOME BOUND STATUS: I certify that my clinical findings support that this patient is homebound (i.e., Due to illness or injury, pt requires aid Glenn supportive devices such as crutches, cane, wheelchairs, walkers, the use Glenn special transportation or the assistance Glenn another  person to leave their place Glenn residence. There is Reeves normal inability to leave the home and doing so requires considerable and taxing effort. Other absences are for medical reasons / religious services and are infrequent or Glenn short duration when for other reasons). EITHEN, CASTIGLIA Reeves. ([id number]) o If current dressing causes Reeves in wound condition, may D/C ordered dressing product/s and apply Normal Saline Moist Dressing daily until next [place] / Other MD appointment. Stuart Glenn Reeves in wound condition at [phone]. o Please direct any NON-WOUND related issues/requests for orders to patient's Primary Care Physician Electronic Signature(s) Signed: 01/15/2018 5:36:35 PM By: Worthy Keeler PA-C Signed: 01/16/2018 5:04:30 PM By: Montey Hora Entered By: Montey Hora on 01/15/2018 11:18:03 Glenn Reeves, Glenn [place]. ([id number]) -------------------------------------------------------------------------------- Problem List Details Patient Name: Glenn Reeves. Date Glenn Service: 01/15/2018 10:30 AM Medical Record Number: [id number] Patient Account Number: [id number] Date Glenn Birth/Sex: 1930/04/25 (84 y.o. 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 ([id number]) -------------------------------------------------------------------------------- Progress Note Details Patient Name: Glenn Reeves. Date Glenn Service: 01/15/2018 10:30 AM Medical Record Number: [id number] Patient Account Number: [id number] Date Glenn Birth/Sex: 1930-07-10 (84 y.o. 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 01/15/18 on evaluation today patient is seen for initial evaluation and clinic as result Glenn Reeves  squamous cell carcinoma Glenn the left temporal region which has been present now for several years. The patient actually went to have Reeves Mohs surgery what sounds to be somewhere between 2-3 years ago. Nonetheless during the most surgery the surgeon/pathologist stated that he could not really clear the margins and felt that Reeves plastic surgery referral may be necessary. Subsequently the patient was actually eventually referred to [facility] for plastic surgery to remove the carcinoma and at that point there was no bone involvement according to the patient's daughter. Nonetheless due to several extenuating circumstances initially the surgery was postponed about Reeves surgeon due to some conflict secondarily to that it was again postponed due to the patient being ill during the time the surgery was put supposed to be next performed. Nonetheless in the end the patient opted to forgo any surgery whatsoever following and did not really have reevaluation until 12/26/17 when he did have bone involvement. The  CT scan reads that the patient has Reeves large left frontal scalp mass measuring up to 8.8 x 3.5 cm with extensive erosion Glenn the left frontal calvarium, left frontal sinus, left maxillary process, in the superior and lateral walls Glenn the left orbit. This appears to be Reeves invasive skin carcinoma. Unfortunately at this time the decision has been made to proceed with radiation therapy as Reeves palliative process not curative currently. Basically the patient comes in today by way Glenn his daughter who is his primary caretaker at this point in order for [place] to recommend options as far as dressings are concerned to help manage this in Reeves palliative manner patient's hemoglobin A1c when last taken was 6.6 and this was on 12/21/17. Currently the patient does have pain at the site Glenn the cancer and there is Reeves definite open wound at the site as well which does drain although his daughter states it does not seem to drain to significantly. There is no evidence Glenn infection at this time. Wound History Patient presents with 1 open wound that has been present for approximately 02/23/2017. Patient has been treating wound in the following manner: peroxide/ saline bandage. The wound has been healed in the past but has re-opened. Laboratory tests have not been performed in the last month. Patient reportedly has not tested positive for an antibiotic resistant organism. Patient reportedly has not tested positive for osteomyelitis. Patient reportedly has not had testing performed to evaluate circulation in the legs. Patient History Information obtained from Patient. Allergies codeine Family History Heart Disease - Father,Siblings, Stroke - Siblings, No family history Glenn Cancer, Diabetes, Hypertension, Kidney Disease, Lung Disease, Seizures, Thyroid Problems, Tuberculosis. Glenn Reeves, Glenn Reeves ([id number]) Social History Former smoker - cigar, Marital Status - Widowed, Alcohol Use - Never, Drug Use - No History, Caffeine Use -  Rarely. Medical History Eyes Patient has history Glenn Cataracts - removed Denies history Glenn Glaucoma, Optic Neuritis Ear/Nose/Mouth/Throat Denies history Glenn Chronic sinus problems/congestion, Middle ear problems Hematologic/Lymphatic Denies history Glenn Anemia, Hemophilia, Human Immunodeficiency Virus, Lymphedema, Sickle Cell Disease Respiratory Patient has history Glenn Chronic Obstructive Pulmonary Disease (COPD), Sleep Apnea Denies history Glenn Asthma, Pneumothorax, Tuberculosis Cardiovascular Patient has history Glenn Arrhythmia - afib, Congestive Heart Failure, Hypertension Denies history Glenn Coronary Artery Disease, Deep Vein Thrombosis, Hypotension, Myocardial Infarction, Peripheral Arterial Disease, Peripheral Venous Disease, Phlebitis, Vasculitis Gastrointestinal Denies history Glenn Cirrhosis , Colitis, Crohn s, Hepatitis Reeves, Hepatitis B, Hepatitis C Endocrine Patient has history Glenn Type II Diabetes Denies history Glenn Type I Diabetes Genitourinary Denies history Glenn End Stage  Renal Disease Immunological Denies history Glenn Lupus Erythematosus, Raynaud s, Scleroderma Integumentary (Skin) Denies history Glenn History Glenn Burn, History Glenn pressure wounds Musculoskeletal Denies history Glenn Gout, Rheumatoid Arthritis, Osteoarthritis, Osteomyelitis Neurologic Denies history Glenn Dementia, Neuropathy, Quadriplegia, Paraplegia, Seizure Disorder Oncologic Denies history Glenn Received Chemotherapy, Received Radiation Patient is treated with Insulin. Blood sugar is tested. Blood sugar results noted at the following times: Breakfast - 90. Medical And Surgical History Notes Ear/Nose/Mouth/Throat left ear squamous cell removed Cardiovascular Dr Ramonita Lab has pulmonary fibrosis Review Glenn Systems (ROS) Constitutional Symptoms (General Health) Complains or has symptoms Glenn Marked Weight Change - poor appetite. Denies complaints or symptoms Glenn Fatigue, Fever, Chills. Eyes Complains or has symptoms Glenn Vision  Changes, Glasses / Contacts - script. Ear/Nose/Mouth/Throat Denies complaints or symptoms Glenn Difficult clearing ears, Sinusitis. Hematologic/Lymphatic Complains or has symptoms Glenn Bleeding / Clotting Disorders - GI bleeds history with 3 stents. Respiratory Complains or has symptoms Glenn Shortness Glenn Breath - while resting. Cardiovascular Denies complaints or symptoms Glenn Chest pain, LE edema. Gastrointestinal Glenn Reeves, Glenn Reeves. ([id number]) Denies complaints or symptoms Glenn Frequent diarrhea, Nausea, Vomiting. Endocrine Denies complaints or symptoms Glenn Hepatitis, Thyroid disease, Polydypsia (Excessive Thirst). Genitourinary Complains or has symptoms Glenn Kidney failure/ Dialysis - renal insufficiency. Denies complaints or symptoms Glenn Incontinence/dribbling. Immunological Denies complaints or symptoms Glenn Itching. Integumentary (Skin) Complains or has symptoms Glenn Wounds, Bleeding or bruising tendency, Breakdown, Swelling. Musculoskeletal Denies complaints or symptoms Glenn Muscle Pain, Muscle Weakness. Neurologic Denies complaints or symptoms Glenn Numbness/parasthesias, Focal/Weakness. Oncologic squamous cell carcinoma Objective Constitutional sitting or standing blood pressure is within target range for patient.. pulse regular and within target range for patient.Marland Kitchen respirations regular, non-labored and within target range for patient.Marland Kitchen temperature within target range for patient.. Well- nourished and well-hydrated in no acute distress. Vitals Time Taken: 10:35 AM, Height: 69 in, Source: Stated, Weight: 166 lbs, Source: Stated, BMI: 24.5, Temperature: 97.9  F, Pulse: 69 bpm, Respiratory Rate: 18 breaths/min, Blood Pressure: 103/53 mmHg. Eyes conjunctiva clear no eyelid edema noted. pupils equal round and reactive to light and accommodation. Ears, Nose, Mouth, and Throat no gross abnormality Glenn ear auricles or external auditory canals. normal hearing noted during conversation.  mucus membranes moist. Respiratory normal breathing without difficulty. clear to auscultation bilaterally. Cardiovascular regular rate and rhythm with normal S1, S2. no clubbing, cyanosis, significant edema, Gastrointestinal (GI) soft, non-tender, non-distended, +BS. no ventral hernia noted. Musculoskeletal Patient unable to walk without assistance. Psychiatric this patient is able to make decisions and demonstrates good insight into disease process. Alert and Oriented x 3. pleasant and cooperative. Glenn Reeves, Glenn Reeves. ([id number]) General Notes: On evaluation today the patient actually appears to have some evidence Glenn eschar covering Reeves portion Glenn the wound although this is very soupy underneath and very loose. Gently using saline and gauze I was able to mechanically debride away the eschar in order to expose the open portion Glenn the wound so that we can better perform dressing changes for him. He has Reeves significant swelling around the periwound Glenn the [employer] again I think this is definitely secondary to the carcinoma and how invasive it has become unfortunately. Fortunately again there's no evidence Glenn infection. Integumentary (Hair, Skin) Wound #1 status is Open. Original cause Glenn wound was Other Lesion. The wound is located on the Left Head - Temporal. The wound measures 2.7cm length x 7.5cm width x 0.2cm depth; 15.904cm^2 area and 3.181cm^3 volume. There is Fat Layer (Subcutaneous Tissue) Exposed exposed. There is no tunneling  or undermining noted. There is Reeves large amount Glenn serosanguineous drainage noted. The wound margin is indistinct and nonvisible. There is medium (34-66%) red, pink granulation within the wound bed. There is Reeves medium (34-66%) amount Glenn necrotic tissue within the wound bed including Eschar. The periwound skin appearance did not exhibit: Callus, Crepitus, Excoriation, Induration, Rash, Scarring, Dry/Scaly, Maceration, Atrophie Blanche, Cyanosis, Ecchymosis, Hemosiderin  Staining, Mottled, Pallor, Rubor, Erythema. Periwound temperature was noted as No Abnormality. 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 There was Reeves Chemical/Enzymatic/Mechanical debridement performed by STONE III, Glenn Knippenberg E., PA-C. With the following instrument(s): gauze and saline after achieving pain control using Lidocaine 4% Topical Solution. Other agent used was saline. Reeves time out was conducted at 11:15, prior to the start Glenn the procedure. Reeves Minimum amount Glenn bleeding was controlled with Pressure. The procedure was tolerated well with Reeves pain level Glenn 0 throughout and Reeves pain level Glenn 0 following the procedure. Post Debridement Measurements: 2.7cm length x 7.5cm width x 0.2cm depth; 3.181cm^3 volume. Character Glenn Wound/Ulcer Post Debridement is improved. Post procedure Diagnosis Wound #1: Same as Pre-Procedure Plan Wound Cleansing: Wound #1 Left Head - Temporal: Cleanse wound with mild soap and water May Shower, gently pat wound dry prior to applying new dressing. Primary Wound Dressing: Glenn Reeves, Glenn Reeves. ([id number]) Wound #1 Left Head - Temporal: Silver Alginate Secondary Dressing: Wound #1 Left Head - Temporal: Non-adherent pad - secure with paper tape Dressing Change Frequency: Wound #1 Left Head - Temporal: Change dressing every day. Follow-up Appointments: Wound #1 Left Head - Temporal: Return Appointment in 3 weeks. Home Health: Wound #1 Left Head - Temporal: [place] for [place] Nurse may visit PRN to address patient s wound care needs. FACE TO FACE ENCOUNTER: MEDICARE and MEDICAID PATIENTS: I certify that this patient is under my  care and that I had Reeves face-to-face encounter that meets the physician face-to-face encounter requirements with this patient on this date. The encounter with the patient was in whole or in part for the following MEDICAL CONDITION: (primary reason for [place]) MEDICAL NECESSITY: I certify, that based on my findings, NURSING services are Reeves medically necessary home health service. HOME BOUND STATUS: I certify that my clinical findings support that this patient is homebound (i.e., Due to illness or injury, pt requires aid Glenn supportive devices such as crutches, cane, wheelchairs, walkers, the use Glenn special transportation or the assistance Glenn another person to leave their place Glenn residence. There is Reeves normal inability to leave the home and doing so requires considerable and taxing effort. Other absences are for medical reasons / religious services and are infrequent or Glenn short duration when for other reasons). If current dressing causes Reeves in wound condition, may D/C ordered dressing product/s and apply Normal Saline Moist Dressing daily until next [place] / Other MD appointment. Glenn Reeves Glenn Reeves in wound condition at [phone]. Please direct any NON-WOUND related issues/requests for orders to patient's Primary Care Physician I'm gonna suggest at this point that we continue with the above wound care measures over the next three weeks. Time. The patient and his daughter are in agreement with plan. Again our treatment plan currently is  going to be palliative. He will be starting radiation in the time between then and now again as Reeves palliative measure. We will see where everything stands at that point. If anything changes in the interim patient's daughter will contact me for additional recommendations. If we need to get them in sooner we can also do so as well. Please see above for specific wound care orders. We will see patient for re-evaluation  in 3 week(s) here in the clinic. If anything worsens or changes patient will contact our office for additional recommendations. Electronic Signature(s) Signed: 01/15/2018 5:36:35 PM By: Worthy Keeler PA-C Entered By: Worthy Keeler on 01/15/2018 17:22:02 Glenn Reeves ([id number]) -------------------------------------------------------------------------------- ROS/PFSH Details Patient Name: Glenn Reeves. Date Glenn Service: 01/15/2018 10:30 AM Medical Record Number: [id number] Patient Account Number: [id number] Date Glenn Birth/Sex: 05/29/1930 (84 y.o. Male) Treating RN: Roger Shelter Primary Care Provider: Ramonita Lab Other Clinician: Referring Provider: Earlie Server Treating Provider/Extender: Melburn Hake, Sayge Brienza Weeks in Treatment: 0 Information Obtained From Patient Wound History Do you currently have one or more open woundso Yes How many open wounds do you currently haveo 1 Approximately how long have you had your woundso 02/23/2017 How have you been treating your wound(s) until nowo peroxide/ saline bandage Has your wound(s) ever healed and then re-openedo Yes Have you had any lab work done in the past montho No Have you tested positive for an antibiotic resistant organism (MRSA, VRE)o No Have you tested positive for osteomyelitis (bone infection)o No Have you had any tests for circulation on your legso No Constitutional Symptoms (General Health) Complaints and Symptoms: Positive for: Marked Weight Change - poor appetite Negative for: Fatigue; Fever; Chills Eyes Complaints and Symptoms: Positive for: Vision Changes; Glasses / Contacts - script Medical History: Positive for: Cataracts - removed Negative for: Glaucoma; Optic Neuritis Ear/Nose/Mouth/Throat Complaints and Symptoms: Negative for: Difficult clearing ears; Sinusitis Medical History: Negative for: Chronic sinus problems/congestion; Middle ear problems Past Medical History Notes: left ear squamous cell  removed Hematologic/Lymphatic Complaints and Symptoms: Positive for: Bleeding / Clotting Disorders - GI bleeds history with 3 stents Medical History: Negative for: Anemia; Hemophilia; Human Immunodeficiency Virus; Lymphedema; Sickle Cell Disease Respiratory Complaints and Symptoms: Positive for: Shortness Glenn Breath - while resting Glenn Reeves, Glenn Reeves. ([id number]) Medical History: Positive for: Chronic Obstructive Pulmonary Disease (COPD); Sleep Apnea Negative for: Asthma; Pneumothorax; Tuberculosis Cardiovascular Complaints and Symptoms: Negative for: Chest pain; LE edema Medical History: Positive for: Arrhythmia - afib; Congestive Heart Failure; Hypertension Negative for: Coronary Artery Disease; Deep Vein Thrombosis; Hypotension; Myocardial Infarction; Peripheral Arterial Disease; Peripheral Venous Disease; Phlebitis; Vasculitis Past Medical History Notes: Dr Ramonita Lab has pulmonary fibrosis Gastrointestinal Complaints and Symptoms: Negative for: Frequent diarrhea; Nausea; Vomiting Medical History: Negative for: Cirrhosis ; Colitis; Crohnos; Hepatitis Reeves; Hepatitis B; Hepatitis C Endocrine Complaints and Symptoms: Negative for: Hepatitis; Thyroid disease; Polydypsia (Excessive Thirst) Medical History: Positive for: Type II Diabetes Negative for: Type I Diabetes Time with diabetes: 20 years Treated with: Insulin Blood sugar tested every day: Yes Tested : tid Blood sugar testing results: Breakfast: 90 Genitourinary Complaints and Symptoms: Positive for: Kidney failure/ Dialysis - renal insufficiency Negative for: Incontinence/dribbling Medical History: Negative for: End Stage Renal Disease Immunological Complaints and Symptoms: Negative for: Itching Medical History: Negative for: Lupus Erythematosus; Raynaudos; Scleroderma Integumentary (Skin) Glenn Reeves, Glenn Reeves. ([id number]) Complaints and Symptoms: Positive for: Wounds; Bleeding or bruising tendency; Breakdown;  Swelling Medical History: Negative for: History Glenn Burn; History Glenn pressure wounds Musculoskeletal Complaints and Symptoms: Negative  for: Muscle Pain; Muscle Weakness Medical History: Negative for: Gout; Rheumatoid Arthritis; Osteoarthritis; Osteomyelitis Neurologic Complaints and Symptoms: Negative for: Numbness/parasthesias; Focal/Weakness Medical History: Negative for: Dementia; Neuropathy; Quadriplegia; Paraplegia; Seizure Disorder Oncologic Complaints and Symptoms: Review Glenn System Notes: squamous cell carcinoma Medical History: Negative for: Received Chemotherapy; Received Radiation HBO Extended History Items Eyes: Cataracts Immunizations Pneumococcal Vaccine: Received Pneumococcal Vaccination: No Implantable Devices Family and Social History Cancer: No; Diabetes: No; Heart Disease: Yes - Father,Siblings; Hypertension: No; Kidney Disease: No; Lung Disease: No; Seizures: No; Stroke: Yes - Siblings; Thyroid Problems: No; Tuberculosis: No; Former smoker - cigar; Marital Status - Widowed; Alcohol Use: Never; Drug Use: No History; Caffeine Use: Rarely; Financial Concerns: No; Food, Clothing or Shelter Needs: No; Support System Lacking: No; Transportation Concerns: No; Advanced Directives: Yes (Not Provided); Do not resuscitate: No; Living Will: Yes (Not Provided); Medical Power Glenn Attorney: No Electronic Signature(s) Signed: 01/15/2018 2:05:43 PM By: Roger Shelter Signed: 01/15/2018 5:36:35 PM By: Worthy Keeler PA-C Entered By: Roger Shelter on 01/15/2018 10:54:34 Yardley, Mcadoo Reeves. ([id number]) -------------------------------------------------------------------------------- SuperBill Details Patient Name: Glenn Reeves. Date Glenn Service: 01/15/2018 Medical Record Number: [id number] Patient Account Number: [id number] Date Glenn Birth/Sex: [birth year] (84 y.o. 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 E11.622 Type 2 diabetes mellitus with other skin ulcer I10 Essential (primary) hypertension Facility Procedures CPT4 Code: 40814481 Description: 99214 - WOUND CARE VISIT-LEV 4 EST PT Modifier: Quantity: 1 Physician Procedures CPT4 Code: 8563149 Description: WC PHYS LEVEL 3 o NEW PT ICD-10 Diagnosis Description C44.92 Squamous cell carcinoma Glenn skin, unspecified 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

## 2018-01-18 NOTE — Progress Notes (Signed)
Glenn Reeves, Glenn Reeves ([id number]) Visit Report for 01/15/2018 Allergy List Details Patient Name: Glenn Reeves, Glenn A. Date of Service: 01/15/2018 10:30 AM Medical Record Number: [id number] Patient Account Number: [id number] Date of Birth/Sex: 13-Sep-1930 (84 y.o. Male) Treating RN: Roger Shelter Primary Care Kapri Nero: Ramonita Lab Other Clinician: Referring Lilliane Sposito: Earlie Server Treating Kase Shughart/Extender: STONE III, HOYT Weeks in Treatment: 0 Allergies Active Allergies codeine Allergy Notes Electronic Signature(s) Signed: 01/15/2018 2:05:43 PM By: Roger Shelter Entered By: Roger Shelter on 01/15/2018 11:08:55 Trembath, Kemoni A. ([id number]) -------------------------------------------------------------------------------- Arrival Information Details Patient Name: Glenn Bras A. Date of Service: 01/15/2018 10:30 AM Medical Record Number: [id number] Patient Account Number: [id number] Date of Birth/Sex: [birth year] (84 y.o. Male) Treating RN: Montey Hora Primary Care Cesiah Westley: Ramonita Lab Other Clinician: Referring Raniyah Curenton: Earlie Server Treating Steffani Dionisio/Extender: Melburn Hake, HOYT Weeks in Treatment: 0 Visit Information Patient Arrived: Wheel Chair Arrival Time: 10:37 Accompanied By: daughter Transfer Assistance: Manual Patient Identification Verified: Yes Secondary Verification Process Completed: Yes Electronic Signature(s) Signed: 01/15/2018 4:26:07 PM By: Lorine Bears RCP, RRT, CHT Entered By: Lorine Bears on 01/15/2018 10:37:33 Glenn Reeves ([id number]) -------------------------------------------------------------------------------- Clinic Level of Care Assessment Details Patient Name: Glenn Bras A. Date of Service: 01/15/2018 10:30 AM Medical Record Number: [id number] Patient Account Number: [id number] Date of Birth/Sex: 1931-03-08 (84 y.o. Male) Treating RN: Montey Hora Primary Care Emma Birchler: Ramonita Lab Other  Clinician: Referring Zarielle Cea: Earlie Server Treating Garrit Marrow/Extender: Melburn Hake, HOYT Weeks in Treatment: 0 Clinic Level of Care Assessment Items TOOL 2 Quantity Score []  - Use when only an EandM is performed on the INITIAL visit 0 ASSESSMENTS - Nursing Assessment / Reassessment X - General Physical Exam (combine w/ comprehensive assessment (listed just below) when 1 20 performed on new pt. evals) X- 1 25 Comprehensive Assessment (HX, ROS, Risk Assessments, Wounds Hx, etc.) ASSESSMENTS - Wound and Skin Assessment / Reassessment X - Simple Wound Assessment / Reassessment - one wound 1 5 []  - 0 Complex Wound Assessment / Reassessment - multiple wounds []  - 0 Dermatologic / Skin Assessment (not related to wound area) ASSESSMENTS - Ostomy and/or Continence Assessment and Care []  - Incontinence Assessment and Management 0 []  - 0 Ostomy Care Assessment and Management (repouching, etc.) PROCESS - Coordination of Care X - Simple Patient / Family Education for ongoing care 1 15 []  - 0 Complex (extensive) Patient / Family Education for ongoing care X- 1 10 Staff obtains Programmer, systems, Records, Test Results / Process Orders []  - 0 Staff telephones HHA, Nursing Homes / Clarify orders / etc []  - 0 Routine Transfer to another Facility (non-emergent condition) []  - 0 Routine Hospital Admission (non-emergent condition) X- 1 15 New Admissions / Biomedical engineer / Ordering NPWT, Apligraf, etc. []  - 0 Emergency Hospital Admission (emergent condition) X- 1 10 Simple Discharge Coordination []  - 0 Complex (extensive) Discharge Coordination PROCESS - Special Needs []  - Pediatric / Minor Patient Management 0 []  - 0 Isolation Patient Management Glenn Reeves, Glenn A. ([id number]) []  - 0 Hearing / Language / Visual special needs []  - 0 Assessment of Community assistance (transportation, D/C planning, etc.) []  - 0 Additional assistance / Altered mentation []  - 0 Support Surface(s)  Assessment (bed, cushion, seat, etc.) INTERVENTIONS - Wound Cleansing / Measurement X - Wound Imaging (photographs - any number of wounds) 1 5 []  - 0 Wound Tracing (instead of photographs) X- 1 5 Simple Wound Measurement - one wound []  - 0 Complex Wound Measurement - multiple wounds X- 1 5 Simple Wound Cleansing - one wound []  -  0 Complex Wound Cleansing - multiple wounds INTERVENTIONS - Wound Dressings X - Small Wound Dressing one or multiple wounds 1 10 []  - 0 Medium Wound Dressing one or multiple wounds []  - 0 Large Wound Dressing one or multiple wounds []  - 0 Application of Medications - injection INTERVENTIONS - Miscellaneous []  - External ear exam 0 []  - 0 Specimen Collection (cultures, biopsies, blood, body fluids, etc.) []  - 0 Specimen(s) / Culture(s) sent or taken to Lab for analysis []  - 0 Patient Transfer (multiple staff / Civil Service fast streamer / Similar devices) []  - 0 Simple Staple / Suture removal (25 or less) []  - 0 Complex Staple / Suture removal (26 or more) []  - 0 Hypo / Hyperglycemic Management (close monitor of Blood Glucose) []  - 0 Ankle / Brachial Index (ABI) - do not check if billed separately Has the patient been seen at the hospital within the last three years: Yes Total Score: 125 Level Of Care: New/Established - Level 4 Electronic Signature(s) Signed: 01/16/2018 5:04:30 PM By: Montey Hora Entered By: Montey Hora on 01/15/2018 11:13:41 Glenn Reeves, Glenn A. ([id number]) -------------------------------------------------------------------------------- Lower Extremity Assessment Details Patient Name: Glenn Bras A. Date of Service: 01/15/2018 10:30 AM Medical Record Number: [id number] Patient Account Number: [id number] Date of Birth/Sex: 1930/12/02 (84 y.o. Male) Treating RN: Roger Shelter Primary Care Tito Ausmus: Ramonita Lab Other Clinician: Referring Azazel Franze: Earlie Server Treating Raygen Dahm/Extender: Melburn Hake, HOYT Weeks in Treatment:  0 Electronic Signature(s) Signed: 01/15/2018 2:05:43 PM By: Roger Shelter Entered By: Roger Shelter on 01/15/2018 10:59:21 Glenn Reeves, Glenn A. ([id number]) -------------------------------------------------------------------------------- Multi Wound Chart Details Patient Name: Glenn Bras A. Date of Service: 01/15/2018 10:30 AM Medical Record Number: [id number] Patient Account Number: [id number] Date of Birth/Sex: 03-Nov-1930 (84 y.o. Male) Treating RN: Montey Hora Primary Care Lambert Jeanty: Ramonita Lab Other Clinician: Referring Enes Wegener: Earlie Server Treating Genowefa Morga/Extender: Melburn Hake, HOYT Weeks in Treatment: 0 Vital Signs Height(in): 69 Pulse(bpm): 13 Weight(lbs): 166 Blood Pressure(mmHg): 103/53 Body Mass Index(BMI): 25 Temperature(F): 97.9 Respiratory Rate 18 (breaths/min): Photos: [1:No Photos] [N/A:N/A] Wound Location: [1:Left Head - Temporal] [N/A:N/A] Wounding Event: [1:Other Lesion] [N/A:N/A] Primary Etiology: [1:Malignant Wound] [N/A:N/A] Comorbid History: [1:Cataracts, Chronic Obstructive N/A Pulmonary Disease (COPD), Sleep Apnea, Arrhythmia, Congestive Heart Failure, Hypertension, Type II Diabetes] Date Acquired: [1:02/23/2017] [N/A:N/A] Weeks of Treatment: [1:0] [N/A:N/A] Wound Status: [1:Open] [N/A:N/A] Measurements L x W x D [1:2.7x7.5x0.2] [N/A:N/A] (cm) Area (cm) : [1:15.904] [N/A:N/A] Volume (cm) : [1:3.181] [N/A:N/A] Classification: [1:Full Thickness Without Exposed Support Structures] [N/A:N/A] Exudate Amount: [1:Large] [N/A:N/A] Exudate Type: [1:Serosanguineous] [N/A:N/A] Exudate Color: [1:red, brown] [N/A:N/A] Wound Margin: [1:Indistinct, nonvisible] [N/A:N/A] Granulation Amount: [1:Medium (34-66%)] [N/A:N/A] Granulation Quality: [1:Red, Pink] [N/A:N/A] Necrotic Amount: [1:Medium (34-66%)] [N/A:N/A] Necrotic Tissue: [1:Eschar] [N/A:N/A] Exposed Structures: [1:Fat Layer (Subcutaneous Tissue) Exposed: Yes Fascia: No Tendon: No Muscle: No  Joint: No Bone: No] [N/A:N/A] Epithelialization: [1:None] [N/A:N/A] Periwound Skin Texture: [1:Excoriation: No Induration: No Callus: No] [N/A:N/A] Crepitus: No Rash: No Scarring: No Periwound Skin Moisture: Maceration: No N/A N/A Dry/Scaly: No Periwound Skin Color: Atrophie Blanche: No N/A N/A Cyanosis: No Ecchymosis: No Erythema: No Hemosiderin Staining: No Mottled: No Pallor: No Rubor: No Temperature: No Abnormality N/A N/A Tenderness on Palpation: No N/A N/A Wound Preparation: Ulcer Cleansing: N/A N/A Rinsed/Irrigated with Saline Topical Anesthetic Applied: Other: lidocaine 4% Treatment Notes Electronic Signature(s) Signed: 01/16/2018 5:04:30 PM By: Montey Hora Entered By: Montey Hora on 01/15/2018 11:11:17 Glenn Reeves ([id number]) -------------------------------------------------------------------------------- [place] Details Patient Name: Glenn Bras A. Date of Service: 01/15/2018 10:30 AM Medical Record Number: [id number] Patient Account Number:  [id number] Date of Birth/Sex: Apr 25, 1930 (84 y.o. Male) Treating RN: Montey Hora Primary Care Dakotah Heiman: Ramonita Lab Other Clinician: Referring Vern Guerette: Earlie Server Treating Keelynn Furgerson/Extender: Melburn Hake, HOYT Weeks in Treatment: 0 Active Inactive ` Abuse / Safety / Falls / Self Care Management Nursing Diagnoses: Impaired physical mobility Goals: Patient will remain injury free related to falls Date Initiated: 01/15/2018 Target Resolution Date: 03/22/2018 Goal Status: Active Interventions: Assess fall risk on admission and as needed Notes: ` Nutrition Nursing Diagnoses: Potential for alteratiion in Nutrition/Potential for imbalanced nutrition Goals: Patient/caregiver agrees to and verbalizes understanding of need to use nutritional supplements and/or vitamins as prescribed Date Initiated: 01/15/2018 Target Resolution Date: 03/22/2018 Goal Status:  Active Interventions: Assess patient nutrition upon admission and as needed per policy Notes: ` Orientation to the Wound Care Program Nursing Diagnoses: Knowledge deficit related to the wound healing center program Goals: Patient/caregiver will verbalize understanding of the [place] Program Date Initiated: 01/15/2018 Target Resolution Date: 03/22/2018 Goal Status: Active Interventions: Glenn Reeves, Glenn A. ([id number]) Provide education on orientation to the wound center Notes: ` Wound/Skin Impairment Nursing Diagnoses: Impaired tissue integrity Goals: Ulcer/skin breakdown will heal within 14 weeks Date Initiated: 01/15/2018 Target Resolution Date: 03/22/2018 Goal Status: Active Interventions: Assess patient/caregiver ability to obtain necessary supplies Assess patient/caregiver ability to perform ulcer/skin care regimen upon admission and as needed Assess ulceration(s) every visit Notes: Electronic Signature(s) Signed: 01/16/2018 5:04:30 PM By: Montey Hora Entered By: Montey Hora on 01/15/2018 11:11:00 Glenn Bras A. ([id number]) -------------------------------------------------------------------------------- Pain Assessment Details Patient Name: Glenn Bras A. Date of Service: 01/15/2018 10:30 AM Medical Record Number: [id number] Patient Account Number: [id number] Date of Birth/Sex: [birth year] (84 y.o. Male) Treating RN: Montey Hora Primary Care Pankaj Haack: Ramonita Lab Other Clinician: Referring Valena Ivanov: Earlie Server Treating Angelise Petrich/Extender: Melburn Hake, HOYT Weeks in Treatment: 0 Active Problems Location of Pain Severity and Description of Pain Patient Has Paino No Site Locations Pain Management and Medication Current Pain Management: Electronic Signature(s) Signed: 01/15/2018 4:26:07 PM By: Lorine Bears RCP, RRT, CHT Signed: 01/16/2018 5:04:30 PM By: Montey Hora Entered By: Lorine Bears on 01/15/2018  10:37:49 Glenn Reeves ([id number]) -------------------------------------------------------------------------------- Patient/Caregiver Education Details Patient Name: Glenn Bras A. Date of Service: 01/15/2018 10:30 AM Medical Record Number: [id number] Patient Account Number: [id number] Date of Birth/Gender: Jul 03, 1930 (84 y.o. Male) Treating RN: Montey Hora Primary Care Physician: Ramonita Lab Other Clinician: Referring Physician: Earlie Server Treating Physician/Extender: Sharalyn Ink in Treatment: 0 Education Assessment Education Provided To: Patient and Caregiver Education Topics Provided Welcome To [facility]: Handouts: Other: palliative wound care for malignant wound Methods: Explain/Verbal Responses: State content correctly Wound/Skin Impairment: Handouts: Other: wound care as ordered Methods: Demonstration, Explain/Verbal Responses: State content correctly Electronic Signature(s) Signed: 01/16/2018 5:04:30 PM By: Montey Hora Entered By: Montey Hora on 01/15/2018 11:13:14 Glenn Reeves, Glenn A. ([id number]) -------------------------------------------------------------------------------- Wound Assessment Details Patient Name: Glenn Bras A. Date of Service: 01/15/2018 10:30 AM Medical Record Number: [id number] Patient Account Number: [id number] Date of Birth/Sex: 23-Feb-1931 (84 y.o. Male) Treating RN: Roger Shelter Primary Care Shakera Ebrahimi: Ramonita Lab Other Clinician: Referring Mykael Trott: Earlie Server Treating Cleto Claggett/Extender: Melburn Hake, HOYT Weeks in Treatment: 0 Wound Status Wound Number: 1 Primary Malignant Wound Etiology: Wound Location: Left Head - Temporal Wound Open Wounding Event: Other Lesion Status: Date Acquired: 02/23/2017 Comorbid Cataracts, Chronic Obstructive Pulmonary Weeks Of Treatment: 0 History: Disease (COPD), Sleep Apnea, Arrhythmia, Clustered Wound: No Congestive Heart Failure, Hypertension, Type  II Diabetes Photos Photo Uploaded By: Gretta Cool, BSN, RN, CWS, Kim on  01/15/2018 14:02:44 Wound Measurements Length: (cm) 2.7 Width: (cm) 7.5 Depth: (cm) 0.2 Area: (cm) 15.904 Volume: (cm) 3.181 % Reduction in Area: % Reduction in Volume: Epithelialization: None Tunneling: No Undermining: No Wound Description Full Thickness Without Exposed Support Foul Odo Classification: Structures Slough/F Wound Margin: Indistinct, nonvisible Exudate Large Amount: Exudate Type: Serosanguineous Exudate Color: red, brown r After Cleansing: No ibrino Yes Wound Bed Granulation Amount: Medium (34-66%) Exposed Structure Granulation Quality: Red, Pink Fascia Exposed: No Necrotic Amount: Medium (34-66%) Fat Layer (Subcutaneous Tissue) Exposed: Yes Necrotic Quality: Eschar Tendon Exposed: No Muscle Exposed: No Joint Exposed: No Glenn Reeves, Glenn A. ([id number]) Bone Exposed: No Periwound Skin Texture Texture Color No Abnormalities Noted: No No Abnormalities Noted: No Callus: No Atrophie Blanche: No Crepitus: No Cyanosis: No Excoriation: No Ecchymosis: No Induration: No Erythema: No Rash: No Hemosiderin Staining: No Scarring: No Mottled: No Pallor: No Moisture Rubor: No No Abnormalities Noted: No Dry / Scaly: No Temperature / Pain Maceration: No Temperature: No Abnormality Wound Preparation Ulcer Cleansing: Rinsed/Irrigated with Saline Topical Anesthetic Applied: Other: lidocaine 4%, Electronic Signature(s) Signed: 01/15/2018 2:05:43 PM By: Roger Shelter Entered By: Roger Shelter on 01/15/2018 10:59:13 Glenn Reeves ([id number]) -------------------------------------------------------------------------------- Vitals Details Patient Name: Glenn Bras A. Date of Service: 01/15/2018 10:30 AM Medical Record Number: [id number] Patient Account Number: [id number] Date of Birth/Sex: 03/17/1931 (84 y.o. Male) Treating RN: Montey Hora Primary Care Shandy Vi:  Ramonita Lab Other Clinician: Referring Marilena Trevathan: Earlie Server Treating Tylah Mancillas/Extender: Melburn Hake, HOYT Weeks in Treatment: 0 Vital Signs Time Taken: 10:35 Temperature (F): 97.9 Height (in): 69 Pulse (bpm): 69 Source: Stated Respiratory Rate (breaths/min): 18 Weight (lbs): 166 Blood Pressure (mmHg): 103/53 Source: Stated Reference Range: 80 - 120 mg / dl Body Mass Index (BMI): 24.5 Electronic Signature(s) Signed: 01/15/2018 4:26:07 PM By: Lorine Bears RCP, RRT, CHT Entered By: Becky Sax, Amado Nash on 01/15/2018 10:41:29

## 2018-01-21 ENCOUNTER — Other Ambulatory Visit: Payer: Self-pay | Admitting: *Deleted

## 2018-01-21 DIAGNOSIS — C4492 Squamous cell carcinoma of skin, unspecified: Secondary | ICD-10-CM

## 2018-01-22 ENCOUNTER — Ambulatory Visit
Admission: RE | Admit: 2018-01-22 | Discharge: 2018-01-22 | Disposition: A | Discharge status: Home / Self Care | Payer: Medicare Other | Source: Ambulatory Visit | Attending: Radiation Oncology | Admitting: Radiation Oncology

## 2018-01-22 ENCOUNTER — Ambulatory Visit: Payer: Medicare Other | Admitting: Physician Assistant

## 2018-01-22 DIAGNOSIS — C4492 Squamous cell carcinoma of skin, unspecified: Secondary | ICD-10-CM | POA: Insufficient documentation

## 2018-01-22 DIAGNOSIS — Z510 Encounter for antineoplastic radiation therapy: Secondary | ICD-10-CM | POA: Insufficient documentation

## 2018-01-23 ENCOUNTER — Ambulatory Visit
Admission: RE | Admit: 2018-01-23 | Discharge: 2018-01-23 | Disposition: A | Discharge status: Home / Self Care | Payer: Medicare Other | Source: Ambulatory Visit | Attending: Radiation Oncology | Admitting: Radiation Oncology

## 2018-01-23 DIAGNOSIS — Z510 Encounter for antineoplastic radiation therapy: Secondary | ICD-10-CM | POA: Diagnosis present

## 2018-01-23 DIAGNOSIS — C4492 Squamous cell carcinoma of skin, unspecified: Secondary | ICD-10-CM | POA: Diagnosis not present

## 2018-01-24 ENCOUNTER — Ambulatory Visit
Admission: RE | Admit: 2018-01-24 | Discharge: 2018-01-24 | Disposition: A | Discharge status: Home / Self Care | Payer: Medicare Other | Source: Ambulatory Visit | Attending: Radiation Oncology | Admitting: Radiation Oncology

## 2018-01-24 DIAGNOSIS — Z510 Encounter for antineoplastic radiation therapy: Secondary | ICD-10-CM | POA: Diagnosis not present

## 2018-01-25 ENCOUNTER — Ambulatory Visit
Admission: RE | Admit: 2018-01-25 | Discharge: 2018-01-25 | Disposition: A | Discharge status: Home / Self Care | Payer: Medicare Other | Source: Ambulatory Visit | Attending: Radiation Oncology | Admitting: Radiation Oncology

## 2018-01-25 DIAGNOSIS — Z510 Encounter for antineoplastic radiation therapy: Secondary | ICD-10-CM | POA: Diagnosis not present

## 2018-01-28 ENCOUNTER — Ambulatory Visit
Admission: RE | Admit: 2018-01-28 | Discharge: 2018-01-28 | Disposition: A | Discharge status: Home / Self Care | Payer: Medicare Other | Source: Ambulatory Visit | Attending: Radiation Oncology | Admitting: Radiation Oncology

## 2018-01-28 DIAGNOSIS — Z510 Encounter for antineoplastic radiation therapy: Secondary | ICD-10-CM | POA: Diagnosis not present

## 2018-01-29 ENCOUNTER — Ambulatory Visit
Admission: RE | Admit: 2018-01-29 | Discharge: 2018-01-29 | Disposition: A | Discharge status: Home / Self Care | Payer: Medicare Other | Source: Ambulatory Visit | Attending: Radiation Oncology | Admitting: Radiation Oncology

## 2018-01-29 ENCOUNTER — Inpatient Hospital Stay: Payer: Medicare Other | Attending: Oncology

## 2018-01-29 DIAGNOSIS — Z79899 Other long term (current) drug therapy: Secondary | ICD-10-CM | POA: Insufficient documentation

## 2018-01-29 DIAGNOSIS — Z87891 Personal history of nicotine dependence: Secondary | ICD-10-CM | POA: Diagnosis not present

## 2018-01-29 DIAGNOSIS — R531 Weakness: Secondary | ICD-10-CM | POA: Insufficient documentation

## 2018-01-29 DIAGNOSIS — Z510 Encounter for antineoplastic radiation therapy: Secondary | ICD-10-CM | POA: Diagnosis not present

## 2018-01-29 DIAGNOSIS — E119 Type 2 diabetes mellitus without complications: Secondary | ICD-10-CM | POA: Diagnosis not present

## 2018-01-29 DIAGNOSIS — I2510 Atherosclerotic heart disease of native coronary artery without angina pectoris: Secondary | ICD-10-CM | POA: Diagnosis not present

## 2018-01-29 DIAGNOSIS — J8410 Pulmonary fibrosis, unspecified: Secondary | ICD-10-CM | POA: Diagnosis not present

## 2018-01-29 DIAGNOSIS — I4891 Unspecified atrial fibrillation: Secondary | ICD-10-CM | POA: Diagnosis not present

## 2018-01-29 DIAGNOSIS — C4492 Squamous cell carcinoma of skin, unspecified: Secondary | ICD-10-CM | POA: Diagnosis present

## 2018-01-29 DIAGNOSIS — I10 Essential (primary) hypertension: Secondary | ICD-10-CM | POA: Insufficient documentation

## 2018-01-29 DIAGNOSIS — E079 Disorder of thyroid, unspecified: Secondary | ICD-10-CM | POA: Insufficient documentation

## 2018-01-29 DIAGNOSIS — R634 Abnormal weight loss: Secondary | ICD-10-CM | POA: Insufficient documentation

## 2018-01-29 LAB — CBC
HCT: 35.3 % — ABNORMAL LOW (ref 40.0–52.0)
HEMOGLOBIN: 11.7 g/dL — AB (ref 13.0–18.0)
MCH: 29.2 pg (ref 26.0–34.0)
MCHC: 33.2 g/dL (ref 32.0–36.0)
MCV: 88 fL (ref 80.0–100.0)
Platelets: 210 10*3/uL (ref 150–440)
RBC: 4 MIL/uL — AB (ref 4.40–5.90)
RDW: 15.6 % — ABNORMAL HIGH (ref 11.5–14.5)
WBC: 12.4 10*3/uL — AB (ref 3.8–10.6)

## 2018-01-30 ENCOUNTER — Ambulatory Visit
Admission: RE | Admit: 2018-01-30 | Discharge: 2018-01-30 | Disposition: A | Discharge status: Home / Self Care | Payer: Medicare Other | Source: Ambulatory Visit | Attending: Radiation Oncology | Admitting: Radiation Oncology

## 2018-01-30 DIAGNOSIS — Z510 Encounter for antineoplastic radiation therapy: Secondary | ICD-10-CM | POA: Diagnosis not present

## 2018-01-31 ENCOUNTER — Ambulatory Visit
Admission: RE | Admit: 2018-01-31 | Discharge: 2018-01-31 | Disposition: A | Discharge status: Home / Self Care | Payer: Medicare Other | Source: Ambulatory Visit | Attending: Radiation Oncology | Admitting: Radiation Oncology

## 2018-01-31 DIAGNOSIS — Z510 Encounter for antineoplastic radiation therapy: Secondary | ICD-10-CM | POA: Diagnosis not present

## 2018-02-01 ENCOUNTER — Ambulatory Visit
Admission: RE | Admit: 2018-02-01 | Discharge: 2018-02-01 | Disposition: A | Discharge status: Home / Self Care | Payer: Medicare Other | Source: Ambulatory Visit | Attending: Radiation Oncology | Admitting: Radiation Oncology

## 2018-02-01 DIAGNOSIS — Z510 Encounter for antineoplastic radiation therapy: Secondary | ICD-10-CM | POA: Diagnosis not present

## 2018-02-04 ENCOUNTER — Ambulatory Visit
Admission: RE | Admit: 2018-02-04 | Discharge: 2018-02-04 | Disposition: A | Discharge status: Home / Self Care | Payer: Medicare Other | Source: Ambulatory Visit | Attending: Radiation Oncology | Admitting: Radiation Oncology

## 2018-02-04 DIAGNOSIS — Z510 Encounter for antineoplastic radiation therapy: Secondary | ICD-10-CM | POA: Diagnosis not present

## 2018-02-05 ENCOUNTER — Inpatient Hospital Stay: Payer: Medicare Other

## 2018-02-05 ENCOUNTER — Ambulatory Visit
Admission: RE | Admit: 2018-02-05 | Discharge: 2018-02-05 | Disposition: A | Discharge status: Home / Self Care | Payer: Medicare Other | Source: Ambulatory Visit | Attending: Radiation Oncology | Admitting: Radiation Oncology

## 2018-02-05 DIAGNOSIS — Z510 Encounter for antineoplastic radiation therapy: Secondary | ICD-10-CM | POA: Diagnosis not present

## 2018-02-05 DIAGNOSIS — C4492 Squamous cell carcinoma of skin, unspecified: Secondary | ICD-10-CM | POA: Diagnosis not present

## 2018-02-05 LAB — CBC
HEMATOCRIT: 37.6 % — AB (ref 39.0–52.0)
Hemoglobin: 11.5 g/dL — ABNORMAL LOW (ref 13.0–17.0)
MCH: 27.8 pg (ref 26.0–34.0)
MCHC: 30.6 g/dL (ref 30.0–36.0)
MCV: 90.8 fL (ref 80.0–100.0)
NRBC: 0 % (ref 0.0–0.2)
PLATELETS: 225 10*3/uL (ref 150–400)
RBC: 4.14 MIL/uL — ABNORMAL LOW (ref 4.22–5.81)
RDW: 15 % (ref 11.5–15.5)
WBC: 9.3 10*3/uL (ref 4.0–10.5)

## 2018-02-06 ENCOUNTER — Ambulatory Visit: Payer: Medicare Other | Admitting: Internal Medicine

## 2018-02-06 ENCOUNTER — Ambulatory Visit
Admission: RE | Admit: 2018-02-06 | Discharge: 2018-02-06 | Disposition: A | Discharge status: Home / Self Care | Payer: Medicare Other | Source: Ambulatory Visit | Attending: Radiation Oncology | Admitting: Radiation Oncology

## 2018-02-06 DIAGNOSIS — Z510 Encounter for antineoplastic radiation therapy: Secondary | ICD-10-CM | POA: Diagnosis not present

## 2018-02-07 ENCOUNTER — Ambulatory Visit
Admission: RE | Admit: 2018-02-07 | Discharge: 2018-02-07 | Disposition: A | Discharge status: Home / Self Care | Payer: Medicare Other | Source: Ambulatory Visit | Attending: Radiation Oncology | Admitting: Radiation Oncology

## 2018-02-07 DIAGNOSIS — Z510 Encounter for antineoplastic radiation therapy: Secondary | ICD-10-CM | POA: Diagnosis not present

## 2018-02-08 ENCOUNTER — Ambulatory Visit
Admission: RE | Admit: 2018-02-08 | Discharge: 2018-02-08 | Disposition: A | Discharge status: Home / Self Care | Payer: Medicare Other | Source: Ambulatory Visit | Attending: Radiation Oncology | Admitting: Radiation Oncology

## 2018-02-08 DIAGNOSIS — Z510 Encounter for antineoplastic radiation therapy: Secondary | ICD-10-CM | POA: Diagnosis not present

## 2018-02-11 ENCOUNTER — Ambulatory Visit
Admission: RE | Admit: 2018-02-11 | Discharge: 2018-02-11 | Disposition: A | Discharge status: Home / Self Care | Payer: Medicare Other | Source: Ambulatory Visit | Attending: Radiation Oncology | Admitting: Radiation Oncology

## 2018-02-11 DIAGNOSIS — Z510 Encounter for antineoplastic radiation therapy: Secondary | ICD-10-CM | POA: Diagnosis not present

## 2018-02-12 ENCOUNTER — Encounter: Payer: Medicare Other | Attending: Physician Assistant | Admitting: Physician Assistant

## 2018-02-12 ENCOUNTER — Inpatient Hospital Stay: Payer: Medicare Other

## 2018-02-12 ENCOUNTER — Ambulatory Visit
Admission: RE | Admit: 2018-02-12 | Discharge: 2018-02-12 | Disposition: A | Discharge status: Home / Self Care | Payer: Medicare Other | Source: Ambulatory Visit | Attending: Radiation Oncology | Admitting: Radiation Oncology

## 2018-02-12 DIAGNOSIS — Z823 Family history of stroke: Secondary | ICD-10-CM | POA: Diagnosis not present

## 2018-02-12 DIAGNOSIS — Z87891 Personal history of nicotine dependence: Secondary | ICD-10-CM | POA: Insufficient documentation

## 2018-02-12 DIAGNOSIS — I4891 Unspecified atrial fibrillation: Secondary | ICD-10-CM | POA: Diagnosis not present

## 2018-02-12 DIAGNOSIS — L89890 Pressure ulcer of other site, unstageable: Secondary | ICD-10-CM | POA: Insufficient documentation

## 2018-02-12 DIAGNOSIS — Z8249 Family history of ischemic heart disease and other diseases of the circulatory system: Secondary | ICD-10-CM | POA: Insufficient documentation

## 2018-02-12 DIAGNOSIS — C4492 Squamous cell carcinoma of skin, unspecified: Secondary | ICD-10-CM | POA: Diagnosis not present

## 2018-02-12 DIAGNOSIS — G4730 Sleep apnea, unspecified: Secondary | ICD-10-CM | POA: Diagnosis not present

## 2018-02-12 DIAGNOSIS — L98492 Non-pressure chronic ulcer of skin of other sites with fat layer exposed: Secondary | ICD-10-CM | POA: Insufficient documentation

## 2018-02-12 DIAGNOSIS — I509 Heart failure, unspecified: Secondary | ICD-10-CM | POA: Insufficient documentation

## 2018-02-12 DIAGNOSIS — Z510 Encounter for antineoplastic radiation therapy: Secondary | ICD-10-CM | POA: Diagnosis not present

## 2018-02-12 DIAGNOSIS — E11622 Type 2 diabetes mellitus with other skin ulcer: Secondary | ICD-10-CM | POA: Insufficient documentation

## 2018-02-12 DIAGNOSIS — I110 Hypertensive heart disease with heart failure: Secondary | ICD-10-CM | POA: Diagnosis not present

## 2018-02-12 DIAGNOSIS — J449 Chronic obstructive pulmonary disease, unspecified: Secondary | ICD-10-CM | POA: Diagnosis not present

## 2018-02-12 LAB — CBC
HEMATOCRIT: 38.3 % — AB (ref 39.0–52.0)
Hemoglobin: 11.7 g/dL — ABNORMAL LOW (ref 13.0–17.0)
MCH: 27.9 pg (ref 26.0–34.0)
MCHC: 30.5 g/dL (ref 30.0–36.0)
MCV: 91.2 fL (ref 80.0–100.0)
NRBC: 0 % (ref 0.0–0.2)
PLATELETS: 242 10*3/uL (ref 150–400)
RBC: 4.2 MIL/uL — AB (ref 4.22–5.81)
RDW: 15.4 % (ref 11.5–15.5)
WBC: 9.9 10*3/uL (ref 4.0–10.5)

## 2018-02-13 ENCOUNTER — Ambulatory Visit
Admission: RE | Admit: 2018-02-13 | Discharge: 2018-02-13 | Disposition: A | Discharge status: Home / Self Care | Payer: Medicare Other | Source: Ambulatory Visit | Attending: Radiation Oncology | Admitting: Radiation Oncology

## 2018-02-13 DIAGNOSIS — Z510 Encounter for antineoplastic radiation therapy: Secondary | ICD-10-CM | POA: Diagnosis not present

## 2018-02-14 ENCOUNTER — Ambulatory Visit
Admission: RE | Admit: 2018-02-14 | Discharge: 2018-02-14 | Disposition: A | Discharge status: Home / Self Care | Payer: Medicare Other | Source: Ambulatory Visit | Attending: Radiation Oncology | Admitting: Radiation Oncology

## 2018-02-14 DIAGNOSIS — Z510 Encounter for antineoplastic radiation therapy: Secondary | ICD-10-CM | POA: Diagnosis not present

## 2018-02-15 ENCOUNTER — Other Ambulatory Visit: Payer: Self-pay

## 2018-02-15 ENCOUNTER — Ambulatory Visit
Admission: RE | Admit: 2018-02-15 | Discharge: 2018-02-15 | Disposition: A | Discharge status: Home / Self Care | Payer: Medicare Other | Source: Ambulatory Visit | Attending: Radiation Oncology | Admitting: Radiation Oncology

## 2018-02-15 ENCOUNTER — Encounter: Payer: Self-pay | Admitting: Oncology

## 2018-02-15 ENCOUNTER — Inpatient Hospital Stay (HOSPITAL_BASED_OUTPATIENT_CLINIC_OR_DEPARTMENT_OTHER): Payer: Medicare Other | Admitting: Oncology

## 2018-02-15 VITALS — BP 105/59 | HR 75 | Temp 96.4°F | Resp 18 | Wt 170.5 lb

## 2018-02-15 DIAGNOSIS — I4891 Unspecified atrial fibrillation: Secondary | ICD-10-CM

## 2018-02-15 DIAGNOSIS — Z7189 Other specified counseling: Secondary | ICD-10-CM

## 2018-02-15 DIAGNOSIS — R634 Abnormal weight loss: Secondary | ICD-10-CM

## 2018-02-15 DIAGNOSIS — I2510 Atherosclerotic heart disease of native coronary artery without angina pectoris: Secondary | ICD-10-CM

## 2018-02-15 DIAGNOSIS — Z79899 Other long term (current) drug therapy: Secondary | ICD-10-CM

## 2018-02-15 DIAGNOSIS — Z87891 Personal history of nicotine dependence: Secondary | ICD-10-CM

## 2018-02-15 DIAGNOSIS — E079 Disorder of thyroid, unspecified: Secondary | ICD-10-CM

## 2018-02-15 DIAGNOSIS — E119 Type 2 diabetes mellitus without complications: Secondary | ICD-10-CM

## 2018-02-15 DIAGNOSIS — R5383 Other fatigue: Secondary | ICD-10-CM

## 2018-02-15 DIAGNOSIS — C4492 Squamous cell carcinoma of skin, unspecified: Secondary | ICD-10-CM | POA: Diagnosis not present

## 2018-02-15 DIAGNOSIS — R531 Weakness: Secondary | ICD-10-CM | POA: Diagnosis not present

## 2018-02-15 DIAGNOSIS — J8410 Pulmonary fibrosis, unspecified: Secondary | ICD-10-CM | POA: Diagnosis not present

## 2018-02-15 DIAGNOSIS — Z510 Encounter for antineoplastic radiation therapy: Secondary | ICD-10-CM | POA: Diagnosis not present

## 2018-02-15 DIAGNOSIS — I10 Essential (primary) hypertension: Secondary | ICD-10-CM

## 2018-02-15 NOTE — Progress Notes (Signed)
Patient here for follow up. Pt not feeling well today, very tired. Blood sugars have been low, he has not taken insulin in about a week.

## 2018-02-15 NOTE — Progress Notes (Signed)
Hematology/Oncology Consult note [facility] [phone] [phone]   Patient Care Team: Adin Hector, MD as PCP - General (Internal Medicine)  REFERRING PROVIDER: Adin Hector, MD CHIEF COMPLAINTS/REASON FOR VISIT:  Evaluation of squamous cell carcinoma of the skin  HISTORY OF PRESENTING ILLNESS:  Glenn Reeves is a  84 y.o.  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  He had multiple skin resection procedures for SCC removal of left ear, multiple area on face, left scalp and left forehead.  I do not have his previous medical records from [place]. Reviewed patient's medical records in [facility] via careeverywhere.  He previously underwent resection of squamous cell carcinoma of left forehead in [place], deep margin of the left temporal wound was reported positive.  He was seen and evaluated by Dr.Puscas in [facility] in January 2019. Treatment options ie Mohs vs wide local excision were discussed at that time and decision was made to proceed with wide local resection of left temple SCC and wedge resection of left lowe lip SCC. However his surgery was put off due to weather and ultimately patient elected not to follow through with it.   Recently he presented to ER for evaluation after a fall. CT head and maxillofacial was done which revealed no acute traumatic abnormaltiy however showed large left frontal scalp and calvarium mass measuring up to 8.8cm x 3.5cm with extension erosion of the left frontal calcarium, left frontal sinus, left maxillary process, and superior and lateral walled of the left orbit.   Patient also has lost 10 pounds weight, feels very weak. Accompanied by his daughter who he lives with.  Chronic history of lung fibrosis, HTN, DM, CAD, Atrial fibrillation, hearing impairment.   # Case was presented on tumor  board on 01/03/2018. Consensus was to do palliative RT.   Data Reviewed 04/10/2018 CT neck soft tissue w contrast No metastatic disease or lymphadenopathy in the parotid glands. Pathology consultation at [facility] # A. Outside case, [id number], [facility] [place], [place]. Date of procedure 01-31-17: 1. Left central forehead, shave removal and destruction: SQUAMOUS CELL CARCINOMA, INVASIVE, BIOPSY. 2. Left central forehead, shave removal and destruction: SQUAMOUS CELL CARCINOMA, INVASIVE, BIOPSY. SEE NOTE. NOTE: Sections show proliferation of atypical keratinocytes infiltrating in the dermis. Per outside report the atypical cells are positive for AE1/AE3 and P63. S100 and Melan A stains are non contributory. 3. Left inferior vermilion lip, shave removal and destruction: SQUAMOUS CELL CARCINOMA, INVASIVE, BIOPSY.  INTERVAL HISTORY Glenn Reeves is a 85 y.o. male who has above history reviewed by me today presents for follow up visit for management of locally advanced squamous cell carcinoma of the skin.  He was last seen by me 01/02/2018. Patient and daughter was interested in palliative radiation and he was referred to [place].  Patient has started palliative RT and will finish his radiation course on 02/19/2018.  Patient report no pain, feeling weak. Denies any fever or chills. Denies dizziness.   Appetite is not great.    Review of Systems  Constitutional: Positive for malaise/fatigue and weight loss. Negative for chills and fever.  HENT: Positive for hearing loss. Negative for nosebleeds and sore throat.   Eyes: Negative for double vision, photophobia and redness.       Impaired left eye vision.  Respiratory: Negative for cough, shortness of breath and  wheezing.   Cardiovascular: Negative for chest pain, palpitations and orthopnea.  Gastrointestinal: Negative for abdominal pain, blood in stool, nausea and vomiting.  Genitourinary: Negative for dysuria.    Musculoskeletal: Negative for back pain, myalgias and neck pain.  Skin: Negative for itching and rash.       Chronic left termproal/left eye skin lesion.   Neurological: Negative for dizziness, tingling, tremors and headaches.  Endo/Heme/Allergies: Negative for environmental allergies. Does not bruise/bleed easily.  Psychiatric/Behavioral: Negative for depression and hallucinations.    MEDICAL HISTORY:  Past Medical History:  Diagnosis Date  . Atrial fibrillation ([place])   . CAD (coronary artery disease)   . Cancer (HCC)    squamous cell - left ear  . Diabetes mellitus without complication ([place])   . GI bleed   . Hypertension   . Pulmonary fibrosis ([place])   . Thyroid disease     SURGICAL HISTORY: Past Surgical History:  Procedure Laterality Date  . ESOPHAGOGASTRODUODENOSCOPY (EGD) WITH PROPOFOL N/A 06/29/2016   Procedure: ESOPHAGOGASTRODUODENOSCOPY (EGD) WITH PROPOFOL;  Surgeon: [name], MD;  Location: [facility] ENDOSCOPY;  Service: Endoscopy;  Laterality: N/A;    SOCIAL HISTORY: Social History   Socioeconomic History  . Marital status: Widowed    Spouse name: Not on file  . Number of children: Not on file  . Years of education: Not on file  . Highest education level: Not on file  Occupational History  . Not on file  Social Needs  . Financial resource strain: Not on file  . Food insecurity:    Worry: Not on file    Inability: Not on file  . Transportation needs:    Medical: Not on file    Non-medical: Not on file  Tobacco Use  . Smoking status: Former Smoker    Types: Cigarettes    Last attempt to quit: 01/03/1988    Years since quitting: 30.1  . Smokeless tobacco: Never Used  . Tobacco comment: smoked in the winter  Substance and Sexual Activity  . Alcohol use: No  . Drug use: No  . Sexual activity: Not on file  Lifestyle  . Physical activity:    Days per week: Not on file    Minutes per session: Not on file  . Stress: Not on file  Relationships  . Social  connections:    Talks on phone: Not on file    Gets together: Not on file    Attends religious service: Not on file    Active member of club or organization: Not on file    Attends meetings of clubs or organizations: Not on file    Relationship status: Not on file  . Intimate partner violence:    Fear of current or ex partner: Not on file    Emotionally abused: Not on file    Physically abused: Not on file    Forced sexual activity: Not on file  Other Topics Concern  . Not on file  Social History Narrative  . Not on file    FAMILY HISTORY: Family History  Problem Relation Age of Onset  . Heart attack Mother   . Heart attack Father   . Heart disease Brother   . Stroke Brother   . Autoimmune disease Neg Hx     ALLERGIES:  is allergic to codeine.  MEDICATIONS:  Current Outpatient Medications  Medication Sig Dispense Refill  . carvedilol (COREG) 6.25 MG tablet Take 6.25 mg by mouth 2 (two) times daily.    . finasteride (  PROSCAR) 5 MG tablet Take 5 mg by mouth daily.    . furosemide (LASIX) 20 MG tablet Take 20 mg by mouth every other day.     . ipratropium-albuterol (DUONEB) 0.5-2.5 (3) MG/3ML SOLN Inhale 3 mLs into the lungs every evening.     Marland Kitchen levothyroxine (SYNTHROID, LEVOTHROID) 75 MCG tablet Take 75 mcg by mouth daily.    . montelukast (SINGULAIR) 10 MG tablet Take 1 tablet by mouth daily.    . pantoprazole (PROTONIX) 40 MG tablet Take 40 mg by mouth daily.    . polycarbophil (FIBERCON) 625 MG tablet Take 625 mg by mouth daily.     . rosuvastatin (CRESTOR) 20 MG tablet Take 10 mg by mouth every evening.     . tamsulosin (FLOMAX) 0.4 MG CAPS capsule Take 0.4 mg by mouth daily.     Marland Kitchen HUMALOG KWIKPEN 100 UNIT/ML KiwkPen Inject 12 Units into the skin See admin instructions. Inject 12u under the skin with meals as directed for blood sugar over 150    . LEVEMIR FLEXTOUCH 100 UNIT/ML Pen Inject 5 Units into the skin at bedtime.      No current facility-administered  medications for this visit.      PHYSICAL EXAMINATION: ECOG PERFORMANCE STATUS: 3 - Symptomatic, >50% confined to bed Vitals:   02/15/18 1034  BP: (!) 105/59  Pulse: 75  Resp: 18  Temp: (!) 96.4 F (35.8 C)  SpO2: 96%   Filed Weights   02/15/18 1034  Weight: 170 lb 8 oz (77.3 kg)    Physical Exam  Constitutional: He is oriented to person, place, and time. No distress.  HENT:  Head: Normocephalic and atraumatic.  Mouth/Throat: Oropharynx is clear and moist.  Post surgical changes of left ear.  left forehead and temporal scalp open wound.   Eyes: Pupils are equal, round, and reactive to light. EOM are normal. No scleral icterus.  periorbital edema with ptosis of the eye on the left.  Neck: Normal range of motion. Neck supple.  Cardiovascular: Normal rate, regular rhythm and normal heart sounds.  Pulmonary/Chest: Effort normal. No respiratory distress. He has no wheezes.  Abdominal: Soft. Bowel sounds are normal. He exhibits no distension and no mass. There is no tenderness.  Musculoskeletal: Normal range of motion. He exhibits no edema or deformity.  Neurological: He is alert and oriented to person, place, and time. No cranial nerve deficit. Coordination normal.  Skin: Skin is warm and dry. No rash noted. No erythema.  Psychiatric: He has a normal mood and affect. His behavior is normal. Thought content normal.       LABORATORY DATA:  I have reviewed the data as listed Lab Results  Component Value Date   WBC 9.9 02/12/2018   HGB 11.7 (L) 02/12/2018   HCT 38.3 (L) 02/12/2018   MCV 91.2 02/12/2018   PLT 242 02/12/2018   Recent Labs    12/26/17 1732  NA 140  K 4.3  CL 103  CO2 30  GLUCOSE 187*  BUN 24*  CREATININE 1.40*  CALCIUM 9.2  GFRNONAA 44*  GFRAA 51*  PROT 7.0  ALBUMIN 2.7*  AST 19  ALT 14  ALKPHOS 49  BILITOT 0.5   Iron/TIBC/Ferritin/ %Sat No results found for: IRON, TIBC, FERRITIN, IRONPCTSAT   RADIOGRAPHIC STUDIES: I have personally  reviewed the radiological images as listed and agreed with the findings in the report. 12/26/2017 CT head wo contrast and CT maxillofacial wo contrast.  1. No acute traumatic intracranial abnormality or  maxillofacial fracture. 2. Large left frontal scalp and calvarium mass measuring up to 8.8 x 3.5 cm with extensive erosion of the left frontal calvarium, left frontal sinus, left maxillary process and the superior and lateral walls of the left orbit. The primary consideration is an invasive skin carcinoma. Other possibilities include orbital lymphoma (though this is unusually aggressive), a primary osseous tumor or metastasis from an unknown primary cancer; but these are all less likely. 3. Central hypoattenuation within the mass is most likely necrotic tumor. However, given the proximity of the dura, this could represent a contained CSF leak. There is inward bulging of the left frontal convexity dura, but no evidence of intradural extension of tumor.  ASSESSMENT & PLAN:  1. Squamous cell carcinoma of skin   2. Goals of care, counseling/discussion   3. Other fatigue   locally advanced squamous cell cacinoma with orbit and maxillary sinus invasion.  To complete palliative RT course. Tolerates well, no pain.  Weakness and fatigue likely due to his multiple comorbidities and advanced age. Recent lab reviewed.  Hemoglobin stable. Normal wbc and platelet count.   Continue wound care. Patient has home health service.  PET scan was independently reviewed and discussed with patient and daughter. No evidence for hypermetabolic metastatic disease in the neck, chest, abdomen, or pelvis # Goal of care was discussed.  Patient and daughter is not interested in any systemic treatment. Radiation is with palliative intent. Supportive care measures are necessary for patient well-being and will be provided as necessary. We discussed about involving palliative care service and daughter wants to hold off referral  and discuss in the in future.    Orders Placed This Encounter  Procedures  . CBC with Differential/Platelet    Standing Status:   Future    Standing Expiration Date:   02/15/2019    All questions were answered. The patient knows to call the clinic with any problems questions or concerns.  Return of visit: 4 weeks. Total face to face encounter time for this patient visit was 49min. >50% of the time was  spent in counseling and coordination of care.     Earlie Server, MD, PhD Hematology Oncology [facility] Pager- [phone] 02/15/2018

## 2018-02-15 NOTE — Progress Notes (Addendum)
LYRIK, BURESH ([id number]) Visit Report for 02/12/2018 Chief Complaint Document Details Patient Name: Glenn Reeves, Glenn A. Date of Service: 02/12/2018 10:15 AM Medical Record Number: [id number] Patient Account Number: [id number] Date of Birth/Sex: 1931/02/14 (84 y.o. M) Treating RN: Montey Hora Primary Care Provider: Ramonita Lab Other Clinician: Referring Provider: Ramonita Lab Treating Provider/Extender: Melburn Hake, HOYT Weeks in Treatment: 4 Information Obtained from: Patient Chief Complaint Left temple malignant ulcer Electronic Signature(s) Signed: 02/14/2018 1:45:00 AM By: Worthy Keeler PA-C Entered By: Worthy Keeler on 02/12/2018 10:53:34 Kugler, Germain A. ([id number]) -------------------------------------------------------------------------------- HPI Details Patient Name: Glenn Bras A. Date of Service: 02/12/2018 10:15 AM Medical Record Number: [id number] Patient Account Number: [id number] Date of Birth/Sex: 08-08-1930 (84 y.o. 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 The patient actually went to have a Mohs surgery what sounds to be somewhere between 2-3 years ago. Nonetheless during the most surgery the surgeon/pathologist stated that he could not really clear the margins and felt that a plastic surgery referral may be necessary. Subsequently the patient was actually eventually referred to [facility] for plastic surgery to remove  the carcinoma and at that point there was no bone involvement according to the patient's daughter. Nonetheless due to several extenuating circumstances initially the surgery was postponed about a surgeon due to some conflict secondarily to that it was again postponed due to the patient being ill during the time the surgery was put supposed to be next performed. Nonetheless in the end the patient opted to forgo any surgery whatsoever following and did not really have reevaluation until 12/26/17 when he did have bone involvement. The CT scan reads that the patient has a large left frontal scalp mass measuring up to 8.8 x 3.5 cm with extensive erosion of the left frontal calvarium, left frontal sinus, left maxillary process, in the superior and lateral walls of the left orbit. This appears to be a invasive skin carcinoma. Unfortunately at this time the decision has been made to proceed with radiation therapy as a palliative process not curative currently. Basically the patient comes in today by way of his daughter who is his primary caretaker at this point in order for [place] to recommend options as far as dressings are concerned to help manage this in a palliative manner patient's hemoglobin A1c when last taken was 6.6 and this was on 12/21/17. Currently the patient does have pain at the site of the cancer and there is a definite open wound at the site as well which does drain although his daughter states it does not seem to drain to significantly. There is no evidence of infection at this time. 02/12/18 on evaluation today patient appears to be doing overall about the same in regard to the left temple ulcer. This again is a cancerous/malignant wound. Nonetheless it is a little bit larger he has been undergoing radiation therapy which is likely part of the reason for this. Nonetheless it does not look bad. A silver contact layer has been utilized at this point by home health which  I think is definitely  appropriate it does look like it's doing a good job. As far as the overall appearance of the wound otherwise I'm seeing no evidence of hyper granular tissue apparently Hydrofera Blue Dressing mentioned as a possibility I'm not even sure that would be necessary. I do think we need to try to avoid tape to the area as much is possible due to the fact that it seems to be causing some skin tearing. He does have a new wound on the posterior portion of his neck which is actually due to a pressure area where I believe his BiPAP machine is actually pushing on back of his neck too tightly. This is painful today. Electronic Signature(s) Signed: 02/14/2018 1:45:00 AM By: Worthy Keeler PA-C Entered By: Worthy Keeler on 02/12/2018 11:09:10 Glenn Reeves ([id number]) -------------------------------------------------------------------------------- Physical Exam Details Patient Name: Glenn Bras A. Date of Service: 02/12/2018 10:15 AM Medical Record Number: [id number] Patient Account Number: [id number] Date of Birth/Sex: 01-05-1931 (84 y.o. M) Treating RN: Montey Hora Primary Care Provider: Ramonita Lab Other Clinician: Referring Provider: Ramonita Lab Treating Provider/Extender: STONE III, HOYT Weeks in Treatment: 4 Constitutional Well-nourished and well-hydrated in no acute distress. Respiratory normal breathing without difficulty. clear to auscultation bilaterally. Cardiovascular regular rate and rhythm with normal S1, S2. Psychiatric Patient is not able to cooperate in decision making regarding care. Patient is oriented to person only. pleasant and cooperative. Notes Patient's wound bed currently on the left temple actually appears to be doing decently well at this time. There does not appear to be any evidence of infection at this time. He is undergoing radiation therapy there's definitely some skin changes/irritation surrounding this area likely due to the radiation. Nonetheless  in regard to the new wound on the posterior portion the neck this really should have some sharp debridement performed. With that being said the patient actually has discomfort even with light touch to the area he really was not wanting me to do anything further as far as debridement was concerned therefore debridement was avoided today. Electronic Signature(s) Signed: 02/14/2018 1:45:00 AM By: Worthy Keeler PA-C Entered By: Worthy Keeler on 02/12/2018 11:10:10 Glenn Reeves ([id number]) -------------------------------------------------------------------------------- Physician Orders Details Patient Name: Glenn Bras A. Date of Service: 02/12/2018 10:15 AM Medical Record Number: [id number] Patient Account Number: [id number] Date of Birth/Sex: Nov 05, 1930 (84 y.o. M) Treating RN: Cornell Barman Primary Care Provider: Ramonita Lab Other Clinician: Referring Provider: Ramonita Lab Treating Provider/Extender: Melburn Hake, HOYT Weeks in Treatment: 4 Verbal / Phone Orders: No Diagnosis Coding ICD-10 Coding Code Description C44.92 Squamous cell carcinoma of skin, unspecified L98.492 Non-pressure chronic ulcer of skin of other sites with fat layer exposed E11.622 Type 2 diabetes mellitus with other skin ulcer I10 Essential (primary) hypertension Wound Cleansing Wound #1 Left Head - Temporal o Cleanse wound with mild soap and water o May Shower, gently pat wound dry prior to applying new dressing. Wound #2 Midline,Posterior Neck o Cleanse wound with mild soap and water o May Shower, gently pat wound dry prior to applying new dressing. Anesthetic (add to Medication List) Wound #1 Left Head - Temporal o Topical Lidocaine 4% cream applied to wound bed prior to debridement (In Clinic Only). Wound #2 Midline,Posterior Neck o Topical Lidocaine 4% cream applied to wound bed prior to debridement (In Clinic Only). Primary Wound Dressing Wound #1 Left Head - Temporal o Other: -  Restore Silver Contact Layer Wound #2 Midline,Posterior Neck o Iodoflex Secondary Dressing Wound #1 Left Head -  Temporal o Non-adherent pad - secure with stretch net Wound #2 Midline,Posterior Neck o Boardered Foam Dressing Dressing Change Frequency Wound #1 Left Head - Temporal o Change Dressing Monday, Wednesday, Friday Wound #2 Midline,Posterior Neck Glenn Reeves, Glenn A. ([id number]) o Change Dressing Monday, Wednesday, Friday Follow-up Appointments Wound #1 Left Head - Temporal o Return Appointment in 3 weeks. Wound #2 Midline,Posterior Neck o Return Appointment in 3 weeks. Home Health Wound #1 Left Head - Temporal o [place] Visits - [place] Nurse may visit PRN to address patientos wound care needs. o FACE TO FACE ENCOUNTER: MEDICARE and MEDICAID PATIENTS: I certify that this patient is under my care and that I had a face-to-face encounter that meets the physician face-to-face encounter requirements with this patient on this date. The encounter with the patient was in whole or in part for the following MEDICAL CONDITION: (primary reason for [place]) MEDICAL NECESSITY: I certify, that based on my findings, NURSING services are a medically necessary home health service. HOME BOUND STATUS: I certify that my clinical findings support that this patient is homebound (i.e., Due to illness or injury, pt requires aid of supportive devices such as crutches, cane, wheelchairs, walkers, the use of special transportation or the assistance of another person to leave their place of residence. There is a normal inability to leave the home and doing so requires considerable and taxing effort. Other absences are for medical reasons / religious services and are infrequent or of short duration when for other reasons). o If current dressing causes regression in wound condition, may D/C ordered dressing product/s and apply Normal Saline Moist  Dressing daily until next [place] / Other MD appointment. [place] of regression in wound condition at [phone]. o Please direct any NON-WOUND related issues/requests for orders to patient's Primary Care Physician Wound #2 [place] Visits - [place] Nurse may visit PRN to address patientos wound care needs. o FACE TO FACE ENCOUNTER: MEDICARE and MEDICAID PATIENTS: I certify that this patient is under my care and that I had a face-to-face encounter that meets the physician face-to-face encounter requirements with this patient on this date. The encounter with the patient was in whole or in part for the following MEDICAL CONDITION: (primary reason for [place]) MEDICAL NECESSITY: I certify, that based on my findings, NURSING services are a medically necessary home health service. HOME BOUND STATUS: I certify that my clinical findings support that this patient is homebound (i.e., Due to illness or injury, pt requires aid of supportive devices such as crutches, cane, wheelchairs, walkers, the use of special transportation or the assistance of another person to leave their place of residence. There is a normal inability to leave the home and doing so requires considerable and taxing effort. Other absences are for medical reasons / religious services and are infrequent or of short duration when for other reasons). o If current dressing causes regression in wound condition, may D/C ordered dressing product/s and apply Normal Saline Moist Dressing daily until next [place] / Other MD appointment. [place] of regression in wound condition at [phone]. o Please direct any NON-WOUND related issues/requests for orders to patient's Primary Care Physician Electronic Signature(s) Signed: 02/12/2018 4:14:49 PM By: Gretta Cool, BSN, RN, CWS, Kim RN, BSN Signed: 02/14/2018 1:45:00  AM By: Worthy Keeler PA-C Entered By: Gretta Cool, BSN, RN, CWS, Kim on 02/12/2018 11:04:46 Deleonardis, Beckhem A. ([id number]) --------------------------------------------------------------------------------  Problem List Details Patient Name: JANEK, Daysen A. Date of Service: 02/12/2018 10:15 AM Medical Record Number: [id number] Patient Account Number: [id number] Date of Birth/Sex: Dec 26, 1930 (84 y.o. M) Treating RN: Montey Hora Primary Care Provider: Ramonita Lab Other Clinician: Referring Provider: Ramonita Lab Treating Provider/Extender: Melburn Hake, HOYT Weeks in Treatment: 4 Active Problems ICD-10 Evaluated Encounter Code Description Active Date Today Diagnosis C44.92 Squamous cell carcinoma of skin, unspecified 01/15/2018 No Yes L98.492 Non-pressure chronic ulcer of skin of other sites with fat layer 01/15/2018 No Yes exposed E11.622 Type 2 diabetes mellitus with other skin ulcer 01/15/2018 No Yes L89.890 Pressure ulcer of other site, unstageable 02/12/2018 No Yes I10 Essential (primary) hypertension 01/15/2018 No Yes Inactive Problems Resolved Problems Electronic Signature(s) Signed: 02/14/2018 1:45:00 AM By: Worthy Keeler PA-C Entered By: Worthy Keeler on 02/12/2018 11:08:56 Dusek, Ariq A. ([id number]) -------------------------------------------------------------------------------- Progress Note Details Patient Name: Glenn Bras A. Date of Service: 02/12/2018 10:15 AM Medical Record Number: [id number] Patient Account Number: [id number] Date of Birth/Sex: 27-Nov-1930 (84 y.o. 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 The patient actually went to have a Mohs surgery what sounds to be somewhere between 2-3 years ago. Nonetheless during the most surgery the surgeon/pathologist stated that he could not really clear the margins and felt that a plastic surgery referral may be necessary. Subsequently the patient was actually eventually referred to [facility] for plastic surgery to remove the carcinoma and at that point there was no bone involvement according to the patient's daughter. Nonetheless due to several extenuating circumstances initially the surgery was postponed about a surgeon due to some conflict secondarily to that it was again postponed due to the patient being ill during the time the surgery was put supposed to be next performed. Nonetheless in the end the patient opted to forgo any surgery whatsoever following and did not really have reevaluation until 12/26/17 when he did have bone involvement. The CT scan reads that the patient has a large left frontal scalp mass measuring up to 8.8 x 3.5 cm with extensive erosion of the left frontal calvarium, left frontal sinus, left maxillary process, in the superior and lateral walls of the left orbit. This appears to be a invasive skin carcinoma. Unfortunately at this time the decision has been made to proceed with radiation therapy as a palliative process not curative currently. Basically the patient comes in today by way of his daughter who is his primary caretaker at this point in order for [place] to recommend options as far as dressings are concerned to help manage this in a palliative manner patient's hemoglobin A1c when last taken was 6.6  and this was on 12/21/17. Currently the patient does have pain at the site of the cancer and there is a definite open wound at the site as well which does drain although his daughter states it does not seem to drain to significantly. There is no evidence  of infection at this time. 02/12/18 on evaluation today patient appears to be doing overall about the same in regard to the left temple ulcer. This again is a cancerous/malignant wound. Nonetheless it is a little bit larger he has been undergoing radiation therapy which is likely part of the reason for this. Nonetheless it does not look bad. A silver contact layer has been utilized at this point by home health which I think is definitely appropriate it does look like it's doing a good job. As far as the overall appearance of the wound otherwise I'm seeing no evidence of hyper granular tissue apparently Hydrofera Blue Dressing mentioned as a possibility I'm not even sure that would be necessary. I do think we need to try to avoid tape to the area as much is possible due to the fact that it seems to be causing some skin tearing. He does have a new wound on the posterior portion of his neck which is actually due to a pressure area where I believe his BiPAP machine is actually pushing on back of his neck too tightly. This is painful today. Patient History Information obtained from Patient. Family History Heart Disease - Father,Siblings, Stroke - Siblings, No family history of Cancer, Diabetes, Hypertension, Kidney Disease, Lung Disease, Seizures, Thyroid Problems, Tuberculosis. TALIN, FEISTER ([id number]) Social History Former smoker - cigar, Marital Status - Widowed, Alcohol Use - Never, Drug Use - No History, Caffeine Use - Rarely. Medical And Surgical History Notes Ear/Nose/Mouth/Throat left ear squamous cell removed Cardiovascular Dr Ramonita Lab has pulmonary fibrosis Review of Systems (ROS) Constitutional Symptoms (General  Health) Denies complaints or symptoms of Fever, Chills. Respiratory The patient has no complaints or symptoms. Cardiovascular The patient has no complaints or symptoms. Psychiatric The patient has no complaints or symptoms. Objective Constitutional Well-nourished and well-hydrated in no acute distress. Vitals Time Taken: 10:32 AM, Height: 69 in, Weight: 166 lbs, BMI: 24.5, Temperature: 97.7 F, Pulse: 64 bpm, Respiratory Rate: 18 breaths/min, Blood Pressure: 103/43 mmHg. Respiratory normal breathing without difficulty. clear to auscultation bilaterally. Cardiovascular regular rate and rhythm with normal S1, S2. Psychiatric Patient is not able to cooperate in decision making regarding care. Patient is oriented to person only. pleasant and cooperative. General Notes: Patient's wound bed currently on the left temple actually appears to be doing decently well at this time. There does not appear to be any evidence of infection at this time. He is undergoing radiation therapy there's definitely some skin changes/irritation surrounding this area likely due to the radiation. Nonetheless in regard to the new wound on the posterior portion the neck this really should have some sharp debridement performed. With that being said the patient actually has discomfort even with light touch to the area he really was not wanting me to do anything further as far as debridement was concerned therefore debridement was avoided today. Integumentary (Hair, Skin) Wound #1 status is Open. Original cause of wound was Other Lesion. The wound is located on the Left Head - Temporal. The wound measures 3.5cm length x 0.5cm width x 0.3cm depth; 1.374cm^2 area and 0.412cm^3 volume. There is Fat Layer (Subcutaneous Tissue) Exposed exposed. There is no tunneling or undermining noted. There is a large amount of serosanguineous drainage noted. The wound margin is indistinct and nonvisible. There is large (67-100%) red,  pink granulation within the wound bed. There is a small (1-33%) amount of necrotic tissue within the wound bed including Eschar. BURAK, ZERBE A. ([id number]) The periwound skin appearance exhibited: Excoriation,  Scarring, Ecchymosis. The periwound skin appearance did not exhibit: Callus, Crepitus, Induration, Rash, Dry/Scaly, Maceration, Atrophie Blanche, Cyanosis, Hemosiderin Staining, Mottled, Pallor, Rubor, Erythema. Periwound temperature was noted as No Abnormality. Wound #2 status is Open. Original cause of wound was Pressure Injury. The wound is located on the [place] Neck. The wound measures 0.5cm length x 0.9cm width x 0.1cm depth; 0.353cm^2 area and 0.035cm^3 volume. There is Fat Layer (Subcutaneous Tissue) Exposed exposed. There is a medium amount of serous drainage noted. The wound margin is flat and intact. There is no granulation within the wound bed. There is a large (67-100%) amount of necrotic tissue within the wound bed including Eschar. The periwound skin appearance had no abnormalities noted for texture. The periwound skin appearance had no abnormalities noted for moisture. The periwound skin appearance had no abnormalities noted for color. Assessment Active Problems ICD-10 Squamous cell carcinoma of skin, unspecified Non-pressure chronic ulcer of skin of other sites with fat layer exposed Type 2 diabetes mellitus with other skin ulcer Pressure ulcer of other site, unstageable Essential (primary) hypertension Plan Wound Cleansing: Wound #1 Left Head - Temporal: Cleanse wound with mild soap and water May Shower, gently pat wound dry prior to applying new dressing. Wound #2 Midline,Posterior Neck: Cleanse wound with mild soap and water May Shower, gently pat wound dry prior to applying new dressing. Anesthetic (add to Medication List): Wound #1 Left Head - Temporal: Topical Lidocaine 4% cream applied to wound bed prior to debridement (In Clinic  Only). Wound #2 Midline,Posterior Neck: Topical Lidocaine 4% cream applied to wound bed prior to debridement (In Clinic Only). Primary Wound Dressing: Wound #1 Left Head - Temporal: Other: - Restore Silver Contact Layer Wound #2 Midline,Posterior Neck: Iodoflex Secondary Dressing: Wound #1 Left Head - Temporal: Non-adherent pad - secure with stretch net Wound #2 Midline,Posterior Neck: Boardered Foam Dressing Dressing Change Frequency: Wound #1 Left Head - Temporal: Change Dressing Monday, Wednesday, Friday Wound #2 Midline,Posterior Neck: Glenn Reeves, Glenn A. ([id number]) Change Dressing Monday, Wednesday, Friday Follow-up Appointments: Wound #1 Left Head - Temporal: Return Appointment in 3 weeks. Wound #2 Midline,Posterior Neck: Return Appointment in 3 weeks. Home Health: Wound #1 Left Head - Temporal: Continue Home Health Visits - [place] Nurse may visit PRN to address patient s wound care needs. FACE TO FACE ENCOUNTER: MEDICARE and MEDICAID PATIENTS: I certify that this patient is under my care and that I had a face-to-face encounter that meets the physician face-to-face encounter requirements with this patient on this date. The encounter with the patient was in whole or in part for the following MEDICAL CONDITION: (primary reason for [place]) MEDICAL NECESSITY: I certify, that based on my findings, NURSING services are a medically necessary home health service. HOME BOUND STATUS: I certify that my clinical findings support that this patient is homebound (i.e., Due to illness or injury, pt requires aid of supportive devices such as crutches, cane, wheelchairs, walkers, the use of special transportation or the assistance of another person to leave their place of residence. There is a normal inability to leave the home and doing so requires considerable and taxing effort. Other absences are for medical reasons / religious services and are infrequent or of  short duration when for other reasons). If current dressing causes regression in wound condition, may D/C ordered dressing product/s and apply Normal Saline Moist Dressing daily until next [place] / Other MD appointment. [place] of regression in wound condition at [phone]. Please direct  any NON-WOUND related issues/requests for orders to patient's Primary Care Physician Wound #2 Midline,Posterior Neck: [place] Visits - [place] Nurse may visit PRN to address patient s wound care needs. FACE TO FACE ENCOUNTER: MEDICARE and MEDICAID PATIENTS: I certify that this patient is under my care and that I had a face-to-face encounter that meets the physician face-to-face encounter requirements with this patient on this date. The encounter with the patient was in whole or in part for the following MEDICAL CONDITION: (primary reason for [place]) MEDICAL NECESSITY: I certify, that based on my findings, NURSING services are a medically necessary home health service. HOME BOUND STATUS: I certify that my clinical findings support that this patient is homebound (i.e., Due to illness or injury, pt requires aid of supportive devices such as crutches, cane, wheelchairs, walkers, the use of special transportation or the assistance of another person to leave their place of residence. There is a normal inability to leave the home and doing so requires considerable and taxing effort. Other absences are for medical reasons / religious services and are infrequent or of short duration when for other reasons). If current dressing causes regression in wound condition, may D/C ordered dressing product/s and apply Normal Saline Moist Dressing daily until next [place] / Other MD appointment. Jennings of regression in wound condition at [phone]. Please direct any NON-WOUND related issues/requests for orders to patient's  Primary Care Physician I'm gonna suggest currently that we initiate the above wound care measures for the next three weeks. Time. He's in agreement with the plan more importantly his daughter is as well his present during the office visit today. I'll be see the patient is somewhat aware of what is going on that the same time she helps with decision-making. We are going to pad the back of his neck with a Boarder Foam Dressing I'm also going to recommend padding the BiPAP machine strap as I feel like this is likely pushing on the area and causing problems. The patient is in agreement that plan. If anything changes the meantime patient's daughter will contact the office and let us know. Please see above for specific wound care orders. We will see patient for re-evaluation in 3 week(s) here in the clinic. If anything worsens or changes patient will contact our office for additional recommendations. Electronic Signature(s) Signed: 02/16/2018 12:12:33 AM By: Worthy Keeler PA-C Previous Signature: 02/14/2018 1:45:00 AM Version By: Worthy Keeler PA-C Entered By: Worthy Keeler on 02/15/2018 23:55:59 Glenn Reeves ([id number]) Glenn Reeves, Glenn Reeves ([id number]) -------------------------------------------------------------------------------- ROS/PFSH Details Patient Name: Glenn Bras A. Date of Service: 02/12/2018 10:15 AM Medical Record Number: [id number] Patient Account Number: [id number] Date of Birth/Sex: [birth year] (84 y.o. M) Treating RN: Montey Hora Primary Care Provider: Ramonita Lab Other Clinician: Referring Provider: Ramonita Lab Treating Provider/Extender: Melburn Hake, HOYT Weeks in Treatment: 4 Information Obtained From Patient Wound History Do you currently have one or more open woundso Yes How many open wounds do you currently haveo 1 Approximately how long have you had your woundso 02/23/2017 How have you been treating your wound(s) until nowo peroxide/ saline  bandage Has your wound(s) ever healed and then re-openedo Yes Have you had any lab work done in the past montho No Have you tested positive for an antibiotic resistant organism (MRSA, VRE)o No Have you tested positive for osteomyelitis (bone infection)o No Have you had any tests for circulation on your legso No Constitutional Symptoms (General Health)  Complaints and Symptoms: Negative for: Fever; Chills Eyes Medical History: Positive for: Cataracts - removed Negative for: Glaucoma; Optic Neuritis Ear/Nose/Mouth/Throat Medical History: Negative for: Chronic sinus problems/congestion; Middle ear problems Past Medical History Notes: left ear squamous cell removed Hematologic/Lymphatic Medical History: Negative for: Anemia; Hemophilia; Human Immunodeficiency Virus; Lymphedema; Sickle Cell Disease Respiratory Complaints and Symptoms: No Complaints or Symptoms Medical History: Positive for: Chronic Obstructive Pulmonary Disease (COPD); Sleep Apnea Negative for: Asthma; Pneumothorax; Tuberculosis Cardiovascular Complaints and Symptoms: No Complaints or Symptoms Glenn Reeves, Glenn A. ([id number]) Medical History: Positive for: Arrhythmia - afib; Congestive Heart Failure; Hypertension Negative for: Coronary Artery Disease; Deep Vein Thrombosis; Hypotension; Myocardial Infarction; Peripheral Arterial Disease; Peripheral Venous Disease; Phlebitis; Vasculitis Past Medical History Notes: Dr Ramonita Lab has pulmonary fibrosis Gastrointestinal Medical History: Negative for: Cirrhosis ; Colitis; Crohnos; Hepatitis A; Hepatitis B; Hepatitis C Endocrine Medical History: Positive for: Type II Diabetes Negative for: Type I Diabetes Time with diabetes: 20 years Treated with: Insulin Blood sugar tested every day: Yes Tested : tid Blood sugar testing results: Breakfast: 90 Genitourinary Medical History: Negative for: End Stage Renal Disease Immunological Medical History: Negative for:  Lupus Erythematosus; Raynaudos; Scleroderma Integumentary (Skin) Medical History: Negative for: History of Burn; History of pressure wounds Musculoskeletal Medical History: Negative for: Gout; Rheumatoid Arthritis; Osteoarthritis; Osteomyelitis Neurologic Medical History: Negative for: Dementia; Neuropathy; Quadriplegia; Paraplegia; Seizure Disorder Oncologic Medical History: Negative for: Received Chemotherapy; Received Radiation Psychiatric Complaints and Symptoms: No Complaints or Symptoms Glenn Reeves, Glenn A. ([id number]) HBO Extended History Items Eyes: Cataracts Immunizations Pneumococcal Vaccine: Received Pneumococcal Vaccination: No Implantable Devices Family and Social History Cancer: No; Diabetes: No; Heart Disease: Yes - Father,Siblings; Hypertension: No; Kidney Disease: No; Lung Disease: No; Seizures: No; Stroke: Yes - Siblings; Thyroid Problems: No; Tuberculosis: No; Former smoker - cigar; Marital Status - Widowed; Alcohol Use: Never; Drug Use: No History; Caffeine Use: Rarely; Financial Concerns: No; Food, Clothing or Shelter Needs: No; Support System Lacking: No; Transportation Concerns: No; Advanced Directives: Yes (Not Provided); Patient does not want information on Advanced Directives; Do not resuscitate: No; Living Will: Yes (Not Provided); Medical Power of Attorney: No Physician Affirmation I have reviewed and agree with the above information. Electronic Signature(s) Signed: 02/12/2018 5:03:55 PM By: Montey Hora Signed: 02/14/2018 1:45:00 AM By: Worthy Keeler PA-C Entered By: Worthy Keeler on 02/12/2018 11:09:23 Glenn Bras AMarland Kitchen ([id number]) -------------------------------------------------------------------------------- SuperBill Details Patient Name: Glenn Bras A. Date of Service: 02/12/2018 Medical Record Number: [id number] Patient Account Number: [id number] Date of Birth/Sex: January 31, 1931 (84 y.o. 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 E11.622 Type 2 diabetes mellitus with other skin ulcer L89.890 Pressure ulcer of other site, unstageable I10 Essential (primary) hypertension Facility Procedures CPT4 Code: [id number] Description: 99213 - WOUND CARE VISIT-LEV 3 EST PT Modifier: Quantity: 1 Physician Procedures CPT4 Code: [id number] Description: 89211 - WC PHYS LEVEL 4 - EST PT ICD-10 Diagnosis Description C44.92 Squamous cell carcinoma of skin, unspecified L98.492 Non-pressure chronic ulcer of skin of other sites with fat l E11.622 Type 2 diabetes mellitus with other skin ulcer  L89.890 Pressure ulcer of other site, unstageable Modifier: ayer exposed Quantity: 1 Electronic Signature(s) Signed: 02/14/2018 8:07:46 AM By: Gretta Cool, BSN, RN, CWS, Kim RN, BSN Signed: 02/15/2018 8:35:57 AM By: Worthy Keeler  PA-C Previous Signature: 02/14/2018 1:45:00 AM Version By: Worthy Keeler PA-C Entered By: Gretta Cool, BSN, RN, CWS, Kim on 02/14/2018 08:07:45

## 2018-02-17 NOTE — Progress Notes (Addendum)
Glenn, Reeves ([id number]) Visit Report for 02/12/2018 Arrival Information Details Patient Name: Glenn Reeves, Glenn A. Date of Service: 02/12/2018 10:15 AM Medical Record Number: [id number] Patient Account Number: [id number] Date of Birth/Sex: Aug 05, 1930 (84 y.o. M) Treating RN: Cornell Barman Primary Care Presly Steinruck: Ramonita Lab Other Clinician: Referring Jarren Para: Ramonita Lab Treating Valbona Slabach/Extender: Melburn Hake, HOYT Weeks in Treatment: 4 Visit Information History Since Last Visit Added or deleted any medications: No Patient Arrived: Wheel Chair Any new allergies or adverse reactions: No Arrival Time: 10:30 Had a fall or experienced change in No Accompanied By: daughter activities of daily living that may affect Transfer Assistance: Manual risk of falls: Patient Identification Verified: Yes Signs or symptoms of abuse/neglect since last visito No Secondary Verification Process Completed: Yes Hospitalized since last visit: No Implantable device outside of the clinic excluding No cellular tissue based products placed in the center since last visit: Has Dressing in Place as Prescribed: Yes Pain Present Now: No Electronic Signature(s) Signed: 02/12/2018 4:14:49 PM By: Gretta Cool, BSN, RN, CWS, Kim RN, BSN Entered By: Gretta Cool, BSN, RN, CWS, Kim on 02/12/2018 10:32:17 Dorthey Sawyer ([id number]) -------------------------------------------------------------------------------- Clinic Level of Care Assessment Details Patient Name: Glenn Bras A. Date of Service: 02/12/2018 10:15 AM Medical Record Number: [id number] Patient Account Number: [id number] Date of Birth/Sex: [birth year] (84 y.o. M) Treating RN: Cornell Barman Primary Care Adra Shepler: Ramonita Lab Other Clinician: Referring Laurieann Friddle: Ramonita Lab Treating Emmanuel Gruenhagen/Extender: Melburn Hake, HOYT Weeks in Treatment: 4 Clinic Level of Care Assessment Items TOOL 4 Quantity Score []  - Use when only an EandM is performed on FOLLOW-UP  visit 0 ASSESSMENTS - Nursing Assessment / Reassessment []  - Reassessment of Co-morbidities (includes updates in patient status) 0 X- 1 5 Reassessment of Adherence to Treatment Plan ASSESSMENTS - Wound and Skin Assessment / Reassessment []  - Simple Wound Assessment / Reassessment - one wound 0 X- 2 5 Complex Wound Assessment / Reassessment - multiple wounds []  - 0 Dermatologic / Skin Assessment (not related to wound area) ASSESSMENTS - Focused Assessment []  - Circumferential Edema Measurements - multi extremities 0 []  - 0 Nutritional Assessment / Counseling / Intervention []  - 0 Lower Extremity Assessment (monofilament, tuning fork, pulses) []  - 0 Peripheral Arterial Disease Assessment (using hand held doppler) ASSESSMENTS - Ostomy and/or Continence Assessment and Care []  - Incontinence Assessment and Management 0 []  - 0 Ostomy Care Assessment and Management (repouching, etc.) PROCESS - Coordination of Care []  - Simple Patient / Family Education for ongoing care 0 X- 1 20 Complex (extensive) Patient / Family Education for ongoing care []  - 0 Staff obtains Programmer, systems, Records, Test Results / Process Orders []  - 0 Staff telephones HHA, Nursing Homes / Clarify orders / etc []  - 0 Routine Transfer to another Facility (non-emergent condition) []  - 0 Routine Hospital Admission (non-emergent condition) []  - 0 New Admissions / Biomedical engineer / Ordering NPWT, Apligraf, etc. []  - 0 Emergency Hospital Admission (emergent condition) X- 1 10 Simple Discharge Coordination Colleran, Equan A. ([id number]) []  - 0 Complex (extensive) Discharge Coordination PROCESS - Special Needs []  - Pediatric / Minor Patient Management 0 []  - 0 Isolation Patient Management []  - 0 Hearing / Language / Visual special needs []  - 0 Assessment of Community assistance (transportation, D/C planning, etc.) []  - 0 Additional assistance / Altered mentation []  - 0 Support Surface(s) Assessment  (bed, cushion, seat, etc.) INTERVENTIONS - Wound Cleansing / Measurement []  - Simple Wound Cleansing - one wound 0 X- 2 5 Complex Wound Cleansing - multiple wounds  X- 1 5 Wound Imaging (photographs - any number of wounds) []  - 0 Wound Tracing (instead of photographs) []  - 0 Simple Wound Measurement - one wound X- 2 5 Complex Wound Measurement - multiple wounds INTERVENTIONS - Wound Dressings []  - Small Wound Dressing one or multiple wounds 0 X- 2 15 Medium Wound Dressing one or multiple wounds []  - 0 Large Wound Dressing one or multiple wounds []  - 0 Application of Medications - topical []  - 0 Application of Medications - injection INTERVENTIONS - Miscellaneous []  - External ear exam 0 []  - 0 Specimen Collection (cultures, biopsies, blood, body fluids, etc.) []  - 0 Specimen(s) / Culture(s) sent or taken to Lab for analysis []  - 0 Patient Transfer (multiple staff / Civil Service fast streamer / Similar devices) []  - 0 Simple Staple / Suture removal (25 or less) []  - 0 Complex Staple / Suture removal (26 or more) []  - 0 Hypo / Hyperglycemic Management (close monitor of Blood Glucose) []  - 0 Ankle / Brachial Index (ABI) - do not check if billed separately []  - 0 Vital Signs Caldas, Kyriakos A. ([id number]) Has the patient been seen at the hospital within the last three years: Yes Total Score: 100 Level Of Care: New/Established - Level 3 Electronic Signature(s) Signed: 02/14/2018 8:10:18 AM By: Gretta Cool, BSN, RN, CWS, Kim RN, BSN Entered By: Gretta Cool, BSN, RN, CWS, Kim on 02/14/2018 08:07:33 Dorthey Sawyer ([id number]) -------------------------------------------------------------------------------- Encounter Discharge Information Details Patient Name: Glenn Bras A. Date of Service: 02/12/2018 10:15 AM Medical Record Number: [id number] Patient Account Number: [id number] Date of Birth/Sex: 01-Oct-1930 (84 y.o. M) Treating RN: Cornell Barman Primary Care Ladislav Caselli: Ramonita Lab Other  Clinician: Referring Aris Moman: Ramonita Lab Treating Zarek Relph/Extender: Melburn Hake, HOYT Weeks in Treatment: 4 Encounter Discharge Information Items Discharge Condition: Stable Ambulatory Status: Walker Discharge Destination: Home Transportation: Private Auto Accompanied By: daughter Schedule Follow-up Appointment: Yes Clinical Summary of Care: Electronic Signature(s) Signed: 02/14/2018 8:08:59 AM By: Gretta Cool, BSN, RN, CWS, Kim RN, BSN Entered By: Gretta Cool, BSN, RN, CWS, Kim on 02/14/2018 08:08:59 Dorthey Sawyer ([id number]) -------------------------------------------------------------------------------- Lower Extremity Assessment Details Patient Name: Glenn Bras A. Date of Service: 02/12/2018 10:15 AM Medical Record Number: [id number] Patient Account Number: [id number] Date of Birth/Sex: [birth year] (84 y.o. M) Treating RN: Cornell Barman Primary Care Javi Bollman: Ramonita Lab Other Clinician: Referring Kian Gamarra: Ramonita Lab Treating Lizeth Bencosme/Extender: Worthy Keeler Weeks in Treatment: 4 Electronic Signature(s) Signed: 02/12/2018 4:14:49 PM By: Gretta Cool, BSN, RN, CWS, Kim RN, BSN Entered By: Gretta Cool, BSN, RN, CWS, Kim on 02/12/2018 10:43:15 Dorthey Sawyer ([id number]) -------------------------------------------------------------------------------- Multi Wound Chart Details Patient Name: Glenn Bras A. Date of Service: 02/12/2018 10:15 AM Medical Record Number: [id number] Patient Account Number: [id number] Date of Birth/Sex: Feb 27, 1931 (84 y.o. M) Treating RN: Cornell Barman Primary Care Arwa Yero: Ramonita Lab Other Clinician: Referring London Nonaka: Ramonita Lab Treating Aeneas Longsworth/Extender: Melburn Hake, HOYT Weeks in Treatment: 4 Vital Signs Height(in): 16 Pulse(bpm): 35 Weight(lbs): 166 Blood Pressure(mmHg): 103/43 Body Mass Index(BMI): 25 Temperature(F): 97.7 Respiratory Rate 18 (breaths/min): Photos: [1:No Photos] [2:No Photos] [N/A:N/A] Wound Location: [1:Left Head -  Temporal] [2:Neck - Midline, Posterior] [N/A:N/A] Wounding Event: [1:Other Lesion] [2:Pressure Injury] [N/A:N/A] Primary Etiology: [1:Malignant Wound] [2:Pressure Ulcer] [N/A:N/A] Comorbid History: [1:Cataracts, Chronic Obstructive Cataracts, Chronic Obstructive N/A Pulmonary Disease (COPD), Pulmonary Disease (COPD), Sleep Apnea, Arrhythmia, Congestive Heart Failure, Hypertension, Type II Diabetes Hypertension, Type II Diabetes]  [2:Sleep Apnea, Arrhythmia, Congestive Heart Failure,] Date Acquired: [1:02/23/2017] [2:02/05/2018] [N/A:N/A] Weeks of Treatment: [1:4] [2:0] [N/A:N/A] Wound Status: [1:Open] [2:Open] [N/A:N/A] Measurements L x  W x D [1:3.5x0.5x0.3] [2:0.5x0.9x0.1] [N/A:N/A] (cm) Area (cm) : [1:1.374] [2:0.353] [N/A:N/A] Volume (cm) : [1:0.412] [2:0.035] [N/A:N/A] % Reduction in Area: [1:91.40%] [2:0.00%] [N/A:N/A] % Reduction in Volume: [1:87.00%] [2:0.00%] [N/A:N/A] Classification: [1:Full Thickness Without Exposed Support Structures] [2:Unstageable/Unclassified] [N/A:N/A] Exudate Amount: [1:Large] [2:Medium] [N/A:N/A] Exudate Type: [1:Serosanguineous] [2:Serous] [N/A:N/A] Exudate Color: [1:red, brown] [2:amber] [N/A:N/A] Wound Margin: [1:Indistinct, nonvisible] [2:Flat and Intact] [N/A:N/A] Granulation Amount: [1:Large (67-100%)] [2:None Present (0%)] [N/A:N/A] Granulation Quality: [1:Red, Pink] [2:N/A] [N/A:N/A] Necrotic Amount: [1:Small (1-33%)] [2:Large (67-100%)] [N/A:N/A] Necrotic Tissue: [1:Eschar] [2:Eschar] [N/A:N/A] Exposed Structures: [1:Fat Layer (Subcutaneous Tissue) Exposed: Yes Fascia: No Tendon: No Muscle: No Joint: No Bone: No] [2:Fascia: No Fat Layer (Subcutaneous Tissue) Exposed: No Tendon: No Muscle: No Joint: No Bone: No] [N/A:N/A] Epithelialization: [1:None] [2:N/A] [N/A:N/A] Periwound Skin Texture: [N/A:N/A] Excoriation: Yes Excoriation: No Scarring: Yes Induration: No Induration: No Callus: No Callus: No Crepitus: No Crepitus: No Rash:  No Rash: No Scarring: No Periwound Skin Moisture: Maceration: No Maceration: No N/A Dry/Scaly: No Dry/Scaly: No Periwound Skin Color: Ecchymosis: Yes Atrophie Blanche: No N/A Atrophie Blanche: No Cyanosis: No Cyanosis: No Ecchymosis: No Erythema: No Erythema: No Hemosiderin Staining: No Hemosiderin Staining: No Mottled: No Mottled: No Pallor: No Pallor: No Rubor: No Rubor: No Temperature: No Abnormality N/A N/A Tenderness on Palpation: No No N/A Wound Preparation: Ulcer Cleansing: Ulcer Cleansing: N/A Rinsed/Irrigated with Saline Rinsed/Irrigated with Saline Topical Anesthetic Applied: Topical Anesthetic Applied: Other: lidocaine 4% Other: lidocaine 4% Treatment Notes Electronic Signature(s) Signed: 02/12/2018 4:14:49 PM By: Gretta Cool, BSN, RN, CWS, Kim RN, BSN Entered By: Gretta Cool, BSN, RN, CWS, Kim on 02/12/2018 10:45:14 Dorthey Sawyer ([id number]) -------------------------------------------------------------------------------- [place] Details Patient Name: Glenn Bras A. Date of Service: 02/12/2018 10:15 AM Medical Record Number: [id number] Patient Account Number: [id number] Date of Birth/Sex: March 21, 1931 (84 y.o. M) Treating RN: Cornell Barman Primary Care Marq Rebello: Ramonita Lab Other Clinician: Referring Vanshika Jastrzebski: Ramonita Lab Treating Samantha Olivera/Extender: Melburn Hake, HOYT Weeks in Treatment: 4 Active Inactive ` Abuse / Safety / Falls / Self Care Management Nursing Diagnoses: Impaired physical mobility Goals: Patient will remain injury free related to falls Date Initiated: 01/15/2018 Target Resolution Date: 03/22/2018 Goal Status: Active Interventions: Assess fall risk on admission and as needed Notes: ` Malignancy/Atypical Etiology Nursing Diagnoses: Knowledge deficit related to disease process and management of malignancy Goals: Patient/caregiver will verbalize understanding of disease process and disease management of atypical  ulcer etiology Date Initiated: 02/12/2018 Target Resolution Date: 02/22/2018 Goal Status: Active Interventions: Assess patient and family medical history for signs and symptoms of malignancy/atypical etiology upon admission Provide education on malignant ulcerations Notes: ` Necrotic Tissue Nursing Diagnoses: Knowledge deficit related to management of necrotic/devitalized tissue Goals: Patient/caregiver will verbalize understanding of reason and process for debridement of necrotic tissue Date Initiated: 02/12/2018 Target Resolution Date: 02/22/2018 Goal Status: Active JAMORIAN, DIMARIA A. ([id number]) Interventions: Assess patient pain level pre-, during and post procedure and prior to discharge Provide education on necrotic tissue and debridement process Treatment Activities: Apply topical anesthetic as ordered : 02/12/2018 Notes: ` Nutrition Nursing Diagnoses: Potential for alteratiion in Nutrition/Potential for imbalanced nutrition Goals: Patient/caregiver agrees to and verbalizes understanding of need to use nutritional supplements and/or vitamins as prescribed Date Initiated: 01/15/2018 Target Resolution Date: 03/22/2018 Goal Status: Active Interventions: Assess patient nutrition upon admission and as needed per policy Notes: ` Orientation to the Wound Care Program Nursing Diagnoses: Knowledge deficit related to the wound healing center program Goals: Patient/caregiver will verbalize understanding of the [place] Date Initiated: 01/15/2018 Target Resolution  Date: 03/22/2018 Goal Status: Active Interventions: Provide education on orientation to the wound center Notes: ` Wound/Skin Impairment Nursing Diagnoses: Impaired tissue integrity Goals: Ulcer/skin breakdown will heal within 14 weeks Date Initiated: 01/15/2018 Target Resolution Date: 03/22/2018 Goal Status: Active Interventions: Assess patient/caregiver ability to obtain necessary  supplies Ho, Greysyn A. ([id number]) Assess patient/caregiver ability to perform ulcer/skin care regimen upon admission and as needed Assess ulceration(s) every visit Notes: Electronic Signature(s) Signed: 02/12/2018 4:14:49 PM By: Gretta Cool, BSN, RN, CWS, Kim RN, BSN Entered By: Gretta Cool, BSN, RN, CWS, Kim on 02/12/2018 10:45:08 Dorthey Sawyer ([id number]) -------------------------------------------------------------------------------- Pain Assessment Details Patient Name: Glenn Bras A. Date of Service: 02/12/2018 10:15 AM Medical Record Number: [id number] Patient Account Number: [id number] Date of Birth/Sex: March 03, 1931 (84 y.o. M) Treating RN: Cornell Barman Primary Care Nashawn Hillock: Ramonita Lab Other Clinician: Referring Deva Ron: Ramonita Lab Treating Klayton Monie/Extender: Melburn Hake, HOYT Weeks in Treatment: 4 Active Problems Location of Pain Severity and Description of Pain Patient Has Paino No Site Locations Pain Management and Medication Current Pain Management: Electronic Signature(s) Signed: 02/12/2018 4:14:49 PM By: Gretta Cool, BSN, RN, CWS, Kim RN, BSN Entered By: Gretta Cool, BSN, RN, CWS, Kim on 02/12/2018 10:32:28 Dorthey Sawyer ([id number]) -------------------------------------------------------------------------------- Wound Assessment Details Patient Name: Glenn Bras A. Date of Service: 02/12/2018 10:15 AM Medical Record Number: [id number] Patient Account Number: [id number] Date of Birth/Sex: 06-Mar-1931 (84 y.o. M) Treating RN: Cornell Barman Primary Care Bettyanne Dittman: Ramonita Lab Other Clinician: Referring Brayam Boeke: Ramonita Lab Treating Carey Lafon/Extender: Melburn Hake, HOYT Weeks in Treatment: 4 Wound Status Wound Number: 1 Primary Malignant Wound Etiology: Wound Location: Left Head - Temporal Wound Open Wounding Event: Other Lesion Status: Date Acquired: 02/23/2017 Comorbid Cataracts, Chronic Obstructive Pulmonary Weeks Of Treatment: 4 History: Disease (COPD),  Sleep Apnea, Arrhythmia, Clustered Wound: No Congestive Heart Failure, Hypertension, Type II Diabetes Photos Photo Uploaded By: Sharon Mt on 02/13/2018 11:24:55 Wound Measurements Length: (cm) 3.5 Width: (cm) 0.5 Depth: (cm) 0.3 Area: (cm) 1.374 Volume: (cm) 0.412 % Reduction in Area: 91.4% % Reduction in Volume: 87% Epithelialization: None Tunneling: No Undermining: No Wound Description Full Thickness Without Exposed Support Foul Odo Classification: Structures Slough/F Wound Margin: Indistinct, nonvisible Exudate Large Amount: Exudate Type: Serosanguineous Exudate Color: red, brown r After Cleansing: No ibrino Yes Wound Bed Granulation Amount: Large (67-100%) Exposed Structure Granulation Quality: Red, Pink Fascia Exposed: No Necrotic Amount: Small (1-33%) Fat Layer (Subcutaneous Tissue) Exposed: Yes Necrotic Quality: Eschar Tendon Exposed: No Muscle Exposed: No Joint Exposed: No Zelek, Curry A. ([id number]) Bone Exposed: No Periwound Skin Texture Texture Color No Abnormalities Noted: No No Abnormalities Noted: No Callus: No Atrophie Blanche: No Crepitus: No Cyanosis: No Excoriation: Yes Ecchymosis: Yes Induration: No Erythema: No Rash: No Hemosiderin Staining: No Scarring: Yes Mottled: No Pallor: No Moisture Rubor: No No Abnormalities Noted: No Dry / Scaly: No Temperature / Pain Maceration: No Temperature: No Abnormality Wound Preparation Ulcer Cleansing: Rinsed/Irrigated with Saline Topical Anesthetic Applied: Other: lidocaine 4%, Treatment Notes Wound #1 (Left Head - Temporal) Notes Santyl Neck; Silvercell, telfa island and stretch net to secure (headband) Electronic Signature(s) Signed: 02/12/2018 4:14:49 PM By: Gretta Cool, BSN, RN, CWS, Kim RN, BSN Entered By: Gretta Cool, BSN, RN, CWS, Kim on 02/12/2018 10:43:06 Dorthey Sawyer ([id number]) -------------------------------------------------------------------------------- Wound  Assessment Details Patient Name: Glenn Bras A. Date of Service: 02/12/2018 10:15 AM Medical Record Number: [id number] Patient Account Number: [id number] Date of Birth/Sex: Jun 05, 1930 (84 y.o. M) Treating RN: Cornell Barman Primary Care Gabrial Domine: Ramonita Lab Other Clinician: Referring Lenny Fiumara: Ramonita Lab Treating Eliani Leclere/Extender: Melburn Hake, HOYT  Weeks in Treatment: 4 Wound Status Wound Number: 2 Primary Pressure Ulcer Etiology: Wound Location: Neck - Midline, Posterior Wound Open Wounding Event: Pressure Injury Status: Date Acquired: 02/05/2018 Comorbid Cataracts, Chronic Obstructive Pulmonary Weeks Of Treatment: 0 History: Disease (COPD), Sleep Apnea, Arrhythmia, Clustered Wound: No Congestive Heart Failure, Hypertension, Type II Diabetes Photos Wound Measurements Length: (cm) 0.5 % Reduction i Width: (cm) 0.9 % Reduction i Depth: (cm) 0.1 Area: (cm) 0.353 Volume: (cm) 0.035 n Area: 0% n Volume: 0% Wound Description Classification: Unstageable/Unclassified Foul Odor Aft Wound Margin: Flat and Intact Slough/Fibrin Exudate Amount: Medium Exudate Type: Serous Exudate Color: amber er Cleansing: No o Yes Wound Bed Granulation Amount: None Present (0%) Exposed Structure Necrotic Amount: Large (67-100%) Fascia Exposed: No Necrotic Quality: Eschar Fat Layer (Subcutaneous Tissue) Exposed: Yes Tendon Exposed: No Muscle Exposed: No Joint Exposed: No Bone Exposed: No Periwound Skin Texture Macfarlane, Jayant A. ([id number]) Texture Color No Abnormalities Noted: Yes No Abnormalities Noted: Yes Moisture No Abnormalities Noted: Yes Wound Preparation Ulcer Cleansing: Rinsed/Irrigated with Saline Topical Anesthetic Applied: Other: lidocaine 4%, Treatment Notes Wound #2 (Midline, Posterior Neck) Notes Santyl Neck; Silvercell, telfa island and stretch net to secure (headband) Electronic Signature(s) Signed: 02/16/2018 12:12:33 AM By: Worthy Keeler PA-C Signed:  02/18/2018 3:24:13 PM By: Gretta Cool, BSN, RN, CWS, Kim RN, BSN Previous Signature: 02/12/2018 4:14:49 PM Version By: Gretta Cool, BSN, RN, CWS, Kim RN, BSN Entered By: Worthy Keeler on 02/15/2018 23:55:11 Poarch, Andri A. ([id number]) -------------------------------------------------------------------------------- [place] Details Patient Name: Glenn Bras A. Date of Service: 02/12/2018 10:15 AM Medical Record Number: [id number] Patient Account Number: [id number] Date of Birth/Sex: [birth year] (84 y.o. M) Treating RN: Cornell Barman Primary Care Genola Yuille: Ramonita Lab Other Clinician: Referring Hebe Merriwether: Ramonita Lab Treating Cambell Stanek/Extender: Melburn Hake, HOYT Weeks in Treatment: 4 Vital Signs Time Taken: 10:32 Temperature (F): 97.7 Height (in): 69 Pulse (bpm): 64 Weight (lbs): 166 Respiratory Rate (breaths/min): 18 Body Mass Index (BMI): 24.5 Blood Pressure (mmHg): 103/43 Reference Range: 80 - 120 mg / dl Electronic Signature(s) Signed: 02/12/2018 4:14:49 PM By: Gretta Cool, BSN, RN, CWS, Kim RN, BSN Entered By: Gretta Cool, BSN, RN, CWS, Kim on 02/12/2018 10:33:42

## 2018-02-18 ENCOUNTER — Ambulatory Visit
Admission: RE | Admit: 2018-02-18 | Discharge: 2018-02-18 | Disposition: A | Discharge status: Home / Self Care | Payer: Medicare Other | Source: Ambulatory Visit | Attending: Radiation Oncology | Admitting: Radiation Oncology

## 2018-02-18 DIAGNOSIS — Z510 Encounter for antineoplastic radiation therapy: Secondary | ICD-10-CM | POA: Diagnosis not present

## 2018-02-19 ENCOUNTER — Inpatient Hospital Stay: Payer: Medicare Other

## 2018-02-19 ENCOUNTER — Ambulatory Visit
Admission: RE | Admit: 2018-02-19 | Discharge: 2018-02-19 | Disposition: A | Discharge status: Home / Self Care | Payer: Medicare Other | Source: Ambulatory Visit | Attending: Radiation Oncology | Admitting: Radiation Oncology

## 2018-02-19 ENCOUNTER — Other Ambulatory Visit: Payer: Self-pay

## 2018-02-19 DIAGNOSIS — C4492 Squamous cell carcinoma of skin, unspecified: Secondary | ICD-10-CM

## 2018-02-19 DIAGNOSIS — Z510 Encounter for antineoplastic radiation therapy: Secondary | ICD-10-CM | POA: Diagnosis not present

## 2018-02-19 LAB — CBC
HCT: 37.6 % — ABNORMAL LOW (ref 39.0–52.0)
Hemoglobin: 11.4 g/dL — ABNORMAL LOW (ref 13.0–17.0)
MCH: 27.7 pg (ref 26.0–34.0)
MCHC: 30.3 g/dL (ref 30.0–36.0)
MCV: 91.5 fL (ref 80.0–100.0)
PLATELETS: 186 10*3/uL (ref 150–400)
RBC: 4.11 MIL/uL — AB (ref 4.22–5.81)
RDW: 15.2 % (ref 11.5–15.5)
WBC: 8.1 10*3/uL (ref 4.0–10.5)
nRBC: 0 % (ref 0.0–0.2)

## 2018-03-04 ENCOUNTER — Encounter: Payer: Self-pay | Admitting: Radiation Oncology

## 2018-03-04 ENCOUNTER — Ambulatory Visit
Admission: RE | Admit: 2018-03-04 | Discharge: 2018-03-04 | Disposition: A | Discharge status: Home / Self Care | Payer: Medicare Other | Source: Ambulatory Visit | Attending: Radiation Oncology | Admitting: Radiation Oncology

## 2018-03-04 ENCOUNTER — Other Ambulatory Visit: Payer: Self-pay

## 2018-03-04 DIAGNOSIS — C4442 Squamous cell carcinoma of skin of scalp and neck: Secondary | ICD-10-CM

## 2018-03-04 DIAGNOSIS — Z923 Personal history of irradiation: Secondary | ICD-10-CM

## 2018-03-04 DIAGNOSIS — C4492 Squamous cell carcinoma of skin, unspecified: Secondary | ICD-10-CM

## 2018-03-04 NOTE — Progress Notes (Signed)
Radiation Oncology Follow up Note  Name: Glenn Reeves   Date:   03/04/2018 MRN:  [id number] DOB: 1930/08/25    This 84 y.o. 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  REFERRING PROVIDER: Adin Hector, MD  HPI: patient is a 84 year old male now out 2 weeks having completed initial course of radiation therapy to his left maxilla and left periorbital region for locally advanced squamous cell carcinoma..he received 4000 cGy over 4 weeks and is doing well specifically denies head and neck pain or dysphagia. Had a remarkable response in this area with significant reduction of tumor. He is accompanied by his daughter today.  COMPLICATIONS OF TREATMENT: none  FOLLOW UP COMPLIANCE: keeps appointments   PHYSICAL EXAM:  BP (!) (P) 110/42 (BP Location: Right Arm, Patient Position: Sitting)   Pulse (P) 78   Temp (!) (P) 97 F (36.1 C) (Tympanic)  Area of marked tumor involvement left perioral region is markedly reduced in size greater 50% response. Oral cavity is clear no oral mucosal lesions are identified. Neck is clear without evidence of subject gastric cervical or supraclavicular adenopathy.Well-developed well-nourished patient in NAD. HEENT reveals PERLA, EOMI, discs not visualized.  Oral cavity is clear. No oral mucosal lesions are identified. Neck is clear without evidence of cervical or supraclavicular adenopathy. Lungs are clear to A&P. Cardiac examination is essentially unremarkable with regular rate and rhythm without murmur rub or thrill. Abdomen is benign with no organomegaly or masses noted. Motor sensory and DTR levels are equal and symmetric in the upper and lower extremities. Cranial nerves II through XII are grossly intact. Proprioception is intact. No peripheral adenopathy or edema is identified. No motor or sensory levels are noted. Crude visual fields are  within normal range.  RADIOLOGY RESULTS: no current films for review  PLAN: at this time I like to do a CT scan as part of treatment planning for probable small field boost. I again would probably deliver 3000 cGy over 3 weeks to markedly reduced area of tumor involvement by CT criteria. Risks and benefits again of treatment including increasing chances of local tumor control were addressed with his daughter and the [place] comprehend our treatment plan well. I personally 7 ordered CT simulation for next week.  I would like to take this opportunity to thank you for allowing me to participate in the care of your patient.Noreene Filbert, MD

## 2018-03-05 ENCOUNTER — Emergency Department: Payer: Medicare Other

## 2018-03-05 ENCOUNTER — Inpatient Hospital Stay
Admission: EM | Admit: 2018-03-05 | Discharge: 2018-03-08 | DRG: 196 | Disposition: A | Discharge status: Home Health | Payer: Medicare Other | Attending: Internal Medicine | Admitting: Internal Medicine

## 2018-03-05 ENCOUNTER — Other Ambulatory Visit: Payer: Self-pay

## 2018-03-05 ENCOUNTER — Encounter: Payer: Medicare Other | Attending: Physician Assistant | Admitting: Physician Assistant

## 2018-03-05 DIAGNOSIS — Z8249 Family history of ischemic heart disease and other diseases of the circulatory system: Secondary | ICD-10-CM | POA: Insufficient documentation

## 2018-03-05 DIAGNOSIS — R0902 Hypoxemia: Secondary | ICD-10-CM

## 2018-03-05 DIAGNOSIS — I110 Hypertensive heart disease with heart failure: Secondary | ICD-10-CM | POA: Insufficient documentation

## 2018-03-05 DIAGNOSIS — J9621 Acute and chronic respiratory failure with hypoxia: Secondary | ICD-10-CM | POA: Diagnosis present

## 2018-03-05 DIAGNOSIS — H9191 Unspecified hearing loss, right ear: Secondary | ICD-10-CM | POA: Diagnosis present

## 2018-03-05 DIAGNOSIS — J449 Chronic obstructive pulmonary disease, unspecified: Secondary | ICD-10-CM | POA: Insufficient documentation

## 2018-03-05 DIAGNOSIS — Z87891 Personal history of nicotine dependence: Secondary | ICD-10-CM | POA: Insufficient documentation

## 2018-03-05 DIAGNOSIS — G4730 Sleep apnea, unspecified: Secondary | ICD-10-CM | POA: Insufficient documentation

## 2018-03-05 DIAGNOSIS — L8990 Pressure ulcer of unspecified site, unspecified stage: Secondary | ICD-10-CM

## 2018-03-05 DIAGNOSIS — I959 Hypotension, unspecified: Secondary | ICD-10-CM

## 2018-03-05 DIAGNOSIS — Z794 Long term (current) use of insulin: Secondary | ICD-10-CM

## 2018-03-05 DIAGNOSIS — E1151 Type 2 diabetes mellitus with diabetic peripheral angiopathy without gangrene: Secondary | ICD-10-CM | POA: Diagnosis present

## 2018-03-05 DIAGNOSIS — I499 Cardiac arrhythmia, unspecified: Secondary | ICD-10-CM | POA: Insufficient documentation

## 2018-03-05 DIAGNOSIS — N183 Chronic kidney disease, stage 3 (moderate): Secondary | ICD-10-CM | POA: Diagnosis present

## 2018-03-05 DIAGNOSIS — J9601 Acute respiratory failure with hypoxia: Secondary | ICD-10-CM | POA: Diagnosis present

## 2018-03-05 DIAGNOSIS — R001 Bradycardia, unspecified: Secondary | ICD-10-CM

## 2018-03-05 DIAGNOSIS — I4891 Unspecified atrial fibrillation: Secondary | ICD-10-CM | POA: Insufficient documentation

## 2018-03-05 DIAGNOSIS — D638 Anemia in other chronic diseases classified elsewhere: Secondary | ICD-10-CM | POA: Diagnosis present

## 2018-03-05 DIAGNOSIS — Z79890 Hormone replacement therapy (postmenopausal): Secondary | ICD-10-CM

## 2018-03-05 DIAGNOSIS — Z79899 Other long term (current) drug therapy: Secondary | ICD-10-CM

## 2018-03-05 DIAGNOSIS — I509 Heart failure, unspecified: Secondary | ICD-10-CM | POA: Insufficient documentation

## 2018-03-05 DIAGNOSIS — C4442 Squamous cell carcinoma of skin of scalp and neck: Secondary | ICD-10-CM | POA: Diagnosis present

## 2018-03-05 DIAGNOSIS — L89890 Pressure ulcer of other site, unstageable: Secondary | ICD-10-CM | POA: Insufficient documentation

## 2018-03-05 DIAGNOSIS — Z6825 Body mass index (BMI) 25.0-25.9, adult: Secondary | ICD-10-CM

## 2018-03-05 DIAGNOSIS — Z23 Encounter for immunization: Secondary | ICD-10-CM

## 2018-03-05 DIAGNOSIS — E1122 Type 2 diabetes mellitus with diabetic chronic kidney disease: Secondary | ICD-10-CM | POA: Diagnosis present

## 2018-03-05 DIAGNOSIS — Z823 Family history of stroke: Secondary | ICD-10-CM | POA: Insufficient documentation

## 2018-03-05 DIAGNOSIS — S0003XA Contusion of scalp, initial encounter: Secondary | ICD-10-CM | POA: Diagnosis present

## 2018-03-05 DIAGNOSIS — R402412 Glasgow coma scale score 13-15, at arrival to emergency department: Secondary | ICD-10-CM | POA: Diagnosis present

## 2018-03-05 DIAGNOSIS — Y92009 Unspecified place in unspecified non-institutional (private) residence as the place of occurrence of the external cause: Secondary | ICD-10-CM

## 2018-03-05 DIAGNOSIS — I2510 Atherosclerotic heart disease of native coronary artery without angina pectoris: Secondary | ICD-10-CM | POA: Diagnosis present

## 2018-03-05 DIAGNOSIS — W19XXXA Unspecified fall, initial encounter: Secondary | ICD-10-CM | POA: Diagnosis present

## 2018-03-05 DIAGNOSIS — J8410 Pulmonary fibrosis, unspecified: Principal | ICD-10-CM | POA: Diagnosis present

## 2018-03-05 DIAGNOSIS — Z923 Personal history of irradiation: Secondary | ICD-10-CM

## 2018-03-05 DIAGNOSIS — I129 Hypertensive chronic kidney disease with stage 1 through stage 4 chronic kidney disease, or unspecified chronic kidney disease: Secondary | ICD-10-CM | POA: Diagnosis present

## 2018-03-05 DIAGNOSIS — L98492 Non-pressure chronic ulcer of skin of other sites with fat layer exposed: Secondary | ICD-10-CM | POA: Insufficient documentation

## 2018-03-05 DIAGNOSIS — Z7951 Long term (current) use of inhaled steroids: Secondary | ICD-10-CM

## 2018-03-05 DIAGNOSIS — E1165 Type 2 diabetes mellitus with hyperglycemia: Secondary | ICD-10-CM | POA: Diagnosis present

## 2018-03-05 DIAGNOSIS — E43 Unspecified severe protein-calorie malnutrition: Secondary | ICD-10-CM

## 2018-03-05 DIAGNOSIS — C7951 Secondary malignant neoplasm of bone: Secondary | ICD-10-CM | POA: Diagnosis present

## 2018-03-05 DIAGNOSIS — E11622 Type 2 diabetes mellitus with other skin ulcer: Secondary | ICD-10-CM | POA: Insufficient documentation

## 2018-03-05 LAB — COMPREHENSIVE METABOLIC PANEL
ALBUMIN: 2.3 g/dL — AB (ref 3.5–5.0)
ALT: 15 U/L (ref 0–44)
ANION GAP: 9 (ref 5–15)
AST: 31 U/L (ref 15–41)
Alkaline Phosphatase: 64 U/L (ref 38–126)
BILIRUBIN TOTAL: 0.8 mg/dL (ref 0.3–1.2)
BUN: 27 mg/dL — AB (ref 8–23)
CO2: 27 mmol/L (ref 22–32)
Calcium: 8.2 mg/dL — ABNORMAL LOW (ref 8.9–10.3)
Chloride: 106 mmol/L (ref 98–111)
Creatinine, Ser: 1.56 mg/dL — ABNORMAL HIGH (ref 0.61–1.24)
GFR calc Af Amer: 45 mL/min — ABNORMAL LOW (ref >60)
GFR calc non Af Amer: 39 mL/min — ABNORMAL LOW (ref >60)
Glucose, Bld: 190 mg/dL — ABNORMAL HIGH (ref 70–99)
POTASSIUM: 4.8 mmol/L (ref 3.5–5.1)
SODIUM: 142 mmol/L (ref 135–145)
TOTAL PROTEIN: 7 g/dL (ref 6.5–8.1)

## 2018-03-05 LAB — LACTIC ACID, PLASMA: Lactic Acid, Venous: 2.1 mmol/L (ref 0.5–1.9)

## 2018-03-05 LAB — CBC
HEMATOCRIT: 38.8 % — AB (ref 39.0–52.0)
HEMOGLOBIN: 11.8 g/dL — AB (ref 13.0–17.0)
MCH: 28.4 pg (ref 26.0–34.0)
MCHC: 30.4 g/dL (ref 30.0–36.0)
MCV: 93.3 fL (ref 80.0–100.0)
NRBC: 0 % (ref 0.0–0.2)
Platelets: 165 10*3/uL (ref 150–400)
RBC: 4.16 MIL/uL — ABNORMAL LOW (ref 4.22–5.81)
RDW: 15.9 % — AB (ref 11.5–15.5)
WBC: 7.9 10*3/uL (ref 4.0–10.5)

## 2018-03-05 LAB — CK: Total CK: 23 U/L — ABNORMAL LOW (ref 49–397)

## 2018-03-05 LAB — TROPONIN I: Troponin I: <0.03 ng/mL (ref <0.03)

## 2018-03-05 MED ORDER — IOHEXOL 350 MG/ML SOLN
60.0000 mL | Freq: Once | INTRAVENOUS | Status: AC | PRN
  Administered 2018-03-05: 60 mL via INTRAVENOUS

## 2018-03-05 MED ORDER — SODIUM CHLORIDE 0.9 % IV BOLUS
1000.0000 mL | Freq: Once | INTRAVENOUS | Status: AC
  Administered 2018-03-05: 1000 mL via INTRAVENOUS

## 2018-03-05 NOTE — ED Notes (Signed)
Pt informed of needing urine sample when able 

## 2018-03-05 NOTE — ED Provider Notes (Addendum)
[facility] Emergency Department Provider Note  ____________________________________________  Time seen: Approximately 10:04 PM  I have reviewed the triage vital signs and the nursing notes.   HISTORY  Chief Complaint Fall    HPI Glenn Reeves is a 84 y.o. male a history of A. 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  The patient is unable to give a history for his fall, either due to dementia or altered mental status which is not clear at this time.  EMS reports that they were called by the family who heard the patient fall and when they came to find him, he was on the ground in the kitchen and briefly unresponsive.  Upon arrival, O2 sats were noted to be 75% on room air and improved to greater than 90 on 2 L nasal cannula.  He did not have a fever or abnormal blood pressure; he was mildly tachycardic.  At this time, the patient is not complaining of any pain.  Past Medical History:  Diagnosis Date  . Atrial fibrillation ([place])   . CAD (coronary artery disease)   . Cancer (HCC)    squamous cell - left ear  . Diabetes mellitus without complication ([place])   . GI bleed   . Hypertension   . Pulmonary fibrosis ([place])   . Thyroid disease     Patient Active Problem List   Diagnosis Date Noted  . Squamous cell carcinoma of skin 01/02/2018  . Acquired hypothyroidism 08/03/2016  . Atrial fibrillation and flutter ([place]) 08/03/2016  . CKD (chronic kidney disease) stage 3, GFR 30-59 ml/min (HCC) 08/03/2016  . Controlled type 2 diabetes mellitus with complication, without long-term current use of insulin ([place]) 08/03/2016  . Essential hypertension 08/03/2016  . MCI (mild cognitive impairment) with memory loss 08/03/2016  . Peripheral vascular disease ([place]) 08/03/2016  . Senile purpura ([place]) 08/03/2016  . Spinal stenosis of lumbar region without neurogenic claudication 08/03/2016  .  Thoracic aortic aneurysm without rupture ([place]) 08/03/2016  . Pulmonary fibrosis ([place]) 08/02/2016  . HCAP (healthcare-associated pneumonia)   . Generalized weakness   . Palliative care by specialist   . Goals of care, counseling/discussion   . Blood in stool   . Chronic duodenal ulcer with hemorrhage   . Gastritis without bleeding   . Sepsis ([place]) 06/26/2016  . Chronic obstructive pulmonary disease ([place]) 05/21/2016  . COPD (chronic obstructive pulmonary disease) ([place]) 02/28/2016  . OSA (obstructive sleep apnea) 02/28/2016    Past Surgical History:  Procedure Laterality Date  . ESOPHAGOGASTRODUODENOSCOPY (EGD) WITH PROPOFOL N/A 06/29/2016   Procedure: ESOPHAGOGASTRODUODENOSCOPY (EGD) WITH PROPOFOL;  Surgeon: [name], MD;  Location: [facility] ENDOSCOPY;  Service: Endoscopy;  Laterality: N/A;    Current Outpatient Rx  . Order #: [id number] Class: Historical Med  . Order #: [id number] Class: Historical Med  . Order #: [id number] Class: Historical Med  . Order #: [id number] Class: Historical Med  . Order #: [id number] Class: Historical Med  . Order #: [id number] Class: Historical Med  . Order #: [id number] Class: Historical Med  . Order #: [id number] Class: Historical Med  . Order #: [id number] Class: Historical Med  . Order #: [id number] Class: Historical Med  . Order #: [id number] Class: Historical Med  . Order #: [id number] Class: Historical Med    Allergies Codeine  Family History  Problem Relation Age of Onset  . Heart attack Mother   . Heart attack Father   . Heart disease Brother   . Stroke Brother   .  Autoimmune disease Neg Hx     Social History Social History   Tobacco Use  . Smoking status: Former Smoker    Types: Cigarettes    Last attempt to quit: 01/03/1988    Years since quitting: 30.1  . Smokeless tobacco: Never Used  . Tobacco comment: smoked in the winter  Substance Use Topics  . Alcohol use: No  . Drug use: No    Review of Systems Unable to obtain due to patient  altered mental status.   ____________________________________________   PHYSICAL EXAM:  VITAL SIGNS: ED Triage Vitals  Enc Vitals Group     BP 03/05/18 2200 (!) 128/95     Pulse Rate 03/05/18 2200 (!) 101     Resp 03/05/18 2200 (!) 24     Temp --      Temp src --      SpO2 03/05/18 2200 100 %     Weight 03/05/18 2153 170 lb 8 oz (77.3 kg)     Height 03/05/18 2153 5\' 8"  (1.727 m)     Head Circumference --      Peak Flow --      Pain Score --      Pain Loc --      Pain Edu? --      Excl. in [place]? --     Constitutional: Patient is alert and oriented to person and place but not year.  He is able to answer basic commands and answer basic questions.  GCS is 15. Eyes: Patient has proptosis of the left eye with incomplete lid closure.  No raccoon eyes. Head: Atraumatic.  Patient has what appears to be a chronic nonhealing wound over the left temporal area that has purulent discharge without any surrounding erythema or fluctuance.  He has old surgical changes to the left side of the face.  He has no battle sign. Nose: No congestion/rhinnorhea.  No swelling over the nose or septal hematoma. Mouth/Throat: Mucous membranes are dry.  No dental injury or malocclusion. Neck: No stridor.  Supple.  No JVD.  No midline C-spine tenderness to palpation, step-offs or do deformities. Cardiovascular: Normal rate, regular rhythm. No murmurs, rubs or gallops.  Respiratory: Normal respiratory effort.  No accessory muscle use or retractions. Lungs CTAB.  No wheezes, rales or ronchi. Gastrointestinal: Soft, nontender and nondistended.  No guarding or rebound.  No peritoneal signs. Musculoskeletal: Pelvis is stable.  Able to move all 4 extremities without difficulty.  No LE edema. No ttp in the calves or palpable cords.  Negative Homan's sign. Neurologic:  A&Ox2.  Speech is clear.  Face and smile are symmetric.  EOMI.  Moves all extremities well. Skin: See facial skin exam as above. Psychiatric: Mood and  affect are normal. ____________________________________________   LABS (all labs ordered are listed, but only abnormal results are displayed)  Labs Reviewed  CBC - Abnormal; Notable for the following components:      Result Value   RBC 4.16 (*)    Hemoglobin 11.8 (*)    HCT 38.8 (*)    RDW 15.9 (*)    All other components within normal limits  COMPREHENSIVE METABOLIC PANEL - Abnormal; Notable for the following components:   Glucose, Bld 190 (*)    BUN 27 (*)    Creatinine, Ser 1.56 (*)    Calcium 8.2 (*)    Albumin 2.3 (*)    GFR calc non Af Amer 39 (*)    GFR calc Af Amer 45 (*)  All other components within normal limits  CK - Abnormal; Notable for the following components:   Total CK 23 (*)    All other components within normal limits  BLOOD GAS, VENOUS - Abnormal; Notable for the following components:   pCO2, Ven 67 (*)    Bicarbonate 31.5 (*)    Acid-Base Excess 3.0 (*)    All other components within normal limits  LACTIC ACID, PLASMA - Abnormal; Notable for the following components:   Lactic Acid, Venous 2.1 (*)    All other components within normal limits  TROPONIN I  URINALYSIS, COMPLETE (UACMP) WITH MICROSCOPIC  LACTIC ACID, PLASMA   ____________________________________________  EKG  ED ECG REPORT I, Anne-Caroline Mariea Clonts, the attending physician, personally viewed and interpreted this ECG.   Date: 03/05/2018  EKG Time: 2151  Rate: 84  Rhythm: afib  Axis: leftward  Intervals:none  ST&T Change: No STEMI  Repeat EKG during CP: ED ECG REPORT I, Anne-Caroline Mariea Clonts, the attending physician, personally viewed and interpreted this ECG.   Date: 03/06/2018  EKG Time: 2o55  Rate: 73  Rhythm: normal sinus rhythm  Axis: normal  Intervals:none  ST&T Change: No STEMI   ____________________________________________  RADIOLOGY  Ct Head W Or Wo Contrast  Result Date: 03/05/2018 CLINICAL DATA:  Unwitnessed fall, found unresponsive. History of LEFT face  skin cancer, hypertension, diabetes, atrial fibrillation pulmonary fibrosis. EXAM: CT HEAD WITH AND WITHOUT CONTRAST CT CERVICAL SPINE WITHOUT CONTRAST TECHNIQUE: Multidetector CT imaging of the head and cervical spine was performed following the standard protocol with and without intravenous contrast. Multiplanar CT image reconstructions of the cervical spine were also generated. CONTRAST:  60 cc Omnipaque 350 COMPARISON:  CT HEAD and cervical spine December 26, 2017, PET-CT January 11, 2018. FINDINGS: CT HEAD FINDINGS BRAIN: No intraparenchymal hemorrhage. Stable 4 mm LEFT-to-RIGHT midline shift. The ventricles and sulci are normal for age. Patchy supratentorial white matter hypodensities less than expected for patient's age, though non-specific are most compatible with chronic small vessel ischemic disease. No acute large vascular territory infarcts. No abnormal extra-axial fluid collections. Basal cisterns are patent. No abnormal intracranial enhancement. VASCULAR: Severe calcific atherosclerosis of the carotid siphons. SKULL: No skull fracture.  Small LEFT parietal scalp hematoma. SINUSES/ORBITS: Trace paranasal sinus mucosal thickening. Trace RIGHT mastoid effusion. Soft tissue tumor invading LEFT extraconal fat, LEFT proptosis. Status post RIGHT ocular lens implant. OTHER: Partially imaged large destructive enhancing soft tissue mass within the LEFT periorbital, LEFT frontotemporal scalp with similar invasion of the LEFT frontal calvarium and LEFT frontal sinus. CT CERVICAL SPINE FINDINGS ALIGNMENT: Maintained lordosis. Vertebral bodies in alignment. SKULL BASE AND VERTEBRAE: Cervical vertebral bodies and posterior elements are intact. Severe C5-6, moderate to severe C4-5 and C6-7 disc height loss with endplate spurring and sclerosis consistent with degenerative disc. Bulky ventral osteophytes. No destructive bony lesions. C1-2 articulation maintained, moderate osteoarthrosis. C2-3 facets fused on  degenerative basis. Multilevel moderate facet arthropathy. SOFT TISSUES AND SPINAL CANAL: Nonacute. Multiple surgical clips LEFT neck resulting in streak artifact. Severe calcific atherosclerosis RIGHT and possibly LEFT carotid bifurcations. DISC LEVELS: Severe canal stenosis C5-6. Severe LEFT C4-5, bilateral C5-6 and C6-7 neural foraminal narrowing. UPPER CHEST: Biapical fibrosis and worsening LEFT apical consolidation. OTHER: None. IMPRESSION: CT HEAD: 1. No acute intracranial process. Small LEFT posterior scalp hematoma. 2. Similar partially imaged LEFT periorbital/LEFT facial infiltrative soft tissue tumor resulting in unchanged 4 mm LEFT to RIGHT midline shift, no CT findings of cerebral invasion. CT CERVICAL SPINE: 1. No fracture or malalignment. 2.  Severe canal stenosis C5-6. Severe C4-5 through C6-7 neural foraminal narrowing. 3. Biapical fibrosis and worsening LEFT apical consolidation. Electronically Signed   By: Elon Alas M.D.   On: 03/05/2018 23:19   Ct Angio Chest Pe W And/or Wo Contrast  Result Date: 03/05/2018 CLINICAL DATA:  Unwitnessed fall. Patient found unresponsive with agonal breathing. EXAM: CT ANGIOGRAPHY CHEST WITH CONTRAST TECHNIQUE: Multidetector CT imaging of the chest was performed using the standard protocol during bolus administration of intravenous contrast. Multiplanar CT image reconstructions and MIPs were obtained to evaluate the vascular anatomy. CONTRAST:  57mL OMNIPAQUE IOHEXOL 350 MG/ML SOLN COMPARISON:  Chest CT 06/29/2016, head CT 01/11/2018 FINDINGS: Cardiovascular: 4.2 cm ascending thoracic aortic aneurysm, stable in appearance with diffuse moderate aortic atherosclerosis. Satisfactory opacification of the pulmonary arteries without acute pulmonary embolus. Cardiomegaly with coronary arteriosclerosis. No pericardial effusion or thickening. Mediastinum/Nodes: No enlarged mediastinal, hilar, or axillary lymph nodes. Thyroid gland, trachea, and esophagus  demonstrate no significant findings. Lungs/Pleura: Interstitial pulmonary fibrosis with traction bronchiectasis noted bilaterally.Trace pleural effusions left greater than right pleural thickening. Upper Abdomen: Multiple splenic and hepatic granulomata. Surgical clips from prior cholecystectomy. Musculoskeletal: Diffuse idiopathic skeletal hyperostosis. Review of the MIP images confirms the above findings. IMPRESSION: 1. No acute pulmonary embolus. 2. Stable 4.2 cm ascending thoracic aortic aneurysm. Recommend annual imaging followup by CTA or MRA. This recommendation follows 2010 ACCF/AHA/AATS/ACR/ASA/SCA/SCAI/SIR/STS/SVM Guidelines for the Diagnosis and Management of Patients with Thoracic Aortic Disease. 2010; 121: C789-F810. 3. Interstitial pulmonary fibrosis with traction bronchiectasis and trace pleural effusions. 4. Old granulomatous disease. Aortic aneurysm NOS (ICD10-I71.9). Aortic Atherosclerosis (ICD10-I70.0). Electronically Signed   By: Ashley Royalty M.D.   On: 03/05/2018 23:29   Ct Cervical Spine Wo Contrast  Result Date: 03/05/2018 CLINICAL DATA:  Unwitnessed fall, found unresponsive. History of LEFT face skin cancer, hypertension, diabetes, atrial fibrillation pulmonary fibrosis. EXAM: CT HEAD WITH AND WITHOUT CONTRAST CT CERVICAL SPINE WITHOUT CONTRAST TECHNIQUE: Multidetector CT imaging of the head and cervical spine was performed following the standard protocol with and without intravenous contrast. Multiplanar CT image reconstructions of the cervical spine were also generated. CONTRAST:  60 cc Omnipaque 350 COMPARISON:  CT HEAD and cervical spine December 26, 2017, PET-CT January 11, 2018. FINDINGS: CT HEAD FINDINGS BRAIN: No intraparenchymal hemorrhage. Stable 4 mm LEFT-to-RIGHT midline shift. The ventricles and sulci are normal for age. Patchy supratentorial white matter hypodensities less than expected for patient's age, though non-specific are most compatible with chronic small vessel  ischemic disease. No acute large vascular territory infarcts. No abnormal extra-axial fluid collections. Basal cisterns are patent. No abnormal intracranial enhancement. VASCULAR: Severe calcific atherosclerosis of the carotid siphons. SKULL: No skull fracture.  Small LEFT parietal scalp hematoma. SINUSES/ORBITS: Trace paranasal sinus mucosal thickening. Trace RIGHT mastoid effusion. Soft tissue tumor invading LEFT extraconal fat, LEFT proptosis. Status post RIGHT ocular lens implant. OTHER: Partially imaged large destructive enhancing soft tissue mass within the LEFT periorbital, LEFT frontotemporal scalp with similar invasion of the LEFT frontal calvarium and LEFT frontal sinus. CT CERVICAL SPINE FINDINGS ALIGNMENT: Maintained lordosis. Vertebral bodies in alignment. SKULL BASE AND VERTEBRAE: Cervical vertebral bodies and posterior elements are intact. Severe C5-6, moderate to severe C4-5 and C6-7 disc height loss with endplate spurring and sclerosis consistent with degenerative disc. Bulky ventral osteophytes. No destructive bony lesions. C1-2 articulation maintained, moderate osteoarthrosis. C2-3 facets fused on degenerative basis. Multilevel moderate facet arthropathy. SOFT TISSUES AND SPINAL CANAL: Nonacute. Multiple surgical clips LEFT neck resulting in streak artifact. Severe calcific atherosclerosis RIGHT  and possibly LEFT carotid bifurcations. DISC LEVELS: Severe canal stenosis C5-6. Severe LEFT C4-5, bilateral C5-6 and C6-7 neural foraminal narrowing. UPPER CHEST: Biapical fibrosis and worsening LEFT apical consolidation. OTHER: None. IMPRESSION: CT HEAD: 1. No acute intracranial process. Small LEFT posterior scalp hematoma. 2. Similar partially imaged LEFT periorbital/LEFT facial infiltrative soft tissue tumor resulting in unchanged 4 mm LEFT to RIGHT midline shift, no CT findings of cerebral invasion. CT CERVICAL SPINE: 1. No fracture or malalignment. 2. Severe canal stenosis C5-6. Severe C4-5 through  C6-7 neural foraminal narrowing. 3. Biapical fibrosis and worsening LEFT apical consolidation. Electronically Signed   By: Elon Alas M.D.   On: 03/05/2018 23:19   Dg Chest Portable 1 View  Result Date: 03/05/2018 CLINICAL DATA:  Unwitnessed fall. EXAM: PORTABLE CHEST 1 VIEW COMPARISON:  12/26/2017 FINDINGS: The patient is slightly rotated to the right. There is mild cardiac enlargement in part due to portable technique and rotation. Chronic interstitial prominence consistent with interstitial lung disease is again noted with chronic elevation of right hemidiaphragm. No acute pulmonary consolidation, effusion or pneumothorax. No acute displaced rib fracture. Vascular clips are again noted at the base of the neck on left. IMPRESSION: 1. No active disease. 2. Diffuse interstitial prominence likely representing interstitial fibrosis and consistent with chronic interstitial lung disease is again noted with chronic elevation of right hemidiaphragm. Electronically Signed   By: Ashley Royalty M.D.   On: 03/05/2018 22:54    ____________________________________________   PROCEDURES  Procedure(s) performed: None  Procedures  Critical Care performed: Yes, see critical care note(s) ____________________________________________   INITIAL IMPRESSION / ASSESSMENT AND PLAN / ED COURSE  Pertinent labs & imaging results that were available during my care of the patient were reviewed by me and considered in my medical decision making (see chart for details).  84 y.o. male with squamous cell CA of the left face presenting for an unwitnessed syncopal episode without any obvious injury.  Overall, the patient is hemodynamically stable and now has a normal heart rate; he is a in A. fib but this is chronic for him.  We will look for causes of his syncope including hypovolemia or dehydration, arrhythmia that is intermittent or abnormality in his atrial fibrillation, intracranial abnormality including bleed.  I am  concerned that he has an infection at his open wound source.  We will also rule out UTI.  Given his cancer, I am also concerned about PE and a CT scan has been ordered to evaluate for this.  The patient will undergo full trauma and syncope evaluation and be admitted to the hospital for continued evaluation and treatment.  ----------------------------------------- 11:19 PM on 03/05/2018 -----------------------------------------  I have spoken with the patient's daughter and son-in-law, with whom he lives.  They report that he has been on a brief hiatus from radiation to the left temporal squamous cell cancer but that he was seen at wound clinic within the last 2 days And It Appears to Be Improving; Purulent Discharge Is Normal.  They Describe they heard him fall and immediately he began to call for help; he did not necessarily fall because of syncope.  However, when they got him into a wheelchair, he slumped forward and did become unresponsive.  At home, he was found to be hypoxic to the 70s and had a heart rate that dipped as low as 26.  They do report that 3 days ago he was hypoxic to 84% at home, but this seemed to resolve; they feel he may need  supplemental O2 at home.  He has not had any other recent illness or medication changes.  At this time, the patient's laboratory studies show hypercarbia with a normal pH, stable hemoglobin, normal white blood cell count.  He does have a creatinine of 1.56 which is stable or better than usual for him.  His troponin is negative.  I am awaiting the results of his CT imaging at but his portable chest film does not show any active disease.  This time, the patient continues to state that he does not have any pain.  He has a blood pressure of 90/52, which the family states is not far from his baseline.  He is receiving intravenous fluids.   CRITICAL CARE Performed by: Eula Listen   Total critical care time: 40 minutes  Critical care time was exclusive  of separately billable procedures and treating other patients.  Critical care was necessary to treat or prevent imminent or life-threatening deterioration.  Critical care was time spent personally by me on the following activities: development of treatment plan with patient and/or surrogate as well as nursing, discussions with consultants, evaluation of patient's response to treatment, examination of patient, obtaining history from patient or surrogate, ordering and performing treatments and interventions, ordering and review of laboratory studies, ordering and review of radiographic studies, pulse oximetry and re-evaluation of patient's condition.  ----------------------------------------- 12:23 AM on 03/06/2018 -----------------------------------------  The patient's trauma work-up is reassuring and he has no evidence of injury other than a scalp hematoma from his fall.  He has no new intracranial findings; he has old findings related to his squamous cell cancer including midline shift of the brain which is unchanged.  The patient has no evidence of injury to his C-spine.  His chest CT does not show any evidence of PE.  At this time, we will plan to admit the patient for continued evaluation and treatment.  ____________________________________________  FINAL CLINICAL IMPRESSION(S) / ED DIAGNOSES  Final diagnoses:  Hypoxia  Hypotension, unspecified hypotension type  Fall, initial encounter  Bradycardia  Hematoma of scalp, initial encounter         NEW MEDICATIONS STARTED DURING THIS VISIT:  New Prescriptions   No medications on file      Eula Listen, MD 03/06/18 0024    Eula Listen, MD 03/06/18 [phone]

## 2018-03-05 NOTE — ED Notes (Signed)
Patient transported to CT 

## 2018-03-05 NOTE — ED Notes (Signed)
Family at bedside. Pt updated on pt's plan of care at this time.

## 2018-03-05 NOTE — ED Notes (Signed)
Report given to Ramond Dial and Butch Penny RN

## 2018-03-05 NOTE — ED Notes (Signed)
Pt back from CT

## 2018-03-05 NOTE — ED Triage Notes (Addendum)
Pt come via [facility] from home after unwitnessed fall. Per EMS family heard pt fall onto marble floor. Pt was found unresponsive, agonal breathing, 75% Room air HR-44 the dropped to 20s. BP-90/60.   EMS reports pt went into afib 80-144 HR, 99% room air, respirations 12. BS-212.  Pt arrived and is alert but doesn't recall what happened. Pt has cancer noted to left side of head and eye. Redness noted. Per family it is Squamous cell Carcinoma of the skin.

## 2018-03-05 NOTE — ED Notes (Signed)
MD at bedside. 2L O2 applied per MD Mariea Clonts

## 2018-03-06 ENCOUNTER — Other Ambulatory Visit: Payer: Self-pay

## 2018-03-06 ENCOUNTER — Inpatient Hospital Stay
Admit: 2018-03-06 | Discharge: 2018-03-06 | Disposition: A | Discharge status: Home / Self Care | Payer: Medicare Other | Attending: Internal Medicine | Admitting: Internal Medicine

## 2018-03-06 DIAGNOSIS — H9191 Unspecified hearing loss, right ear: Secondary | ICD-10-CM | POA: Diagnosis present

## 2018-03-06 DIAGNOSIS — Y92009 Unspecified place in unspecified non-institutional (private) residence as the place of occurrence of the external cause: Secondary | ICD-10-CM | POA: Diagnosis not present

## 2018-03-06 DIAGNOSIS — Z8249 Family history of ischemic heart disease and other diseases of the circulatory system: Secondary | ICD-10-CM | POA: Diagnosis not present

## 2018-03-06 DIAGNOSIS — C4442 Squamous cell carcinoma of skin of scalp and neck: Secondary | ICD-10-CM | POA: Diagnosis present

## 2018-03-06 DIAGNOSIS — E1165 Type 2 diabetes mellitus with hyperglycemia: Secondary | ICD-10-CM | POA: Diagnosis present

## 2018-03-06 DIAGNOSIS — J449 Chronic obstructive pulmonary disease, unspecified: Secondary | ICD-10-CM | POA: Diagnosis present

## 2018-03-06 DIAGNOSIS — Z23 Encounter for immunization: Secondary | ICD-10-CM | POA: Diagnosis present

## 2018-03-06 DIAGNOSIS — E43 Unspecified severe protein-calorie malnutrition: Secondary | ICD-10-CM

## 2018-03-06 DIAGNOSIS — I129 Hypertensive chronic kidney disease with stage 1 through stage 4 chronic kidney disease, or unspecified chronic kidney disease: Secondary | ICD-10-CM | POA: Diagnosis present

## 2018-03-06 DIAGNOSIS — E1151 Type 2 diabetes mellitus with diabetic peripheral angiopathy without gangrene: Secondary | ICD-10-CM | POA: Diagnosis present

## 2018-03-06 DIAGNOSIS — J9601 Acute respiratory failure with hypoxia: Secondary | ICD-10-CM | POA: Diagnosis present

## 2018-03-06 DIAGNOSIS — N183 Chronic kidney disease, stage 3 (moderate): Secondary | ICD-10-CM | POA: Diagnosis present

## 2018-03-06 DIAGNOSIS — Z79899 Other long term (current) drug therapy: Secondary | ICD-10-CM | POA: Diagnosis not present

## 2018-03-06 DIAGNOSIS — Z794 Long term (current) use of insulin: Secondary | ICD-10-CM | POA: Diagnosis not present

## 2018-03-06 DIAGNOSIS — J8410 Pulmonary fibrosis, unspecified: Secondary | ICD-10-CM | POA: Diagnosis present

## 2018-03-06 DIAGNOSIS — I2510 Atherosclerotic heart disease of native coronary artery without angina pectoris: Secondary | ICD-10-CM | POA: Diagnosis present

## 2018-03-06 DIAGNOSIS — Z7951 Long term (current) use of inhaled steroids: Secondary | ICD-10-CM | POA: Diagnosis not present

## 2018-03-06 DIAGNOSIS — E1122 Type 2 diabetes mellitus with diabetic chronic kidney disease: Secondary | ICD-10-CM | POA: Diagnosis present

## 2018-03-06 DIAGNOSIS — D638 Anemia in other chronic diseases classified elsewhere: Secondary | ICD-10-CM | POA: Diagnosis present

## 2018-03-06 DIAGNOSIS — R402412 Glasgow coma scale score 13-15, at arrival to emergency department: Secondary | ICD-10-CM | POA: Diagnosis present

## 2018-03-06 DIAGNOSIS — C7951 Secondary malignant neoplasm of bone: Secondary | ICD-10-CM | POA: Diagnosis present

## 2018-03-06 DIAGNOSIS — I4891 Unspecified atrial fibrillation: Secondary | ICD-10-CM | POA: Diagnosis present

## 2018-03-06 DIAGNOSIS — L8990 Pressure ulcer of unspecified site, unspecified stage: Secondary | ICD-10-CM

## 2018-03-06 DIAGNOSIS — R0602 Shortness of breath: Secondary | ICD-10-CM | POA: Diagnosis not present

## 2018-03-06 DIAGNOSIS — J9621 Acute and chronic respiratory failure with hypoxia: Secondary | ICD-10-CM | POA: Diagnosis present

## 2018-03-06 DIAGNOSIS — Z79890 Hormone replacement therapy (postmenopausal): Secondary | ICD-10-CM | POA: Diagnosis not present

## 2018-03-06 DIAGNOSIS — Z87891 Personal history of nicotine dependence: Secondary | ICD-10-CM | POA: Diagnosis not present

## 2018-03-06 DIAGNOSIS — W19XXXA Unspecified fall, initial encounter: Secondary | ICD-10-CM | POA: Diagnosis not present

## 2018-03-06 LAB — PHOSPHORUS: Phosphorus: 2.8 mg/dL (ref 2.5–4.6)

## 2018-03-06 LAB — URINALYSIS, COMPLETE (UACMP) WITH MICROSCOPIC
BILIRUBIN URINE: NEGATIVE
Bacteria, UA: NONE SEEN
Glucose, UA: NEGATIVE mg/dL
Hgb urine dipstick: NEGATIVE
KETONES UR: NEGATIVE mg/dL
LEUKOCYTES UA: NEGATIVE
Nitrite: NEGATIVE
PROTEIN: NEGATIVE mg/dL
Specific Gravity, Urine: >1.046 — ABNORMAL HIGH (ref 1.005–1.030)
pH: 5 (ref 5.0–8.0)

## 2018-03-06 LAB — MAGNESIUM: MAGNESIUM: 2.1 mg/dL (ref 1.7–2.4)

## 2018-03-06 LAB — GLUCOSE, CAPILLARY
GLUCOSE-CAPILLARY: 193 mg/dL — AB (ref 70–99)
GLUCOSE-CAPILLARY: 173 mg/dL — AB (ref 70–99)
Glucose-Capillary: 117 mg/dL — ABNORMAL HIGH (ref 70–99)
Glucose-Capillary: 188 mg/dL — ABNORMAL HIGH (ref 70–99)

## 2018-03-06 LAB — LACTIC ACID, PLASMA: Lactic Acid, Venous: 1.2 mmol/L (ref 0.5–1.9)

## 2018-03-06 LAB — TSH: TSH: 6.499 u[IU]/mL — ABNORMAL HIGH (ref 0.350–4.500)

## 2018-03-06 LAB — MRSA PCR SCREENING: MRSA by PCR: POSITIVE — AB

## 2018-03-06 LAB — T4, FREE: FREE T4: 1.25 ng/dL (ref 0.82–1.77)

## 2018-03-06 MED ORDER — CHLORHEXIDINE GLUCONATE CLOTH 2 % EX PADS
6.0000 | MEDICATED_PAD | Freq: Every day | CUTANEOUS | Status: DC
  Administered 2018-03-07 – 2018-03-08 (×2): 6 via TOPICAL

## 2018-03-06 MED ORDER — FINASTERIDE 5 MG PO TABS
5.0000 mg | ORAL_TABLET | Freq: Every day | ORAL | Status: DC
  Administered 2018-03-06 – 2018-03-08 (×3): 5 mg via ORAL
  Filled 2018-03-06 (×3): qty 1

## 2018-03-06 MED ORDER — LACTATED RINGERS IV SOLN
INTRAVENOUS | Status: AC
  Administered 2018-03-06: 03:00:00 via INTRAVENOUS

## 2018-03-06 MED ORDER — ACETAMINOPHEN 325 MG PO TABS: 650.0000 mg | ORAL_TABLET | Freq: Four times a day (QID) | ORAL | Status: DC | PRN

## 2018-03-06 MED ORDER — SENNOSIDES-DOCUSATE SODIUM 8.6-50 MG PO TABS: 1.0000 | ORAL_TABLET | Freq: Every evening | ORAL | Status: DC | PRN

## 2018-03-06 MED ORDER — OCUVITE-LUTEIN PO CAPS
1.0000 | ORAL_CAPSULE | Freq: Every day | ORAL | Status: DC
  Administered 2018-03-06 – 2018-03-08 (×3): 1 via ORAL
  Filled 2018-03-06 (×3): qty 1

## 2018-03-06 MED ORDER — CARVEDILOL 3.125 MG PO TABS
6.2500 mg | ORAL_TABLET | Freq: Two times a day (BID) | ORAL | Status: DC
  Administered 2018-03-06: 09:00:00 6.25 mg via ORAL
  Filled 2018-03-06: qty 2

## 2018-03-06 MED ORDER — PANTOPRAZOLE SODIUM 40 MG PO TBEC
40.0000 mg | DELAYED_RELEASE_TABLET | Freq: Every day | ORAL | Status: DC
  Administered 2018-03-06 – 2018-03-08 (×3): 40 mg via ORAL
  Filled 2018-03-06 (×3): qty 1

## 2018-03-06 MED ORDER — CALCIUM POLYCARBOPHIL 625 MG PO TABS
625.0000 mg | ORAL_TABLET | Freq: Every day | ORAL | Status: DC
  Administered 2018-03-06 – 2018-03-08 (×3): 625 mg via ORAL
  Filled 2018-03-06 (×3): qty 1

## 2018-03-06 MED ORDER — ENOXAPARIN SODIUM 40 MG/0.4ML ~~LOC~~ SOLN
40.0000 mg | SUBCUTANEOUS | Status: DC
  Administered 2018-03-06 – 2018-03-07 (×2): 40 mg via SUBCUTANEOUS
  Filled 2018-03-06 (×2): qty 0.4

## 2018-03-06 MED ORDER — INFLUENZA VAC SPLIT HIGH-DOSE 0.5 ML IM SUSY
0.5000 mL | PREFILLED_SYRINGE | INTRAMUSCULAR | Status: AC
  Administered 2018-03-07: 18:00:00 0.5 mL via INTRAMUSCULAR
  Filled 2018-03-06: qty 0.5

## 2018-03-06 MED ORDER — VITAMIN C 500 MG PO TABS
250.0000 mg | ORAL_TABLET | Freq: Two times a day (BID) | ORAL | Status: DC
  Administered 2018-03-06 – 2018-03-08 (×5): 250 mg via ORAL
  Filled 2018-03-06 (×5): qty 1

## 2018-03-06 MED ORDER — ONDANSETRON HCL 4 MG PO TABS: 4.0000 mg | ORAL_TABLET | Freq: Four times a day (QID) | ORAL | Status: DC | PRN

## 2018-03-06 MED ORDER — BISACODYL 5 MG PO TBEC: 5.0000 mg | DELAYED_RELEASE_TABLET | Freq: Every day | ORAL | Status: DC | PRN

## 2018-03-06 MED ORDER — ROSUVASTATIN CALCIUM 10 MG PO TABS
10.0000 mg | ORAL_TABLET | Freq: Every evening | ORAL | Status: DC
  Administered 2018-03-06 – 2018-03-07 (×2): 10 mg via ORAL
  Filled 2018-03-06 (×2): qty 1

## 2018-03-06 MED ORDER — TAMSULOSIN HCL 0.4 MG PO CAPS
0.4000 mg | ORAL_CAPSULE | Freq: Every day | ORAL | Status: DC
  Administered 2018-03-06 – 2018-03-08 (×3): 0.4 mg via ORAL
  Filled 2018-03-06 (×3): qty 1

## 2018-03-06 MED ORDER — INSULIN ASPART 100 UNIT/ML ~~LOC~~ SOLN
0.0000 [IU] | Freq: Three times a day (TID) | SUBCUTANEOUS | Status: DC
  Administered 2018-03-06 – 2018-03-07 (×3): 2 [IU] via SUBCUTANEOUS
  Administered 2018-03-08 (×2): 1 [IU] via SUBCUTANEOUS
  Filled 2018-03-06 (×5): qty 1

## 2018-03-06 MED ORDER — MUPIROCIN 2 % EX OINT
1.0000 "application " | TOPICAL_OINTMENT | Freq: Two times a day (BID) | CUTANEOUS | Status: DC
  Administered 2018-03-06 – 2018-03-08 (×5): 1 via NASAL
  Filled 2018-03-06: qty 22

## 2018-03-06 MED ORDER — MONTELUKAST SODIUM 10 MG PO TABS
10.0000 mg | ORAL_TABLET | Freq: Every day | ORAL | Status: DC
  Administered 2018-03-06 – 2018-03-08 (×3): 10 mg via ORAL
  Filled 2018-03-06 (×3): qty 1

## 2018-03-06 MED ORDER — IPRATROPIUM-ALBUTEROL 0.5-2.5 (3) MG/3ML IN SOLN
3.0000 mL | Freq: Two times a day (BID) | RESPIRATORY_TRACT | Status: DC
  Administered 2018-03-07 – 2018-03-08 (×3): 3 mL via RESPIRATORY_TRACT
  Filled 2018-03-06 (×3): qty 3

## 2018-03-06 MED ORDER — LEVOTHYROXINE SODIUM 50 MCG PO TABS
75.0000 ug | ORAL_TABLET | Freq: Every day | ORAL | Status: DC
  Administered 2018-03-06 – 2018-03-08 (×3): 75 ug via ORAL
  Filled 2018-03-06 (×3): qty 1

## 2018-03-06 MED ORDER — CARVEDILOL 3.125 MG PO TABS
3.1250 mg | ORAL_TABLET | Freq: Two times a day (BID) | ORAL | Status: DC
  Administered 2018-03-06 – 2018-03-08 (×4): 3.125 mg via ORAL
  Filled 2018-03-06 (×4): qty 1

## 2018-03-06 MED ORDER — ENSURE ENLIVE PO LIQD
237.0000 mL | Freq: Two times a day (BID) | ORAL | Status: DC
  Administered 2018-03-06 – 2018-03-08 (×4): 237 mL via ORAL

## 2018-03-06 MED ORDER — PREDNISONE 20 MG PO TABS: 40.0000 mg | ORAL_TABLET | Freq: Every day | ORAL | Status: DC

## 2018-03-06 MED ORDER — ACETAMINOPHEN 650 MG RE SUPP: 650.0000 mg | Freq: Four times a day (QID) | RECTAL | Status: DC | PRN

## 2018-03-06 MED ORDER — ONDANSETRON HCL 4 MG/2ML IJ SOLN: 4.0000 mg | Freq: Four times a day (QID) | INTRAMUSCULAR | Status: DC | PRN

## 2018-03-06 MED ORDER — IPRATROPIUM-ALBUTEROL 0.5-2.5 (3) MG/3ML IN SOLN
3.0000 mL | Freq: Four times a day (QID) | RESPIRATORY_TRACT | Status: AC
  Administered 2018-03-06 (×3): 3 mL via RESPIRATORY_TRACT
  Filled 2018-03-06 (×3): qty 3

## 2018-03-06 MED ORDER — MORPHINE SULFATE (PF) 2 MG/ML IV SOLN: 2.0000 mg | INTRAVENOUS | Status: DC | PRN

## 2018-03-06 MED ORDER — METHYLPREDNISOLONE SODIUM SUCC 125 MG IJ SOLR
60.0000 mg | Freq: Two times a day (BID) | INTRAMUSCULAR | Status: AC
  Administered 2018-03-06 (×2): 60 mg via INTRAVENOUS
  Filled 2018-03-06 (×2): qty 2

## 2018-03-06 NOTE — H&P (Addendum)
[facility] NAME: Glenn Reeves    MR#:  [id number]  DATE OF BIRTH:  [birth year]  DATE OF ADMISSION:  03/05/2018  PRIMARY CARE PHYSICIAN: Adin Hector, MD   REQUESTING/REFERRING PHYSICIAN: Eula Listen, MD  CHIEF COMPLAINT:   Chief Complaint  Patient presents with  . Fall    HISTORY OF PRESENT ILLNESS:  Glenn Reeves  is a 84 y.o. male with a known history of L temporal SCC (radiation), T2IDDM, pulmonary fibrosis p/w fall, acute on chronic hypoxemic respiratory failure. Pt is not on home oxygen. He ambulates w/ walker @ baseline. Has L head wound, chronic. Pt is severely hard of hearing (R ear is the better ear).  Hx/ROS from daughter at bedside. Pt went to bed @~2000PM, got up @~2130PM to use bathroom. Daughter heard pt fall and then call out. Pt found down in hallway. Helped get pt up into wheelchair. Had an episode of unresponsiveness. EMS called. Hypoxic to 70% range. In ED, pt AAOx2 (not oriented to year, says 1999), but can tell me POTUS is Trump. Daughter states pt AAOx3 @ baseline. Pt is otherwise alert and able to answer questions/follow commands. EF 50% as of 07/2016. SpO2 98% on 2L [place] at time of my assessment; pt appears comfortable, in no distress. Diffuse fine crackles on lung exam, (-) wheezing.  PAST MEDICAL HISTORY:   Past Medical History:  Diagnosis Date  . Atrial fibrillation ([place])   . CAD (coronary artery disease)   . Cancer (HCC)    squamous cell - left ear  . Diabetes mellitus without complication ([place])   . GI bleed   . Hypertension   . Pulmonary fibrosis ([place])   . Thyroid disease     PAST SURGICAL HISTORY:   Past Surgical History:  Procedure Laterality Date  . ESOPHAGOGASTRODUODENOSCOPY (EGD) WITH PROPOFOL N/A 06/29/2016   Procedure: ESOPHAGOGASTRODUODENOSCOPY (EGD) WITH PROPOFOL;  Surgeon: [name], MD;  Location: [facility] ENDOSCOPY;  Service: Endoscopy;  Laterality: N/A;     SOCIAL HISTORY:   Social History   Tobacco Use  . Smoking status: Former Smoker    Types: Cigarettes    Last attempt to quit: 01/03/1988    Years since quitting: 30.1  . Smokeless tobacco: Never Used  . Tobacco comment: smoked in the winter  Substance Use Topics  . Alcohol use: No    FAMILY HISTORY:   Family History  Problem Relation Age of Onset  . Heart attack Mother   . Heart attack Father   . Heart disease Brother   . Stroke Brother   . Autoimmune disease Neg Hx     DRUG ALLERGIES:   Allergies  Allergen Reactions  . Codeine Rash    REVIEW OF SYSTEMS:   Review of Systems  Unable to perform ROS: Age  Musculoskeletal: Positive for falls.   Pt severely hard of hearing. MEDICATIONS AT HOME:   Prior to Admission medications   Medication Sig Start Date End Date Taking? Authorizing Provider  carvedilol (COREG) 6.25 MG tablet Take 6.25 mg by mouth 2 (two) times daily. 06/13/16  Yes [provider]  finasteride (PROSCAR) 5 MG tablet Take 5 mg by mouth daily. 06/07/16  Yes [provider]  ipratropium-albuterol (DUONEB) 0.5-2.5 (3) MG/3ML SOLN Inhale 3 mLs into the lungs every evening.    Yes [provider]  levothyroxine (SYNTHROID, LEVOTHROID) 75 MCG tablet Take 75 mcg by mouth daily. 06/07/16  Yes [provider]  montelukast (SINGULAIR) 10 MG tablet Take 1 tablet by mouth daily. 06/07/16  Yes [provider]  pantoprazole (PROTONIX) 40 MG tablet Take 40 mg by mouth daily.   Yes [provider]  polycarbophil (FIBERCON) 625 MG tablet Take 625 mg by mouth daily.    Yes [provider]  rosuvastatin (CRESTOR) 20 MG tablet Take 10 mg by mouth every evening.    Yes [provider]  tamsulosin (FLOMAX) 0.4 MG CAPS capsule Take 0.4 mg by mouth daily.    Yes [provider]  furosemide (LASIX) 20 MG tablet Take 20 mg by mouth every other day.     [provider]  HUMALOG KWIKPEN 100  UNIT/ML KiwkPen Inject 12 Units into the skin See admin instructions. Inject 12u under the skin with meals as directed for blood sugar over 150    [provider]  LEVEMIR FLEXTOUCH 100 UNIT/ML Pen Inject 5 Units into the skin at bedtime.     [provider]      VITAL SIGNS:  Blood pressure 94/62, pulse 75, temperature 97.7 F (36.5 C), temperature source Oral, resp. rate (!) 28, height 5\' 8"  (1.727 m), weight 77.3 kg, SpO2 98 %.  PHYSICAL EXAMINATION:  Physical Exam  Constitutional: He appears well-developed and well-nourished. He is active and cooperative.  Non-toxic appearance. No distress. He is not intubated. Nasal cannula in place.  HENT:  Mouth/Throat: Oropharynx is clear and moist. No oropharyngeal exudate.  L temporal scalp bandage (chronic wound).  Eyes: Conjunctivae, EOM and lids are normal. No scleral icterus.  Neck: Neck supple. No JVD present. No thyromegaly present.  Cardiovascular: Normal rate, regular rhythm, S1 normal, S2 normal and normal heart sounds.  No extrasystoles are present. Exam reveals no gallop, no S3, no S4, no distant heart sounds and no friction rub.  No murmur heard. Pulmonary/Chest: Effort normal. No accessory muscle usage or stridor. Tachypnea noted. No apnea and no bradypnea. He is not intubated. No respiratory distress. He has decreased breath sounds in the right upper field, the right middle field, the right lower field, the left upper field, the left middle field and the left lower field. He has no wheezes. He has no rhonchi. He has rales in the right upper field, the right middle field, the right lower field, the left upper field, the left middle field and the left lower field.  Abdominal: Soft. He exhibits no distension. Bowel sounds are decreased. There is no tenderness. There is no rigidity, no rebound and no guarding.  Musculoskeletal: Normal range of motion. He exhibits no edema or tenderness.  Lymphadenopathy:    He has no  cervical adenopathy.  Neurological: He is alert.  AAOx2, not oriented to year (as per HPI). Severely hard of hearing.  Skin: Skin is warm and dry. He is not diaphoretic. No erythema. No pallor.  Psychiatric: He has a normal mood and affect. His speech is normal and behavior is normal. Judgment and thought content normal. Cognition and memory are impaired.   L eye bulging. Had gotten worse 2/2 radiation, but has more recently been improving per pt's daughter. LABORATORY PANEL:   CBC Recent Labs  Lab 03/05/18 2154  WBC 7.9  HGB 11.8*  HCT 38.8*  PLT 165   ------------------------------------------------------------------------------------------------------------------  Chemistries  Recent Labs  Lab 03/05/18 2154  NA 142  K 4.8  CL 106  CO2 27  GLUCOSE 190*  BUN 27*  CREATININE 1.56*  CALCIUM 8.2*  AST 31  ALT 15  ALKPHOS 64  BILITOT 0.8   ------------------------------------------------------------------------------------------------------------------  Cardiac Enzymes Recent Labs  Lab 03/05/18 2154  TROPONINI <0.03   ------------------------------------------------------------------------------------------------------------------  RADIOLOGY:  Ct Head W Or Wo Contrast  Result Date: 03/05/2018 CLINICAL DATA:  Unwitnessed fall, found unresponsive. History of LEFT face skin cancer, hypertension, diabetes, atrial fibrillation pulmonary fibrosis. EXAM: CT HEAD WITH AND WITHOUT CONTRAST CT CERVICAL SPINE WITHOUT CONTRAST TECHNIQUE: Multidetector CT imaging of the head and cervical spine was performed following the standard protocol with and without intravenous contrast. Multiplanar CT image reconstructions of the cervical spine were also generated. CONTRAST:  60 cc Omnipaque 350 COMPARISON:  CT HEAD and cervical spine December 26, 2017, PET-CT January 11, 2018. FINDINGS: CT HEAD FINDINGS BRAIN: No intraparenchymal hemorrhage. Stable 4 mm LEFT-to-RIGHT midline shift. The  ventricles and sulci are normal for age. Patchy supratentorial white matter hypodensities less than expected for patient's age, though non-specific are most compatible with chronic small vessel ischemic disease. No acute large vascular territory infarcts. No abnormal extra-axial fluid collections. Basal cisterns are patent. No abnormal intracranial enhancement. VASCULAR: Severe calcific atherosclerosis of the carotid siphons. SKULL: No skull fracture.  Small LEFT parietal scalp hematoma. SINUSES/ORBITS: Trace paranasal sinus mucosal thickening. Trace RIGHT mastoid effusion. Soft tissue tumor invading LEFT extraconal fat, LEFT proptosis. Status post RIGHT ocular lens implant. OTHER: Partially imaged large destructive enhancing soft tissue mass within the LEFT periorbital, LEFT frontotemporal scalp with similar invasion of the LEFT frontal calvarium and LEFT frontal sinus. CT CERVICAL SPINE FINDINGS ALIGNMENT: Maintained lordosis. Vertebral bodies in alignment. SKULL BASE AND VERTEBRAE: Cervical vertebral bodies and posterior elements are intact. Severe C5-6, moderate to severe C4-5 and C6-7 disc height loss with endplate spurring and sclerosis consistent with degenerative disc. Bulky ventral osteophytes. No destructive bony lesions. C1-2 articulation maintained, moderate osteoarthrosis. C2-3 facets fused on degenerative basis. Multilevel moderate facet arthropathy. SOFT TISSUES AND SPINAL CANAL: Nonacute. Multiple surgical clips LEFT neck resulting in streak artifact. Severe calcific atherosclerosis RIGHT and possibly LEFT carotid bifurcations. DISC LEVELS: Severe canal stenosis C5-6. Severe LEFT C4-5, bilateral C5-6 and C6-7 neural foraminal narrowing. UPPER CHEST: Biapical fibrosis and worsening LEFT apical consolidation. OTHER: None. IMPRESSION: CT HEAD: 1. No acute intracranial process. Small LEFT posterior scalp hematoma. 2. Similar partially imaged LEFT periorbital/LEFT facial infiltrative soft tissue tumor  resulting in unchanged 4 mm LEFT to RIGHT midline shift, no CT findings of cerebral invasion. CT CERVICAL SPINE: 1. No fracture or malalignment. 2. Severe canal stenosis C5-6. Severe C4-5 through C6-7 neural foraminal narrowing. 3. Biapical fibrosis and worsening LEFT apical consolidation. Electronically Signed   By: Elon Alas M.D.   On: 03/05/2018 23:19   Ct Angio Chest Pe W And/or Wo Contrast  Result Date: 03/05/2018 CLINICAL DATA:  Unwitnessed fall. Patient found unresponsive with agonal breathing. EXAM: CT ANGIOGRAPHY CHEST WITH CONTRAST TECHNIQUE: Multidetector CT imaging of the chest was performed using the standard protocol during bolus administration of intravenous contrast. Multiplanar CT image reconstructions and MIPs were obtained to evaluate the vascular anatomy. CONTRAST:  18mL OMNIPAQUE IOHEXOL 350 MG/ML SOLN COMPARISON:  Chest CT 06/29/2016, head CT 01/11/2018 FINDINGS: Cardiovascular: 4.2 cm ascending thoracic aortic aneurysm, stable in appearance with diffuse moderate aortic atherosclerosis. Satisfactory opacification of the pulmonary arteries without acute pulmonary embolus. Cardiomegaly with coronary arteriosclerosis. No pericardial effusion or thickening. Mediastinum/Nodes: No enlarged mediastinal, hilar, or axillary lymph nodes. Thyroid gland, trachea, and esophagus demonstrate no significant findings. Lungs/Pleura: Interstitial pulmonary fibrosis with traction bronchiectasis noted bilaterally.Trace pleural effusions left greater than right  pleural thickening. Upper Abdomen: Multiple splenic and hepatic granulomata. Surgical clips from prior cholecystectomy. Musculoskeletal: Diffuse idiopathic skeletal hyperostosis. Review of the MIP images confirms the above findings. IMPRESSION: 1. No acute pulmonary embolus. 2. Stable 4.2 cm ascending thoracic aortic aneurysm. Recommend annual imaging followup by CTA or MRA. This recommendation follows 2010  ACCF/AHA/AATS/ACR/ASA/SCA/SCAI/SIR/STS/SVM Guidelines for the Diagnosis and Management of Patients with Thoracic Aortic Disease. 2010; 121: U045-W098. 3. Interstitial pulmonary fibrosis with traction bronchiectasis and trace pleural effusions. 4. Old granulomatous disease. Aortic aneurysm NOS (ICD10-I71.9). Aortic Atherosclerosis (ICD10-I70.0). Electronically Signed   By: Ashley Royalty M.D.   On: 03/05/2018 23:29   Ct Cervical Spine Wo Contrast  Result Date: 03/05/2018 CLINICAL DATA:  Unwitnessed fall, found unresponsive. History of LEFT face skin cancer, hypertension, diabetes, atrial fibrillation pulmonary fibrosis. EXAM: CT HEAD WITH AND WITHOUT CONTRAST CT CERVICAL SPINE WITHOUT CONTRAST TECHNIQUE: Multidetector CT imaging of the head and cervical spine was performed following the standard protocol with and without intravenous contrast. Multiplanar CT image reconstructions of the cervical spine were also generated. CONTRAST:  60 cc Omnipaque 350 COMPARISON:  CT HEAD and cervical spine December 26, 2017, PET-CT January 11, 2018. FINDINGS: CT HEAD FINDINGS BRAIN: No intraparenchymal hemorrhage. Stable 4 mm LEFT-to-RIGHT midline shift. The ventricles and sulci are normal for age. Patchy supratentorial white matter hypodensities less than expected for patient's age, though non-specific are most compatible with chronic small vessel ischemic disease. No acute large vascular territory infarcts. No abnormal extra-axial fluid collections. Basal cisterns are patent. No abnormal intracranial enhancement. VASCULAR: Severe calcific atherosclerosis of the carotid siphons. SKULL: No skull fracture.  Small LEFT parietal scalp hematoma. SINUSES/ORBITS: Trace paranasal sinus mucosal thickening. Trace RIGHT mastoid effusion. Soft tissue tumor invading LEFT extraconal fat, LEFT proptosis. Status post RIGHT ocular lens implant. OTHER: Partially imaged large destructive enhancing soft tissue mass within the LEFT periorbital,  LEFT frontotemporal scalp with similar invasion of the LEFT frontal calvarium and LEFT frontal sinus. CT CERVICAL SPINE FINDINGS ALIGNMENT: Maintained lordosis. Vertebral bodies in alignment. SKULL BASE AND VERTEBRAE: Cervical vertebral bodies and posterior elements are intact. Severe C5-6, moderate to severe C4-5 and C6-7 disc height loss with endplate spurring and sclerosis consistent with degenerative disc. Bulky ventral osteophytes. No destructive bony lesions. C1-2 articulation maintained, moderate osteoarthrosis. C2-3 facets fused on degenerative basis. Multilevel moderate facet arthropathy. SOFT TISSUES AND SPINAL CANAL: Nonacute. Multiple surgical clips LEFT neck resulting in streak artifact. Severe calcific atherosclerosis RIGHT and possibly LEFT carotid bifurcations. DISC LEVELS: Severe canal stenosis C5-6. Severe LEFT C4-5, bilateral C5-6 and C6-7 neural foraminal narrowing. UPPER CHEST: Biapical fibrosis and worsening LEFT apical consolidation. OTHER: None. IMPRESSION: CT HEAD: 1. No acute intracranial process. Small LEFT posterior scalp hematoma. 2. Similar partially imaged LEFT periorbital/LEFT facial infiltrative soft tissue tumor resulting in unchanged 4 mm LEFT to RIGHT midline shift, no CT findings of cerebral invasion. CT CERVICAL SPINE: 1. No fracture or malalignment. 2. Severe canal stenosis C5-6. Severe C4-5 through C6-7 neural foraminal narrowing. 3. Biapical fibrosis and worsening LEFT apical consolidation. Electronically Signed   By: Elon Alas M.D.   On: 03/05/2018 23:19   Dg Chest Portable 1 View  Result Date: 03/05/2018 CLINICAL DATA:  Unwitnessed fall. EXAM: PORTABLE CHEST 1 VIEW COMPARISON:  12/26/2017 FINDINGS: The patient is slightly rotated to the right. There is mild cardiac enlargement in part due to portable technique and rotation. Chronic interstitial prominence consistent with interstitial lung disease is again noted with chronic elevation of right hemidiaphragm. No  acute  pulmonary consolidation, effusion or pneumothorax. No acute displaced rib fracture. Vascular clips are again noted at the base of the neck on left. IMPRESSION: 1. No active disease. 2. Diffuse interstitial prominence likely representing interstitial fibrosis and consistent with chronic interstitial lung disease is again noted with chronic elevation of right hemidiaphragm. Electronically Signed   By: Ashley Royalty M.D.   On: 03/05/2018 22:54   IMPRESSION AND PLAN:   A/P: 34M w/ pulmonary fibrosis p/w fall, acute on chronic hypoxemic respiratory failure. Hyperglycemia (w/ T2IDDM), Cr elevation/CKD III, hypocalcemia, normocytic anemia. -Fall: CT head (-) acute intracranial abnl. CT C-spine (-) fracture/malalignment. CTA chest (-) PE. Fall precautions. -Acute on chronic hypoxemic respiratory failure: CTA chest (-) PE/infxn, (+) pulmonary fibrosis. SpO2 98% on 2L [place]. Lungs diminished w/ diffuse fine crackles, (-) wheezing. Does not appear to be having an acute COPD exacerbation as of my assessment, however I am not sure if pt was wheezing on arrival to ED. Nebs, steroids, incentive spirometry, pulmonary toileting. Ambulatory pulse oximetry to assess for home O2 appropriateness. -Hyperglycemia, T2IDDM: Pt's daughter states home long-acting and prandial insulins have been stopped due to recent hypoglycemia. SSI. -Cr elevation, CKD III: Cr 1.56, at baseline. Monitor BMP, avoid nephrotoxins. -Normocytic anemia: Likely anemia of chronic disease. No evidence of acute/active blood loss at present time. -c/w home meds/formulary subs. -FEN/GI: Cardiac diabetic diet. -DVT PPx: Lovenox. -Code status: Full code. -Disposition: Admission, > 2 midnights.   All the records are reviewed and case discussed with ED provider. Management plans discussed with the patient, family and they are in agreement.  CODE STATUS: Full code.  TOTAL TIME TAKING CARE OF THIS PATIENT: 75 minutes.    Arta Silence M.D on  03/06/2018 at 3:05 AM  Between 7am to 6pm - Pager - [phone]  After 6pm go to [email or web address] - password [email or web address] [facility]  [employer] [facility]  Office  [phone]  CC: Primary care physician; Adin Hector, MD   Note: This dictation was prepared with Dragon dictation along with smaller phrase technology. Any transcriptional errors that result from this process are unintentional.

## 2018-03-06 NOTE — Progress Notes (Signed)
*  PRELIMINARY RESULTS* Echocardiogram 2D Echocardiogram has been performed.  Sherrie Sport 03/06/2018, 2:43 PM

## 2018-03-06 NOTE — Progress Notes (Signed)
A work order was put into Recruitment consultant to have the patient's home Bipap checked.

## 2018-03-06 NOTE — Progress Notes (Signed)
Initial Nutrition Assessment  DOCUMENTATION CODES:   Severe malnutrition in context of chronic illness  INTERVENTION:   Ensure Enlive po BID, each supplement provides 350 kcal and 20 grams of protein  Magic cup TID with meals, each supplement provides 290 kcal and 9 grams of protein  Ocuvite daily for wound healing (provides zinc, vitamin A, vitamin C, Vitamin E, copper, and selenium)  Vitamin C 220m po BID  Liberalize diet  NUTRITION DIAGNOSIS:   Severe Malnutrition related to chronic illness(COPD, pulmonary fibrosis, SCC ) as evidenced by moderate fat depletion, moderate muscle depletion, 13 percent weight loss in <8 months.  GOAL:   Patient will meet greater than or equal to 90% of their needs  MONITOR:   PO intake, Supplement acceptance, Labs, Weight trends, Skin, I & O's  REASON FOR ASSESSMENT:   Malnutrition Screening Tool    ASSESSMENT:   84y.o. male a history of A. 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     Met with pt and pt's daughter in room today. Pt is a poor historian; reports good appetite and oral intake pta but pt's daughter at bedside reports pt with intermittent poor appetite. Per daughter, pt with very poor appetite and wt loss during XRT treatments but reports that pt's appetite has gotten better since taking a break over the past 2 weeks. Pt has been drinking Boost supplements at home on the days when his appetite is poor. Pt loves ice cream. Per chart, pt with 25lb(13%) wt loss in <8 months which is significant. Pt reports eating well at lunch today but daughter reports pt ate only a few bites of sweet potato.  Pt with Stage II hip wound that daughter reports gets worse during XRT treatments. 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 RD will also add supplements and  liberalize pt's dit to help pt meet his estimated needs. Ocuvite for wound healing. Suspect pt at refeed risk; recommend monitor K, Mg and P labs when oral intake improves.   Medications reviewed and include: lovenox, insulin, synthroid, protonix, fibercon  Labs reviewed: K 4.8 wnl, BUN 27(H), creat 1.56(H)- 11/12 P 2.8 wnl, Mg 2.1 wnl cbgs- 117, 188 x 24 hrs  NUTRITION - FOCUSED PHYSICAL EXAM:    Most Recent Value  Orbital Region  Moderate depletion  Upper Arm Region  Moderate depletion  Thoracic and Lumbar Region  Moderate depletion  Buccal Region  Moderate depletion  Temple Region  Moderate depletion  Clavicle Bone Region  Moderate depletion  Clavicle and Acromion Bone Region  Moderate depletion  Scapular Bone Region  Moderate depletion  Dorsal Hand  Moderate depletion  Patellar Region  Severe depletion  Anterior Thigh Region  Moderate depletion  Posterior Calf Region  Severe depletion  Edema (RD Assessment)  None  Hair  Reviewed  Eyes  Reviewed  Mouth  Reviewed  Skin  Reviewed [[email or web address]]  Nails  Reviewed     Diet Order:   Diet Order            Diet Carb Modified Fluid consistency: Thin; Room service appropriate? Yes  Diet effective now             EDUCATION NEEDS:   Education needs have been addressed  Skin:  Skin Assessment: Reviewed RN Assessment(Stage II hip)  Last BM:  pta  Height:   Ht Readings from Last 1 Encounters:  03/05/18 '5\' 8"'  (1.727 m)    Weight:   Wt Readings from Last 1 Encounters:  03/05/18 77.3 kg    Ideal Body Weight:  70 kg  BMI:  Body mass index is 25.92 kg/m.  Estimated Nutritional Needs:   Kcal:  1900-2200kcal/day   Protein:  100-116g/day   Fluid:  >1.8L/day   Koleen Distance MS, RD, LDN Pager #- [phone] Office#- [phone] After Hours Pager: [phone]

## 2018-03-06 NOTE — Progress Notes (Addendum)
Patient admitted overnight with acute hypoxic respiratory failure likely secondary to pulmonary fibrosis flare.  Seen and examined by me this morning.  States he is feeling a little bit better from a respiratory perspective.  Has not had any shortness of breath, but also notes that he has not been up and walking around.  His dyspnea is typically worse with exertion.  No cough, no fevers, no chills  On exam, he has diffuse inspiratory and expiratory crackles.  No wheezing or rhonchi.  Nasal cannula in place.  -Agree with admission H&P -Continue IV steroids -Check ECHO, as it sounds like patient had a syncopal event at home -Wean oxygen as able -Will ambulate with pulse ox prior to discharge.  Anticipate that patient will likely require oxygen at home moving forward. -TSH noted to be high, will obtain T3 and T4  Hyman Bible, MD

## 2018-03-07 LAB — GLUCOSE, CAPILLARY
GLUCOSE-CAPILLARY: 91 mg/dL (ref 70–99)
Glucose-Capillary: 113 mg/dL — ABNORMAL HIGH (ref 70–99)
Glucose-Capillary: 155 mg/dL — ABNORMAL HIGH (ref 70–99)
Glucose-Capillary: 227 mg/dL — ABNORMAL HIGH (ref 70–99)

## 2018-03-07 LAB — BASIC METABOLIC PANEL
Anion gap: 5 (ref 5–15)
BUN: 28 mg/dL — AB (ref 8–23)
CALCIUM: 8.2 mg/dL — AB (ref 8.9–10.3)
CO2: 29 mmol/L (ref 22–32)
CREATININE: 1.42 mg/dL — AB (ref 0.61–1.24)
Chloride: 107 mmol/L (ref 98–111)
GFR calc Af Amer: 50 mL/min — ABNORMAL LOW (ref >60)
GFR calc non Af Amer: 43 mL/min — ABNORMAL LOW (ref >60)
Glucose, Bld: 143 mg/dL — ABNORMAL HIGH (ref 70–99)
Potassium: 4.4 mmol/L (ref 3.5–5.1)
Sodium: 141 mmol/L (ref 135–145)

## 2018-03-07 LAB — PREALBUMIN: PREALBUMIN: 6 mg/dL — AB (ref 18–38)

## 2018-03-07 LAB — ECHOCARDIOGRAM COMPLETE
Height: 68 in
Weight: 2727.99 oz

## 2018-03-07 LAB — CBC
HEMATOCRIT: 36.5 % — AB (ref 39.0–52.0)
Hemoglobin: 10.9 g/dL — ABNORMAL LOW (ref 13.0–17.0)
MCH: 27.8 pg (ref 26.0–34.0)
MCHC: 29.9 g/dL — AB (ref 30.0–36.0)
MCV: 93.1 fL (ref 80.0–100.0)
Platelets: 153 10*3/uL (ref 150–400)
RBC: 3.92 MIL/uL — ABNORMAL LOW (ref 4.22–5.81)
RDW: 15.9 % — AB (ref 11.5–15.5)
WBC: 6.8 10*3/uL (ref 4.0–10.5)
nRBC: 0 % (ref 0.0–0.2)

## 2018-03-07 LAB — T3: T3, Total: 91 ng/dL (ref 71–180)

## 2018-03-07 LAB — CALCIUM, IONIZED: Calcium, Ionized, Serum: 4.8 mg/dL (ref 4.5–5.6)

## 2018-03-07 MED ORDER — METHYLPREDNISOLONE SODIUM SUCC 125 MG IJ SOLR
60.0000 mg | Freq: Two times a day (BID) | INTRAMUSCULAR | Status: DC
  Administered 2018-03-07 (×2): 60 mg via INTRAVENOUS
  Filled 2018-03-07 (×3): qty 2

## 2018-03-07 MED ORDER — POLYVINYL ALCOHOL 1.4 % OP SOLN
1.0000 [drp] | OPHTHALMIC | Status: DC | PRN
  Filled 2018-03-07: qty 15

## 2018-03-07 NOTE — Progress Notes (Signed)
ERROL, ALA ([id number]) Visit Report for 03/05/2018 Chief Complaint Document Details Patient Name: Glenn Reeves, Glenn Reeves. Date of Service: 03/05/2018 11:15 AM Medical Record Number: [id number] Patient Account Number: [id number] Date of Birth/Sex: 1931/01/04 (84 y.o. M) Treating RN: Montey Hora Primary Care Provider: Ramonita Lab Other Clinician: Referring Provider: Ramonita Lab Treating Provider/Extender: Melburn Hake, HOYT Weeks in Treatment: 7 Information Obtained from: Patient Chief Complaint Left temple malignant ulcer Electronic Signature(s) Signed: 03/05/2018 5:50:45 PM By: Worthy Keeler PA-C Entered By: Worthy Keeler on 03/05/2018 13:00:14 Glenn Reeves. ([id number]) -------------------------------------------------------------------------------- Debridement Details Patient Name: Glenn Reeves. Date of Service: 03/05/2018 11:15 AM Medical Record Number: [id number] Patient Account Number: [id number] Date of Birth/Sex: 10-Aug-1930 (84 y.o. M) Treating RN: Montey Hora Primary Care Provider: Ramonita Lab Other Clinician: Referring Provider: Ramonita Lab Treating Provider/Extender: Melburn Hake, HOYT Weeks in Treatment: 7 Debridement Performed for Wound #2 Midline,Posterior Neck Assessment: Performed By: Physician STONE III, HOYT E., PA-C Debridement Type: Debridement Level of Consciousness (Pre- Awake and Alert procedure): Pre-procedure Verification/Time Yes - 11:52 Out Taken: Start Time: 11:52 Pain Control: Lidocaine 4% Topical Solution Total Area Debrided (L x W): 0.6 (cm) x 0.7 (cm) = 0.42 (cm) Tissue and other material Non-Viable, Eschar, Skin: Dermis , Skin: Epidermis debrided: Level: Skin/Epidermis Debridement Description: Selective/Open Wound Instrument: Curette Bleeding: Minimum Hemostasis Achieved: Pressure End Time: 11:53 Procedural Pain: 0 Post Procedural Pain: 0 Response to Treatment: Procedure was tolerated well Level of  Consciousness Awake and Alert (Post-procedure): Post Debridement Measurements of Total Wound Length: (cm) 0.3 Stage: Unstageable/Unclassified Width: (cm) 0.3 Depth: (cm) 0.1 Volume: (cm) 0.007 Character of Wound/Ulcer Post Improved Debridement: Post Procedure Diagnosis Same as Pre-procedure Electronic Signature(s) Signed: 03/05/2018 5:03:03 PM By: Montey Hora Signed: 03/05/2018 5:50:45 PM By: Worthy Keeler PA-C Entered By: Montey Hora on 03/05/2018 11:54:13 [name], [name]. ([id number]) -------------------------------------------------------------------------------- HPI Details Patient Name: Glenn Reeves. Date of Service: 03/05/2018 11:15 AM Medical Record Number: [id number] Patient Account Number: [id number] Date of Birth/Sex: [birth year] (84 y.o. 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 The patient actually went to have Reeves Mohs surgery what sounds to be somewhere between 2-3 years ago. Nonetheless during the most surgery the surgeon/pathologist stated that he could not really clear the margins and felt that Reeves plastic surgery referral may be necessary. Subsequently the patient was actually eventually referred to [facility] for plastic surgery to remove the carcinoma and at that point there was no bone involvement according to the patient's daughter. Nonetheless due to several extenuating circumstances initially the surgery was postponed  about Reeves surgeon due to some conflict secondarily to that it was again postponed due to the patient being ill during the time the surgery was put supposed to be next performed. Nonetheless in the end the patient opted to forgo any surgery whatsoever following and did not really have reevaluation until 12/26/17 when he did have bone involvement. The CT scan reads that the patient has Reeves large left frontal scalp mass measuring up to 8.8 x 3.5 cm with  extensive erosion of the left frontal calvarium, left frontal sinus, left maxillary process, in the superior and lateral walls of the left orbit. This appears to be Reeves invasive skin carcinoma. Unfortunately at this time the decision has been made to proceed with radiation therapy as Reeves palliative process not curative currently. Basically the patient comes in today by way of his daughter who is his primary caretaker at this point in order for [place] to recommend options as far as dressings are concerned to help manage this in Reeves palliative manner patient's hemoglobin A1c when last taken was 6.6 and this was on 12/21/17. Currently the patient does have pain at the site of the cancer and there is Reeves definite open wound at the site as well which does drain although his daughter states it does not seem to drain to significantly. There is no evidence of infection at this time. 02/12/18 on evaluation today patient appears to be doing overall about the same in regard to the left temple ulcer. This again is Reeves cancerous/malignant wound. Nonetheless it is Reeves little bit larger he has been undergoing radiation therapy which is likely part of the reason for this. Nonetheless it does not look bad. Reeves silver contact layer has been utilized at this point by home health which I think is definitely appropriate it does look like it's doing Reeves good job. As far as the overall appearance of the wound otherwise I'm seeing no evidence of hyper granular tissue apparently Hydrofera Blue Dressing  mentioned as Reeves possibility I'm not even sure that would be necessary. I do think we need to try to avoid tape to the area as much is possible due to the fact that it seems to be causing some skin tearing. He does have Reeves new wound on the posterior portion of his neck which is actually due to Reeves pressure area where I believe his BiPAP machine is actually pushing on back of his neck too tightly. This is painful today. 03/05/18 on evaluation today patient's wound on the left temporal region actually show signs of looking Reeves little better today compared to my previous evaluation. He actually is just completing Reeves two week break in regard to his chemotherapy. He restarts chemotherapy tomorrow. Overall I feel like things are fairly stable and in fact look Reeves little bit better. Electronic Signature(s) Signed: 03/05/2018 5:50:45 PM By: Worthy Keeler PA-C Entered By: Worthy Keeler on 03/05/2018 13:00:59 Dorthey Sawyer ([id number]) -------------------------------------------------------------------------------- Physical Exam Details Patient Name: Glenn Reeves. Date of Service: 03/05/2018 11:15 AM Medical Record Number: [id number] Patient Account Number: [id number] Date of Birth/Sex: 1930/09/28 (84 y.o. M) Treating RN: Montey Hora Primary Care Provider: Ramonita Lab Other Clinician: Referring Provider: Ramonita Lab Treating Provider/Extender: Melburn Hake, HOYT Weeks in Treatment: 7 Constitutional Well-nourished and well-hydrated in no acute distress. Respiratory normal breathing without difficulty. Psychiatric this patient is able to make decisions and demonstrates good insight into disease process. Alert and Oriented x 3. pleasant and cooperative. Notes Patient's wound currently on the posterior portion of his neck actually is shown signs of great improvement and in fact I did have to remove just Reeves little bit of necrotic tissue from the surface of this which came off very easily there's  barely anything open underline. Overall I feel like that has done well there patting his CPAP machine which I think is of benefit as well. Other than this the wound on the left temple region seems to be better compared to last  time I saw this. Electronic Signature(s) Signed: 03/05/2018 5:50:45 PM By: Worthy Keeler PA-C Entered By: Worthy Keeler on 03/05/2018 13:01:42 Dorthey Sawyer ([id number]) -------------------------------------------------------------------------------- Physician Orders Details Patient Name: Glenn Reeves. Date of Service: 03/05/2018 11:15 AM Medical Record Number: [id number] Patient Account Number: [id number] Date of Birth/Sex: [birth year] (84 y.o. M) Treating RN: Montey Hora Primary Care Provider: Ramonita Lab Other Clinician: Referring Provider: Ramonita Lab Treating Provider/Extender: Melburn Hake, HOYT Weeks in Treatment: 7 Verbal / Phone Orders: No Diagnosis Coding Wound Cleansing Wound #1 Left Head - Temporal o Cleanse wound with mild soap and water o May Shower, gently pat wound dry prior to applying new dressing. Wound #2 Midline,Posterior Neck o Cleanse wound with mild soap and water o May Shower, gently pat wound dry prior to applying new dressing. Anesthetic (add to Medication List) Wound #1 Left Head - Temporal o Topical Lidocaine 4% cream applied to wound bed prior to debridement (In Clinic Only). Wound #2 Midline,Posterior Neck o Topical Lidocaine 4% cream applied to wound bed prior to debridement (In Clinic Only). Primary Wound Dressing Wound #1 Left Head - Temporal o Other: - Restore Silver Contact Layer Secondary Dressing Wound #1 Left Head - Temporal o Non-adherent pad - secure with stretch net Wound #2 Midline,Posterior Neck o Other - coverlet or bandaid for protection Dressing Change Frequency Wound #1 Left Head - Temporal o Change Dressing Monday, Wednesday, Friday Wound #2 Midline,Posterior Neck o  Change Dressing Monday, Wednesday, Friday Follow-up Appointments Wound #1 Left Head - Temporal o Return Appointment in 1 month Wound #2 Midline,Posterior Neck o Return Appointment in 1 month [place] ([id number]) Wound #1 Left Head - Temporal o [place] Visits - [place] Nurse may visit PRN to address patientos wound care needs. o FACE TO FACE ENCOUNTER: MEDICARE and MEDICAID PATIENTS: I certify that this patient is under my care and that I had Reeves face-to-face encounter that meets the physician face-to-face encounter requirements with this patient on this date. The encounter with the patient was in whole or in part for the following MEDICAL CONDITION: (primary reason for [place]) MEDICAL NECESSITY: I certify, that based on my findings, NURSING services are Reeves medically necessary home health service. HOME BOUND STATUS: I certify that my clinical findings support that this patient is homebound (i.e., Due to illness or injury, pt requires aid of supportive devices such as crutches, cane, wheelchairs, walkers, the use of special transportation or the assistance of another person to leave their place of residence. There is Reeves normal inability to leave the home and doing so requires considerable and taxing effort. Other absences are for medical reasons / religious services and are infrequent or of short duration when for other reasons). o If current dressing causes regression in wound condition, may D/C ordered dressing product/s and apply Normal Saline Moist Dressing daily until next [place] / Other MD appointment. [place] of regression in wound condition at [phone]. o Please direct any NON-WOUND related issues/requests for orders to patient's Primary Care Physician Wound #2 [place] Visits - [place] Nurse may visit PRN to address  patientos wound care needs. o FACE TO FACE ENCOUNTER: MEDICARE and MEDICAID PATIENTS: I certify that this patient is under my care and that I had Reeves face-to-face encounter that meets the physician face-to-face encounter requirements with this patient on this date. The encounter with the patient was in  whole or in part for the following MEDICAL CONDITION: (primary reason for Home Healthcare) MEDICAL NECESSITY: I certify, that based on my findings, NURSING services are Reeves medically necessary home health service. HOME BOUND STATUS: I certify that my clinical findings support that this patient is homebound (i.e., Due to illness or injury, pt requires aid of supportive devices such as crutches, cane, wheelchairs, walkers, the use of special transportation or the assistance of another person to leave their place of residence. There is Reeves normal inability to leave the home and doing so requires considerable and taxing effort. Other absences are for medical reasons / religious services and are infrequent or of short duration when for other reasons). o If current dressing causes regression in wound condition, may D/C ordered dressing product/s and apply Normal Saline Moist Dressing daily until next [place] / Other MD appointment. [place] of regression in wound condition at [phone]. o Please direct any NON-WOUND related issues/requests for orders to patient's Primary Care Physician Electronic Signature(s) Signed: 03/05/2018 5:03:03 PM By: Montey Hora Signed: 03/05/2018 5:50:45 PM By: Worthy Keeler PA-C Entered By: Montey Hora on 03/05/2018 11:58:37 Woodle, Verna Reeves. ([id number]) -------------------------------------------------------------------------------- Problem List Details Patient Name: Glenn Reeves. Date of Service: 03/05/2018 11:15 AM Medical Record Number: [id number] Patient Account Number: [id number] Date of Birth/Sex: 1930-07-24 (84  y.o. 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 ([id number]) -------------------------------------------------------------------------------- Progress Note Details Patient Name: Glenn Reeves. Date of Service: 03/05/2018 11:15 AM Medical Record Number: [id number] Patient Account Number: [id number] Date of Birth/Sex: Aug 07, 1930 (84 y.o. 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 The patient actually went to have Reeves Mohs surgery what sounds to  be somewhere between 2-3 years ago. Nonetheless during the most surgery the surgeon/pathologist stated that he could not really clear the margins and felt that Reeves plastic surgery referral may be necessary. Subsequently the patient was actually eventually referred to [facility] for plastic surgery to remove the carcinoma and at that point there was no bone involvement according to the patient's daughter. Nonetheless due to several extenuating circumstances initially the surgery was postponed about Reeves surgeon due to some conflict secondarily to that it was again postponed due to the patient being ill during the time the surgery  was put supposed to be next performed. Nonetheless in the end the patient opted to forgo any surgery whatsoever following and did not really have reevaluation until 12/26/17 when he did have bone involvement. The CT scan reads that the patient has Reeves large left frontal scalp mass measuring up to 8.8 x 3.5 cm with extensive erosion of the left frontal calvarium, left frontal sinus, left maxillary process, in the superior and lateral walls of the left orbit. This appears to be Reeves invasive skin carcinoma. Unfortunately at this time the decision has been made to proceed with radiation therapy as Reeves palliative process not curative currently. Basically the patient comes in today by way of his daughter who is his primary caretaker at this point in order for [place] to recommend options as far as dressings are concerned to help manage this in Reeves palliative manner patient's hemoglobin A1c when last taken was 6.6 and this was on 12/21/17. Currently the patient does have pain at the site of the cancer and there is Reeves definite open wound at the site as well which does drain although his daughter states it does not seem to drain to significantly. There is no evidence of infection at this time. 02/12/18 on evaluation today patient appears to be doing overall about the same in regard to the left temple ulcer. This  again is Reeves cancerous/malignant wound. Nonetheless it is Reeves little bit larger he has been undergoing radiation therapy which is likely part of the reason for this. Nonetheless it does not look bad. Reeves silver contact layer has been utilized at this point by home health which I think is definitely appropriate it does look like it's doing Reeves good job. As far as the overall appearance of the wound otherwise I'm seeing no evidence of hyper granular tissue apparently Hydrofera Blue Dressing mentioned as Reeves possibility I'm not even sure that would be necessary. I do think we need to try to avoid tape to the area as much is possible due to the fact that it seems to be causing some skin tearing. He does have Reeves new wound on the posterior portion of his neck which is actually due to Reeves pressure area where I believe his BiPAP machine is actually pushing on back of his neck too tightly. This is painful today. 03/05/18 on evaluation today patient's wound on the left temporal region actually show signs of looking Reeves little better today compared to my previous evaluation. He actually is just completing Reeves two week break in regard to his chemotherapy. He restarts chemotherapy tomorrow. Overall I feel like things are fairly stable and in fact look Reeves little bit better. Patient History Information obtained from Patient. Family History BRENON, ANTOSH Reeves. ([id number]) Heart Disease - Father,Siblings, Stroke - Siblings, No family history of Cancer, Diabetes, Hypertension, Kidney Disease, Lung Disease, Seizures, Thyroid Problems, Tuberculosis. Social History Former smoker - cigar, Marital Status - Widowed, Alcohol Use - Never, Drug Use - No History, Caffeine Use - Rarely. Medical And Surgical History Notes Ear/Nose/Mouth/Throat left ear squamous cell removed Cardiovascular Dr Ramonita Lab has pulmonary fibrosis Review of Systems (ROS) Constitutional Symptoms (General Health) Denies complaints or symptoms of Fever,  Chills. Respiratory The patient has no complaints or symptoms. Cardiovascular The patient has no complaints or symptoms. Psychiatric The patient has no complaints or symptoms. Objective Constitutional Well-nourished and well-hydrated in no acute distress. Vitals Time Taken: 11:15 AM, Height: 69 in, Weight: 166 lbs, BMI: 24.5, Temperature: 97.6 F, Pulse: 71 bpm, Respiratory  Rate: 22 breaths/min, Blood Pressure: 91/49 mmHg. Respiratory normal breathing without difficulty. Psychiatric this patient is able to make decisions and demonstrates good insight into disease process. Alert and Oriented x 3. pleasant and cooperative. General Notes: Patient's wound currently on the posterior portion of his neck actually is shown signs of great improvement and in fact I did have to remove just Reeves little bit of necrotic tissue from the surface of this which came off very easily there's barely anything open underline. Overall I feel like that has done well there patting his CPAP machine which I think is of benefit as well. Other than this the wound on the left temple region seems to be better compared to last time I saw this. Integumentary (Hair, Skin) Wound #1 status is Open. Original cause of wound was Other Lesion. The wound is located on the Left Head - Temporal. The wound measures 2.5cm length x 4cm width x 0.1cm depth; 7.854cm^2 area and 0.785cm^3 volume. Wound #2 status is Open. Original cause of wound was Pressure Injury. The wound is located on the Force Neck. TREVER, STREATER Reeves. ([id number]) The wound measures 0.6cm length x 0.7cm width x 0.2cm depth; 0.33cm^2 area and 0.066cm^3 volume. Assessment Active Problems ICD-10 Squamous cell carcinoma of skin, unspecified Non-pressure chronic ulcer of skin of other sites with fat layer exposed Type 2 diabetes mellitus with other skin ulcer Pressure ulcer of other site, unstageable Essential (primary) hypertension Procedures Wound  #2 Pre-procedure diagnosis of Wound #2 is Reeves Pressure Ulcer located on the Midline,Posterior Neck . There was Reeves Selective/Open Wound Skin/Epidermis Debridement with Reeves total area of 0.42 sq cm performed by STONE III, HOYT E., PA-C. With the following instrument(s): Curette to remove Non-Viable tissue/material. Material removed includes Eschar, Skin: Dermis, and Skin: Epidermis after achieving pain control using Lidocaine 4% Topical Solution. No specimens were taken. Reeves time out was conducted at 11:52, prior to the start of the procedure. Reeves Minimum amount of bleeding was controlled with Pressure. The procedure was tolerated well with Reeves pain level of 0 throughout and Reeves pain level of 0 following the procedure. Post Debridement Measurements: 0.3cm length x 0.3cm width x 0.1cm depth; 0.007cm^3 volume. Post debridement Stage noted as Unstageable/Unclassified. Character of Wound/Ulcer Post Debridement is improved. Post procedure Diagnosis Wound #2: Same as Pre-Procedure Plan Wound Cleansing: Wound #1 Left Head - Temporal: Cleanse wound with mild soap and water May Shower, gently pat wound dry prior to applying new dressing. Wound #2 Midline,Posterior Neck: Cleanse wound with mild soap and water May Shower, gently pat wound dry prior to applying new dressing. Anesthetic (add to Medication List): Wound #1 Left Head - Temporal: Topical Lidocaine 4% cream applied to wound bed prior to debridement (In Clinic Only). Wound #2 Midline,Posterior Neck: Topical Lidocaine 4% cream applied to wound bed prior to debridement (In Clinic Only). Primary Wound Dressing: Wound #1 Left Head - Temporal: Other: - Restore Silver Contact Layer Secondary Dressing: JARELL, MCEWEN Reeves. ([id number]) Wound #1 Left Head - Temporal: Non-adherent pad - secure with stretch net Wound #2 Midline,Posterior Neck: Other - coverlet or bandaid for protection Dressing Change Frequency: Wound #1 Left Head - Temporal: Change Dressing  Monday, Wednesday, Friday Wound #2 Midline,Posterior Neck: Change Dressing Monday, Wednesday, Friday Follow-up Appointments: Wound #1 Left Head - Temporal: Return Appointment in 1 month Wound #2 Midline,Posterior Neck: Return Appointment in 1 month Home Health: Wound #1 Left Head - Temporal: Continue Home Health Visits - [place] Nurse may visit PRN to  address patient s wound care needs. FACE TO FACE ENCOUNTER: MEDICARE and MEDICAID PATIENTS: I certify that this patient is under my care and that I had Reeves face-to-face encounter that meets the physician face-to-face encounter requirements with this patient on this date. The encounter with the patient was in whole or in part for the following MEDICAL CONDITION: (primary reason for [place]) MEDICAL NECESSITY: I certify, that based on my findings, NURSING services are Re[facility] medically necessary home health service. HOME BOUND STATUS: I certify that my clinical findings support that this patient is homebound (i.e., Due to illness or injury, pt requires aid of supportive devices such as crutches, cane, wheelchairs, walkers, the use of special transportation or the assistance of another person to leave their place of residence. There is Reeves normal inability to leave the home and doing so requires considerable and taxing effort. Other absences are for medical reasons / religious services and are infrequent or of short duration when for other reasons). If current dressing causes regression in wound condition, may D/C ordered dressing product/s and apply Normal Saline Moist Dressing daily until next [place] / Other MD appointment. [place] of regression in wound condition at [phone]. Please direct any NON-WOUND related issues/requests for orders to patient's Primary Care Physician Wound #2 Midline,Posterior Neck: [place] Visits - [place] Nurse may visit PRN to address patient  s wound care needs. FACE TO FACE ENCOUNTER: MEDICARE and MEDICAID PATIENTS: I certify that this patient is under my care and that I had Reeves face-to-face encounter that meets the physician face-to-face encounter requirements with this patient on this date. The encounter with the patient was in whole or in part for the following MEDICAL CONDITION: (primary reason for [place]) MEDICAL NECESSITY: I certify, that based on my findings, NURSING services are Reeves medically necessary home health service. HOME BOUND STATUS: I certify that my clinical findings support that this patient is homebound (i.e., Due to illness or injury, pt requires aid of supportive devices such as crutches, cane, wheelchairs, walkers, the use of special transportation or the assistance of another person to leave their place of residence. There is Reeves normal inability to leave the home and doing so requires considerable and taxing effort. Other absences are for medical reasons / religious services and are infrequent or of short duration when for other reasons). If current dressing causes regression in wound condition, may D/C ordered dressing product/s and apply Normal Saline Moist Dressing daily until next [place] / Other MD appointment. [place] of regression in wound condition at [phone]. Please direct any NON-WOUND related issues/requests for orders to patient's Primary Care Physician At this point my suggestion is going to be that we continue with the above wound care measures for the next week. We will see were things stand at follow-up. Otherwise I am gonna recommend continuing home health per above as well. Please see above for specific wound care orders. We will see patient for re-evaluation in 4 week(s) here in the clinic. If anything worsens or changes patient will contact our office for additional recommendations. Electronic Signature(s) JOLLY, BLEICHER ([id number]) Signed:  03/05/2018 5:50:45 PM By: Worthy Keeler PA-C Entered By: Worthy Keeler on 03/05/2018 13:02:10 Dorthey Sawyer ([id number]) -------------------------------------------------------------------------------- ROS/PFSH Details Patient Name: Glenn Reeves. Date of Service: 03/05/2018 11:15 AM Medical Record Number: [id number] Patient Account Number: [id number] Date of Birth/Sex: July 24, 1930 (84 y.o. M) Treating RN: Montey Hora Primary  Care Provider: Ramonita Lab Other Clinician: Referring Provider: Ramonita Lab Treating Provider/Extender: Melburn Hake, HOYT Weeks in Treatment: 7 Information Obtained From Patient Wound History Do you currently have one or more open woundso Yes How many open wounds do you currently haveo 1 Approximately how long have you had your woundso 02/23/2017 How have you been treating your wound(s) until nowo peroxide/ saline bandage Has your wound(s) ever healed and then re-openedo Yes Have you had any lab work done in the past montho No Have you tested positive for an antibiotic resistant organism (MRSA, VRE)o No Have you tested positive for osteomyelitis (bone infection)o No Have you had any tests for circulation on your legso No Constitutional Symptoms (General Health) Complaints and Symptoms: Negative for: Fever; Chills Eyes Medical History: Positive for: Cataracts - removed Negative for: Glaucoma; Optic Neuritis Ear/Nose/Mouth/Throat Medical History: Negative for: Chronic sinus problems/congestion; Middle ear problems Past Medical History Notes: left ear squamous cell removed Hematologic/Lymphatic Medical History: Negative for: Anemia; Hemophilia; Human Immunodeficiency Virus; Lymphedema; Sickle Cell Disease Respiratory Complaints and Symptoms: No Complaints or Symptoms Medical History: Positive for: Chronic Obstructive Pulmonary Disease (COPD); Sleep Apnea Negative for: Asthma; Pneumothorax; Tuberculosis Cardiovascular Complaints and  Symptoms: No Complaints or Symptoms Arrellano, Kimmie Reeves. ([id number]) Medical History: Positive for: Arrhythmia - afib; Congestive Heart Failure; Hypertension Negative for: Coronary Artery Disease; Deep Vein Thrombosis; Hypotension; Myocardial Infarction; Peripheral Arterial Disease; Peripheral Venous Disease; Phlebitis; Vasculitis Past Medical History Notes: Dr Ramonita Lab has pulmonary fibrosis Gastrointestinal Medical History: Negative for: Cirrhosis ; Colitis; Crohnos; Hepatitis [place]; Hepatitis B; Hepatitis C Endocrine Medical History: Positive for: Type II Diabetes Negative for: Type I Diabetes Time with diabetes: 20 years Treated with: Insulin Blood sugar tested every day: Yes Tested : tid Blood sugar testing results: Breakfast: 90 Genitourinary Medical History: Negative for: End Stage Renal Disease Immunological Medical History: Negative for: Lupus Erythematosus; Raynaudos; Scleroderma Integumentary (Skin) Medical History: Negative for: History of Burn; History of pressure wounds Musculoskeletal Medical History: Negative for: Gout; Rheumatoid Arthritis; Osteoarthritis; Osteomyelitis Neurologic Medical History: Negative for: Dementia; Neuropathy; Quadriplegia; Paraplegia; Seizure Disorder Oncologic Medical History: Negative for: Received Chemotherapy; Received Radiation Psychiatric Complaints and Symptoms: No Complaints or Symptoms Battie, Neco Reeves. ([id number]) HBO Extended History Items Eyes: Cataracts Immunizations Pneumococcal Vaccine: Received Pneumococcal Vaccination: No Implantable Devices Family and Social History Cancer: No; Diabetes: No; Heart Disease: Yes - Father,Siblings; Hypertension: No; Kidney Disease: No; Lung Disease: No; Seizures: No; Stroke: Yes - Siblings; Thyroid Problems: No; Tuberculosis: No; Former smoker - cigar; Marital Status - Widowed; Alcohol Use: Never; Drug Use: No History; Caffeine Use: Rarely; Financial Concerns: No; Food,  Clothing or Shelter Needs: No; Support System Lacking: No; Transportation Concerns: No; Advanced Directives: Yes (Not Provided); Patient does not want information on Advanced Directives; Do not resuscitate: No; Living Will: Yes (Not Provided); Medical Power of Attorney: No Physician Affirmation I have reviewed and agree with the above information. Electronic Signature(s) Signed: 03/05/2018 5:03:03 PM By: Montey Hora Signed: 03/05/2018 5:50:45 PM By: Worthy Keeler PA-C Entered By: Worthy Keeler on 03/05/2018 13:01:21 Glenn Reeves. ([id number]) -------------------------------------------------------------------------------- SuperBill Details Patient Name: Glenn Reeves. Date of Service: 03/05/2018 Medical Record Number: [id number] Patient Account Number: [id number] Date of Birth/Sex: [birth year] (84 y.o. M) Treating RN: Montey Hora Primary Care Provider: Ramonita Lab Other Clinician: Referring Provider: Ramonita Lab Treating Provider/Extender: Melburn Hake, HOYT Weeks in Treatment: 7 Diagnosis Coding ICD-10 Codes Code Description C44.92 Squamous cell carcinoma of skin, unspecified L98.492 Non-pressure chronic ulcer of skin of other sites  with fat layer exposed E11.622 Type 2 diabetes mellitus with other skin ulcer L89.890 Pressure ulcer of other site, unstageable I10 Essential (primary) hypertension Facility Procedures CPT4 Code: 12258346 Description: [phone] - [place] WOUND 1ST 20 SQ CM OR < ICD-10 Diagnosis Description L89.890 Pressure ulcer of other site, unstageable Modifier: Quantity: 1 Physician Procedures CPT4 Code: 1252712 Description: 92909 - WC PHYS DEBR WO ANESTH 20 SQ CM ICD-10 Diagnosis Description L89.890 Pressure ulcer of other site, unstageable Modifier: Quantity: 1 Electronic Signature(s) Signed: 03/05/2018 5:50:45 PM By: Worthy Keeler PA-C Entered By: Worthy Keeler on 03/05/2018 13:02:31

## 2018-03-07 NOTE — Progress Notes (Signed)
[facility] [place] [place] NAME: Glenn Reeves    MR#:  [id number]  DATE OF BIRTH:  Aug 04, 1930  SUBJECTIVE:   Doing better this morning.  Shortness of breath slowly improving.  Still having significant dyspnea when he tries to move around.  Remains on 2 L O2 by nasal cannula.  REVIEW OF SYSTEMS:  Review of Systems  Constitutional: Negative for chills and fever.  HENT: Negative for congestion and sore throat.   Eyes: Negative for blurred vision and double vision.  Respiratory: Positive for shortness of breath. Negative for cough.   Cardiovascular: Negative for chest pain, palpitations and leg swelling.  Gastrointestinal: Negative for abdominal pain, nausea and vomiting.  Genitourinary: Negative for dysuria, frequency and urgency.  Musculoskeletal: Positive for falls. Negative for back pain and neck pain.  Neurological: Negative for dizziness.  Psychiatric/Behavioral: Negative for depression. The patient is not nervous/anxious.     DRUG ALLERGIES:   Allergies  Allergen Reactions  . Codeine Rash   VITALS:  Blood pressure 104/61, pulse 81, temperature 97.7 F (36.5 C), resp. rate 20, height 5\' 8"  (1.727 m), weight 77.3 kg, SpO2 100 %. PHYSICAL EXAMINATION:  Physical Exam  General: Laying in bed, no acute distress, pleasant and conversational HEENT: Left temporal scalp wound with dry dressing in place, left eye with chronic skin changes, oropharynx is clear and moist. No oropharyngeal exudate.  L temporal scalp bandage (chronic wound).   Neck: Neck supple. No JVD present. No thyromegaly present.  Cardiovascular: RRR, normal S1/S2, no murmurs, rubs, gallops Pulmonary: + Mild diffuse inspiratory crackles present, no wheezing, nasal cannula in place, normal work of breathing at rest. Abdominal: +BS, soft, nontender, nondistended Musculoskeletal: Normal range of motion. He exhibits no edema or tenderness.  Neurological: He is alert.    Cranial nerves II through XII intact, no focal deficits,+ Global weakness Skin: scalp wound above Psychiatric: He has a normal mood and affect. His speech is normal and behavior is normal. Judgment and thought content normal. Oriented to person and place. LABORATORY PANEL:  Male CBC Recent Labs  Lab 03/07/18 0444  WBC 6.8  HGB 10.9*  HCT 36.5*  PLT 153   ------------------------------------------------------------------------------------------------------------------ Chemistries  Recent Labs  Lab 03/05/18 2154 03/06/18 0630 03/07/18 0444  NA 142  --  141  K 4.8  --  4.4  CL 106  --  107  CO2 27  --  29  GLUCOSE 190*  --  143*  BUN 27*  --  28*  CREATININE 1.56*  --  1.42*  CALCIUM 8.2*  --  8.2*  MG  --  2.1  --   AST 31  --   --   ALT 15  --   --   ALKPHOS 64  --   --   BILITOT 0.8  --   --    RADIOLOGY:  No results found. ASSESSMENT AND PLAN:   Acute on chronic hypoxic respiratory failure-likely secondary to pulmonary fibrosis flare.  Remains on 2 L O2 this morning. -Continue IV Solu-Medrol for another 24 hours with plans to switch to prednisone tomorrow for a total 5-day course -Duonebs as needed -Incentive spirometry and pulmonary toileting -ECHO pending -Wean O2 as able -Plan to ambulate with pulse ox prior to discharge -Anticipate that patient will require O2 as needed at home -PT consult  Fall- occurred prior to admission.  Family thinks it was related to him being hypoxic.  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 Currently undergoing radiation with palliative intent. Follows with wound care as an outpatient.  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 Management plans discussed with the patient, family and they are in agreement.  CODE STATUS: Full Code  TOTAL TIME TAKING CARE OF THIS PATIENT: 40 minutes.   More than 50% of the time was spent in counseling/coordination of care: YES  POSSIBLE D/C tomorrow, DEPENDING ON CLINICAL CONDITION.   Berna Spare Verdean Murin M.D on 03/07/2018 at 11:49 AM  Between 7am to 6pm - Pager - [phone]  After 6pm go to [email or web address] - password [email or web address] [facility]  [facility] [place] [facility]  Office  [phone]  CC: Primary care physician; Adin Hector, MD  Note: This dictation was prepared with Dragon dictation along with smaller phrase technology. Any transcriptional errors that result from this process are unintentional.

## 2018-03-07 NOTE — Progress Notes (Signed)
[place], [place] ([id number]) Visit Report for 03/05/2018 Arrival Information Details Patient Name: Glenn Reeves, Glenn A. Date of Service: 03/05/2018 11:15 AM Medical Record Number: [id number] Patient Account Number: [id number] Date of Birth/Sex: 02-Sep-1930 (84 y.o. M) Treating RN: Montey Hora Primary Care January Bergthold: Ramonita Lab Other Clinician: Referring Reiner Loewen: Ramonita Lab Treating Haleemah Buckalew/Extender: Melburn Hake, HOYT Weeks in Treatment: 7 Visit Information History Since Last Visit Added or deleted any medications: No Patient Arrived: Wheel Chair Any new allergies or adverse reactions: No Arrival Time: 11:13 Had a fall or experienced change in No Accompanied By: daughter activities of daily living that may affect Transfer Assistance: None risk of falls: Signs or symptoms of abuse/neglect since last visito No Hospitalized since last visit: No Implantable device outside of the clinic excluding No cellular tissue based products placed in the center since last visit: Has Dressing in Place as Prescribed: Yes Pain Present Now: No Notes LEft patient in wheelchair for visit. Electronic Signature(s) Signed: 03/05/2018 5:03:03 PM By: Montey Hora Entered By: Montey Hora on 03/05/2018 11:15:13 Glenn Reeves ([id number]) -------------------------------------------------------------------------------- Encounter Discharge Information Details Patient Name: Glenn Bras A. Date of Service: 03/05/2018 11:15 AM Medical Record Number: [id number] Patient Account Number: [id number] Date of Birth/Sex: 12-18-1930 (84 y.o. M) Treating RN: Montey Hora Primary Care Anyi Fels: Ramonita Lab Other Clinician: Referring Sherilyn Windhorst: Ramonita Lab Treating Deniece Rankin/Extender: Melburn Hake, HOYT Weeks in Treatment: 7 Encounter Discharge Information Items Post Procedure Vitals Discharge Condition: Stable Temperature (F): 97.9 Ambulatory Status: Wheelchair Pulse (bpm): 71 Discharge  Destination: Home Respiratory Rate (breaths/min): 18 Transportation: Private Auto Blood Pressure (mmHg): 91/49 Accompanied By: daughter Schedule Follow-up Appointment: Yes Clinical Summary of Care: Electronic Signature(s) Signed: 03/05/2018 5:03:03 PM By: Montey Hora Entered By: Montey Hora on 03/05/2018 12:05:33 Glenn Reeves ([id number]) -------------------------------------------------------------------------------- Lower Extremity Assessment Details Patient Name: Glenn Bras A. Date of Service: 03/05/2018 11:15 AM Medical Record Number: [id number] Patient Account Number: [id number] Date of Birth/Sex: 08-Sep-1930 (84 y.o. M) Treating RN: Cornell Barman Primary Care Malynn Lucy: Ramonita Lab Other Clinician: Referring Yitzchok Carriger: Ramonita Lab Treating Pari Lombard/Extender: Worthy Keeler Weeks in Treatment: 7 Electronic Signature(s) Signed: 03/05/2018 4:48:54 PM By: Gretta Cool, BSN, RN, CWS, Kim RN, BSN Entered By: Gretta Cool, BSN, RN, CWS, Kim on 03/05/2018 11:29:04 Glenn Reeves ([id number]) -------------------------------------------------------------------------------- Multi Wound Chart Details Patient Name: Glenn Bras A. Date of Service: 03/05/2018 11:15 AM Medical Record Number: [id number] Patient Account Number: [id number] Date of Birth/Sex: 1930/10/10 (84 y.o. M) Treating RN: Cornell Barman Primary Care Xzaiver Vayda: Ramonita Lab Other Clinician: Referring Brienne Liguori: Ramonita Lab Treating Shada Nienaber/Extender: Melburn Hake, HOYT Weeks in Treatment: 7 Vital Signs Height(in): 69 Pulse(bpm): 71 Weight(lbs): 166 Blood Pressure(mmHg): 91/49 Body Mass Index(BMI): 25 Temperature(F): 97.6 Respiratory Rate 22 (breaths/min): Photos: [1:No Photos] [2:No Photos] [N/A:N/A] Wound Location: [1:Left Head - Temporal] [2:Midline, Posterior Neck] [N/A:N/A] Wounding Event: [1:Other Lesion] [2:Pressure Injury] [N/A:N/A] Primary Etiology: [1:Malignant Wound] [2:Pressure Ulcer] [N/A:N/A] Date  Acquired: [1:02/23/2017] [2:02/05/2018] [N/A:N/A] Weeks of Treatment: [1:7] [2:3] [N/A:N/A] Wound Status: [1:Open] [2:Open] [N/A:N/A] Measurements L x W x D [1:2.5x4x0.1] [2:0.6x0.7x0.2] [N/A:N/A] (cm) Area (cm) : [1:7.854] [2:0.33] [N/A:N/A] Volume (cm) : [1:0.785] [2:0.066] [N/A:N/A] % Reduction in Area: [1:50.60%] [2:6.50%] [N/A:N/A] % Reduction in Volume: [1:75.30%] [2:-88.60%] [N/A:N/A] Classification: [1:Full Thickness Without Exposed Support Structures] [2:Unstageable/Unclassified] [N/A:N/A] Periwound Skin Texture: [1:No Abnormalities Noted] [2:No Abnormalities Noted] [N/A:N/A] Periwound Skin Moisture: [1:No Abnormalities Noted] [2:No Abnormalities Noted] [N/A:N/A] Periwound Skin Color: [1:No Abnormalities Noted No] [2:No Abnormalities Noted No] [N/A:N/A N/A] Treatment Notes Electronic Signature(s) Signed: 03/05/2018 4:48:54 PM By: Gretta Cool, BSN, RN, CWS, Kim  RN, BSN Entered By: Gretta Cool, BSN, RN, CWS, Kim on 03/05/2018 11:29:17 Glenn Reeves ([id number]) -------------------------------------------------------------------------------- [place] Details Patient Name: Glenn Bras A. Date of Service: 03/05/2018 11:15 AM Medical Record Number: [id number] Patient Account Number: [id number] Date of Birth/Sex: Aug 04, 1930 (84 y.o. M) Treating RN: Cornell Barman Primary Care Remiel Corti: Ramonita Lab Other Clinician: Referring Girlie Veltri: Ramonita Lab Treating Sameria Morss/Extender: Melburn Hake, HOYT Weeks in Treatment: 7 Active Inactive ` Abuse / Safety / Falls / Self Care Management Nursing Diagnoses: Impaired physical mobility Goals: Patient will remain injury free related to falls Date Initiated: 01/15/2018 Target Resolution Date: 03/22/2018 Goal Status: Active Interventions: Assess fall risk on admission and as needed Notes: ` Malignancy/Atypical Etiology Nursing Diagnoses: Knowledge deficit related to disease process and management of  malignancy Goals: Patient/caregiver will verbalize understanding of disease process and disease management of atypical ulcer etiology Date Initiated: 02/12/2018 Target Resolution Date: 02/22/2018 Goal Status: Active Interventions: Assess patient and family medical history for signs and symptoms of malignancy/atypical etiology upon admission Provide education on malignant ulcerations Notes: ` Necrotic Tissue Nursing Diagnoses: Knowledge deficit related to management of necrotic/devitalized tissue Goals: Patient/caregiver will verbalize understanding of reason and process for debridement of necrotic tissue Date Initiated: 02/12/2018 Target Resolution Date: 02/22/2018 Goal Status: Active Glenn Reeves, Glenn A. ([id number]) Interventions: Assess patient pain level pre-, during and post procedure and prior to discharge Provide education on necrotic tissue and debridement process Treatment Activities: Apply topical anesthetic as ordered : 02/12/2018 Notes: ` Nutrition Nursing Diagnoses: Potential for alteratiion in Nutrition/Potential for imbalanced nutrition Goals: Patient/caregiver agrees to and verbalizes understanding of need to use nutritional supplements and/or vitamins as prescribed Date Initiated: 01/15/2018 Target Resolution Date: 03/22/2018 Goal Status: Active Interventions: Assess patient nutrition upon admission and as needed per policy Notes: ` Orientation to the Wound Care Program Nursing Diagnoses: Knowledge deficit related to the wound healing center program Goals: Patient/caregiver will verbalize understanding of the [place] Date Initiated: 01/15/2018 Target Resolution Date: 03/22/2018 Goal Status: Active Interventions: Provide education on orientation to the wound center Notes: ` Wound/Skin Impairment Nursing Diagnoses: Impaired tissue integrity Goals: Ulcer/skin breakdown will heal within 14 weeks Date Initiated: 01/15/2018 Target  Resolution Date: 03/22/2018 Goal Status: Active Interventions: Assess patient/caregiver ability to obtain necessary supplies Glenn Reeves, Glenn A. ([id number]) Assess patient/caregiver ability to perform ulcer/skin care regimen upon admission and as needed Assess ulceration(s) every visit Notes: Electronic Signature(s) Signed: 03/05/2018 4:48:54 PM By: Gretta Cool, BSN, RN, CWS, Kim RN, BSN Entered By: Gretta Cool, BSN, RN, CWS, Kim on 03/05/2018 11:29:10 Glenn Reeves ([id number]) -------------------------------------------------------------------------------- Pain Assessment Details Patient Name: Glenn Bras A. Date of Service: 03/05/2018 11:15 AM Medical Record Number: [id number] Patient Account Number: [id number] Date of Birth/Sex: 06-03-1930 (84 y.o. M) Treating RN: Montey Hora Primary Care Myranda Pavone: Ramonita Lab Other Clinician: Referring Clarnce Homan: Ramonita Lab Treating Durene Dodge/Extender: Melburn Hake, HOYT Weeks in Treatment: 7 Active Problems Location of Pain Severity and Description of Pain Patient Has Paino No Site Locations Pain Management and Medication Current Pain Management: Electronic Signature(s) Signed: 03/05/2018 5:03:03 PM By: Montey Hora Entered By: Montey Hora on 03/05/2018 11:15:19 Glenn Reeves ([id number]) -------------------------------------------------------------------------------- Patient/Caregiver Education Details Patient Name: Glenn Bras A. Date of Service: 03/05/2018 11:15 AM Medical Record Number: [id number] Patient Account Number: [id number] Date of Birth/Gender: [birth year] (84 y.o. M) Treating RN: Montey Hora Primary Care Physician: Ramonita Lab Other Clinician: Referring Physician: Ramonita Lab Treating Physician/Extender: Sharalyn Ink in Treatment: 7 Education Assessment Education Provided To: Caregiver Education Topics Provided Wound/Skin Impairment:  Handouts: Other: wound care as ordered Methods:  Demonstration, Explain/Verbal Responses: State content correctly Electronic Signature(s) Signed: 03/05/2018 5:03:03 PM By: Montey Hora Entered By: Montey Hora on 03/05/2018 12:03:25 Glenn Bras A. ([id number]) -------------------------------------------------------------------------------- Wound Assessment Details Patient Name: Glenn Bras A. Date of Service: 03/05/2018 11:15 AM Medical Record Number: [id number] Patient Account Number: [id number] Date of Birth/Sex: [birth year] (84 y.o. M) Treating RN: Cornell Barman Primary Care Casson Catena: Ramonita Lab Other Clinician: Referring Reily Treloar: Ramonita Lab Treating Vanecia Limpert/Extender: Melburn Hake, HOYT Weeks in Treatment: 7 Wound Status Wound Number: 1 Primary Etiology: Malignant Wound Wound Location: Left Head - Temporal Wound Status: Open Wounding Event: Other Lesion Date Acquired: 02/23/2017 Weeks Of Treatment: 7 Clustered Wound: No Wound Measurements Length: (cm) 2.5 Width: (cm) 4 Depth: (cm) 0.1 Area: (cm) 7.854 Volume: (cm) 0.785 % Reduction in Area: 50.6% % Reduction in Volume: 75.3% Wound Description Full Thickness Without Exposed Support Classification: Structures Periwound Skin Texture Texture Color No Abnormalities Noted: No No Abnormalities Noted: No Moisture No Abnormalities Noted: No Treatment Notes Wound #1 (Left Head - Temporal) Notes mepitel ag on head with telfa and netting, coverlet on neck Electronic Signature(s) Signed: 03/05/2018 4:48:54 PM By: Gretta Cool, BSN, RN, CWS, Kim RN, BSN Entered By: Gretta Cool, BSN, RN, CWS, Kim on 03/05/2018 11:28:12 Glenn Reeves ([id number]) -------------------------------------------------------------------------------- Wound Assessment Details Patient Name: Glenn Bras A. Date of Service: 03/05/2018 11:15 AM Medical Record Number: [id number] Patient Account Number: [id number] Date of Birth/Sex: Jan 20, 1931 (84 y.o. M) Treating RN: Cornell Barman Primary Care  Simranjit Thayer: Ramonita Lab Other Clinician: Referring Jermario Kalmar: Ramonita Lab Treating Matalyn Nawaz/Extender: Melburn Hake, HOYT Weeks in Treatment: 7 Wound Status Wound Number: 2 Primary Etiology: Pressure Ulcer Wound Location: Midline, Posterior Neck Wound Status: Open Wounding Event: Pressure Injury Date Acquired: 02/05/2018 Weeks Of Treatment: 3 Clustered Wound: No Wound Measurements Length: (cm) 0.6 Width: (cm) 0.7 Depth: (cm) 0.2 Area: (cm) 0.33 Volume: (cm) 0.066 % Reduction in Area: 6.5% % Reduction in Volume: -88.6% Wound Description Classification: Unstageable/Unclassified Periwound Skin Texture Texture Color No Abnormalities Noted: No No Abnormalities Noted: No Moisture No Abnormalities Noted: No Treatment Notes Wound #2 (Midline, Posterior Neck) Notes mepitel ag on head with telfa and netting, coverlet on neck Electronic Signature(s) Signed: 03/05/2018 4:48:54 PM By: Gretta Cool, BSN, RN, CWS, Kim RN, BSN Entered By: Gretta Cool, BSN, RN, CWS, Kim on 03/05/2018 11:28:12 Glenn Reeves ([id number]) -------------------------------------------------------------------------------- Vitals Details Patient Name: Glenn Bras A. Date of Service: 03/05/2018 11:15 AM Medical Record Number: [id number] Patient Account Number: [id number] Date of Birth/Sex: 1930-09-25 (84 y.o. M) Treating RN: Montey Hora Primary Care Robin Petrakis: Ramonita Lab Other Clinician: Referring Melvern Ramone: Ramonita Lab Treating Emil Klassen/Extender: Melburn Hake, HOYT Weeks in Treatment: 7 Vital Signs Time Taken: 11:15 Temperature (F): 97.6 Height (in): 69 Pulse (bpm): 71 Weight (lbs): 166 Respiratory Rate (breaths/min): 22 Body Mass Index (BMI): 24.5 Blood Pressure (mmHg): 91/49 Reference Range: 80 - 120 mg / dl Electronic Signature(s) Signed: 03/05/2018 5:03:03 PM By: Montey Hora Entered By: Montey Hora on 03/05/2018 11:17:03

## 2018-03-07 NOTE — Evaluation (Signed)
Physical Therapy Evaluation Patient Details Name: Glenn Reeves MRN: [id number] DOB: 1931/03/07 Today's Date: 03/07/2018   History of Present Illness  Glenn Reeves is an 84yo male who comes to [facility] on 11/13 p fall athome, now very weak- pt admitted with ARF c hypoxia. PMH: Lt temporal SCC, IDDM2, pulmonary fibrosis. Pt is scheduled to start radiation therapy 5d/week on Monday 11/18, needing to exit the house with family and 2 steps.   Clinical Impression  Pt admitted with above diagnosis. Pt currently with functional limitations due to the deficits listed below (see "PT Problem List"). Upon entry, pt in bed, DTR enters mid session. The pt is awake and agreeable to participate. significant BLE weakness limiting AMB to <33feet c RW. SpO2 dropping to 84% on room air with transfer, 89% on 2L/min AMB. Functional mobility assessment demonstrates increased effort/time requirements, poor tolerance, and need for physical assistance, whereas the patient performed these at a higher level of independence PTA.    Pt will benefit from skilled PT intervention to increase independence and safety with basic mobility in preparation for discharge to the venue listed below.       Follow Up Recommendations Home health PT    Equipment Recommendations  None recommended by PT    Recommendations for Other Services       Precautions / Restrictions Precautions Precautions: Fall Restrictions Weight Bearing Restrictions: No      Mobility  Bed Mobility Overal bed mobility: Needs Assistance Bed Mobility: Supine to Sit     Supine to sit: Supervision     General bed mobility comments: heavy effort and additional time needed.   Transfers Overall transfer level: Needs assistance   Transfers: Sit to/from Stand Sit to Stand: Supervision         General transfer comment: modA from bed, supervision from chair (arm rails pushoff)   Ambulation/Gait Ambulation/Gait assistance: Min guard Gait  Distance (Feet): 36 Feet Assistive device: Rolling walker (2 wheeled)       General Gait Details: heavy BUE use, alternting FWD and retro AMB in room. 89% terminal SpO2 on 2L; drops to 84% on room air during stand pivot transfer.   Stairs            Wheelchair Mobility    Modified Rankin (Stroke Patients Only)       Balance                                             Pertinent Vitals/Pain Pain Assessment: No/denies pain    Home Living Family/patient expects to be discharged to:: Private residence Living Arrangements: Children(lives at daughters home) Available Help at Discharge: Family Type of Home: House Home Access: Stairs to enter Entrance Stairs-Rails: Right;Left;Can reach both Entrance Stairs-Number of Steps: 3 Home Layout: One level Home Equipment: Environmental consultant - 2 wheels;Cane - single point;Wheelchair - [employer] - 4 wheels Additional Comments: SIL assists with bathing, RW intermittently in the home, RW when going outside of home.     Prior Function           Comments: AMB room to room with RW, but this is typically difficult per DTR.      Hand Dominance   Dominant Hand: Right    Extremity/Trunk Assessment   Upper Extremity Assessment Upper Extremity Assessment: Generalized weakness;Overall [facility] for tasks assessed    Lower Extremity Assessment Lower Extremity Assessment: Generalized  weakness;Overall WFL for tasks assessed       Communication   Communication: HOH  Cognition Arousal/Alertness: Awake/alert Behavior During Therapy: WFL for tasks assessed/performed Overall Cognitive Status: Within Functional Limits for tasks assessed                                        General Comments      Exercises     Assessment/Plan    PT Assessment Patient needs continued PT services  PT Problem List Decreased strength;Decreased activity tolerance;Decreased mobility;Decreased balance;Cardiopulmonary status  limiting activity       PT Treatment Interventions Functional mobility training;Therapeutic activities;Therapeutic exercise;Patient/family education;Stair training    PT Goals (Current goals can be found in the Care Plan section)  Acute Rehab PT Goals Patient Stated Goal: be able to tolerate stairs for home.  PT Goal Formulation: With family Time For Goal Achievement: 03/14/18 Potential to Achieve Goals: Fair    Frequency Min 2X/week   Barriers to discharge Inaccessible home environment steps to enter    Co-evaluation               AM-PAC PT "6 Clicks" Daily Activity  Outcome Measure Difficulty turning over in bed (including adjusting bedclothes, sheets and blankets)?: Unable Difficulty moving from lying on back to sitting on the side of the bed? : Unable Difficulty sitting down on and standing up from a chair with arms (e.g., wheelchair, bedside commode, etc,.)?: Unable Help needed moving to and from a bed to chair (including a wheelchair)?: A Little Help needed walking in hospital room?: A Little Help needed climbing 3-5 steps with a railing? : A Little 6 Click Score: 12    End of Session Equipment Utilized During Treatment: Oxygen;Gait belt Activity Tolerance: Patient limited by fatigue;Treatment limited secondary to medical complications (Comment)(SpO2 dropping. ) Patient left: in chair;with nursing/sitter in room;with family/visitor present   PT Visit Diagnosis: Unsteadiness on feet (R26.81);Difficulty in walking, not elsewhere classified (R26.2);Muscle weakness (generalized) (M62.81)    Time: 4580-9983 PT Time Calculation (min) (ACUTE ONLY): 36 min   Charges:   PT Evaluation $PT Eval High Complexity: 1 High PT Treatments $Therapeutic Activity: 8-22 mins        11:08 AM, 03/07/18 Etta Grandchild, PT, DPT Physical Therapist - [facility]  [phone] ([place])    Tavaras Goody C 03/07/2018, 11:07 AM

## 2018-03-08 LAB — BASIC METABOLIC PANEL
Anion gap: 5 (ref 5–15)
BUN: 33 mg/dL — AB (ref 8–23)
CALCIUM: 8.3 mg/dL — AB (ref 8.9–10.3)
CO2: 30 mmol/L (ref 22–32)
CREATININE: 1.4 mg/dL — AB (ref 0.61–1.24)
Chloride: 106 mmol/L (ref 98–111)
GFR calc Af Amer: 51 mL/min — ABNORMAL LOW (ref >60)
GFR, EST NON AFRICAN AMERICAN: 44 mL/min — AB (ref >60)
GLUCOSE: 180 mg/dL — AB (ref 70–99)
Potassium: 4.7 mmol/L (ref 3.5–5.1)
SODIUM: 141 mmol/L (ref 135–145)

## 2018-03-08 LAB — CBC
HCT: 36.9 % — ABNORMAL LOW (ref 39.0–52.0)
Hemoglobin: 11 g/dL — ABNORMAL LOW (ref 13.0–17.0)
MCH: 27.9 pg (ref 26.0–34.0)
MCHC: 29.8 g/dL — ABNORMAL LOW (ref 30.0–36.0)
MCV: 93.7 fL (ref 80.0–100.0)
PLATELETS: 150 10*3/uL (ref 150–400)
RBC: 3.94 MIL/uL — AB (ref 4.22–5.81)
RDW: 15.8 % — AB (ref 11.5–15.5)
WBC: 8.6 10*3/uL (ref 4.0–10.5)
nRBC: 0 % (ref 0.0–0.2)

## 2018-03-08 LAB — GLUCOSE, CAPILLARY: GLUCOSE-CAPILLARY: 126 mg/dL — AB (ref 70–99)

## 2018-03-08 MED ORDER — PREDNISONE 50 MG PO TABS: 50.0000 mg | ORAL_TABLET | Freq: Every day | ORAL | 0 refills | Status: DC

## 2018-03-08 MED ORDER — PREDNISONE 50 MG PO TABS
50.0000 mg | ORAL_TABLET | Freq: Every day | ORAL | Status: DC
  Administered 2018-03-08: 08:00:00 50 mg via ORAL
  Filled 2018-03-08: qty 1

## 2018-03-08 NOTE — Discharge Instructions (Signed)
It was so nice to meet you during this hospitalization!  You came into the hospital for shortness of breath. We think you had a flare of your pulmonary fibrosis. We treated you with IV steroids. I have prescribed Prednisone for you to take once daily at home for 2 more days (starting tomorrow).  We have also discharged you home with oxygen to use as needed for low oxygen saturations.  Please make sure you follow-up with your pulmonologist and primary care physician.  I wish you the best, Dr. Brett Albino

## 2018-03-08 NOTE — Care Management Note (Signed)
Case Management Note  Patient Details  Name: Glenn Reeves MRN: [id number] Date of Birth: 07/12/1930  Subjective/Objective:    Admitted to [facility] with the diagnosis of acute hypoxemic. Lives with [place]. Currently living with daughter Esaw Grandchild [phone]) and son -in law. Last seen Dr, Ramonita Lab in September. Prescriptions are filled at [facility], Currently has [facility] in the home. [facility] for rehabilitation in the past. No home oxygen, Shower chair, rolling walker, rollayor, wheelchair, and bedside commode in the home.  Self feed, self dress, needs help getting in and out of shower. [place] prior to this admission. Decreased weight for a whille.   Family will transport.               Action/Plan: Would like to resume services with [facility]. Will update Verda Cumins representative. Qualifies for home oxygen Would like [facility] for oxygen   Expected Discharge Date:  03/08/18               Expected Discharge Plan:     In-House Referral:   yes  Discharge planning Services   yes  Post Acute Care Choice:   yes Choice offered to:   daughter  DME Arranged:   yes DME Agency:   [facility]  HH Arranged:   Yes HH Agency:   [facility]  Status of Service:     If discussed at [place] of Stay Meetings, dates discussed:    Additional Comments:  Shelbie Ammons, RN MSN CCM Care Management [phone] 03/08/2018, 9:52 AM

## 2018-03-08 NOTE — Progress Notes (Signed)
SATURATION QUALIFICATIONS: (This note is used to comply with regulatory documentation for home oxygen)  Patient Saturations on Room Air at Rest = 86%  Patient Saturations on Room Air while Ambulating =   Patient Saturations on  Liters of oxygen while Ambulating =  Please briefly explain why patient needs home oxygen:

## 2018-03-08 NOTE — Discharge Summary (Signed)
[facility] [place] NAME: Glenn Reeves    MR#:  [id number]  DATE OF BIRTH:  1930/08/25  DATE OF ADMISSION:  03/05/2018   ADMITTING PHYSICIAN: Arta Silence, MD  DATE OF DISCHARGE: 03/08/18  PRIMARY CARE PHYSICIAN: Tama High III, MD   ADMISSION DIAGNOSIS:  Bradycardia [R00.1] Hypoxia [R09.02] Hematoma of scalp, initial encounter [S00.03XA] Fall, initial encounter [W19.XXXA] Hypotension, unspecified hypotension type [I95.9] DISCHARGE DIAGNOSIS:  Active Problems:   Acute hypoxemic respiratory failure (HCC)   Pressure injury of skin   Protein-calorie malnutrition, severe  SECONDARY DIAGNOSIS:   Past Medical History:  Diagnosis Date  . Atrial fibrillation ([place])   . CAD (coronary artery disease)   . Cancer (HCC)    squamous cell - left ear  . Diabetes mellitus without complication ([place])   . GI bleed   . Hypertension   . Pulmonary fibrosis ([place])   . Thyroid disease    HOSPITAL COURSE:   Glenn Reeves is an 84 year old male with known pulmonary fibrosis who presented to the ED with shortness of breath and a fall at home.  He had been attempting to ambulate, when he became very dyspneic.  He then became bradycardic and had an unwitnessed fall.  He was not responding immediately after the fall.  He was hypoxemic on arrival to the ED and required 2 L of oxygen. He was admitted for further management.  Acute on chronic hypoxic respiratory failure- likely secondary to pulmonary fibrosis flare. No infectious symptoms or infiltrates to suggest pneumonia. -Initially treated with IV solumedrol, then transitioned to prednisone for a total 5 day course -Required 2L O2 on admission, was able to come off O2 at rest for a few periods of time -Had desaturations to the 80s when ambulating and patient discharged home with 2 L O2 prn hypoxia -PT recommended HHPT, which was ordered on discharge  Fall and syncope- occurred prior to  admission.  Likely related to him becoming hypoxic and bradycardic.  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 Currently undergoing radiation with palliative intent.  -Continued home health RN for wound care on discharge  Type 2 diabetes-blood sugars well controlled -No changes made to insulin regimen  CKD 3-creatinine at baseline  Anemia of chronic disease- hemoglobin close to baseline  DISCHARGE CONDITIONS:  Chronic hypoxic respiratory failure Pulmonary fibrosis Fall Chronic left temporal scalp wound Invasive squamous cell carcinoma with bony involvement Type 2 diabetes CKD 3 Anemia of chronic disease CONSULTS OBTAINED:  Treatment Team:  Arta Silence, MD DRUG ALLERGIES:   Allergies  Allergen Reactions  . Codeine Rash   DISCHARGE MEDICATIONS:   Allergies as of 03/08/2018      Reactions   Codeine Rash      Medication List    TAKE these medications   carvedilol 6.25 MG tablet Commonly known as:  COREG Take 6.25 mg by mouth 2 (two) times daily.   finasteride 5 MG tablet Commonly known as:  PROSCAR Take 5 mg by mouth daily.   furosemide 20 MG tablet Commonly known as:  LASIX Take 20 mg by mouth every other day.   HUMALOG KWIKPEN 100 UNIT/ML KwikPen Generic drug:  insulin lispro Inject 12 Units into the skin See admin instructions. Inject 12u under the skin with meals as directed for blood sugar over 150   ipratropium-albuterol 0.5-2.5 (3) MG/3ML Soln  Commonly known as:  DUONEB Inhale 3 mLs into the lungs every evening.   LEVEMIR FLEXTOUCH 100 UNIT/ML Pen Generic drug:  Insulin Detemir Inject 5 Units into the skin at bedtime.   levothyroxine 75 MCG tablet Commonly known as:  SYNTHROID, LEVOTHROID Take 75 mcg by mouth daily.   montelukast 10 MG tablet Commonly known as:  SINGULAIR Take  1 tablet by mouth daily.   pantoprazole 40 MG tablet Commonly known as:  PROTONIX Take 40 mg by mouth daily.   polycarbophil 625 MG tablet Commonly known as:  FIBERCON Take 625 mg by mouth daily.   predniSONE 50 MG tablet Commonly known as:  DELTASONE Take 1 tablet (50 mg total) by mouth daily with breakfast.   rosuvastatin 20 MG tablet Commonly known as:  CRESTOR Take 10 mg by mouth every evening.   tamsulosin 0.4 MG Caps capsule Commonly known as:  FLOMAX Take 0.4 mg by mouth daily.            Durable Medical Equipment  (From admission, onward)         Start     Ordered   03/08/18 0835  DME Oxygen  Once    Question Answer Comment  Mode or (Route) Nasal cannula   Liters per Minute 2   Frequency Continuous (stationary and portable oxygen unit needed)   Oxygen delivery system Gas      03/08/18 0835           DISCHARGE INSTRUCTIONS:  1.  Follow-up with PCP in 1 to 2 weeks 2.  Follow-up with pulmonologist in 2 weeks 3.  Patient sent home on 2 L oxygen as needed for hypoxia DIET:  Cardiac diet and Diabetic diet DISCHARGE CONDITION:  Stable ACTIVITY:  Activity as tolerated OXYGEN:  Home Oxygen: Yes.    Oxygen Delivery: 2 liters/min via Patient connected to nasal cannula oxygen DISCHARGE LOCATION:  home   If you experience worsening of your admission symptoms, develop shortness of breath, life threatening emergency, suicidal or homicidal thoughts you must seek medical attention immediately by calling 911 or calling your MD immediately  if symptoms less severe.  You Must read complete instructions/literature along with all the possible adverse reactions/side effects for all the Medicines you take and that have been prescribed to you. Take any new Medicines after you have completely understood and accpet all the possible adverse reactions/side effects.   Please note  You were cared for by a hospitalist during your hospital stay. If you have any questions  about your discharge medications or the care you received while you were in the hospital after you are discharged, you can call the unit and asked to speak with the hospitalist on call if the hospitalist that took care of you is not available. Once you are discharged, your primary care physician will handle any further medical issues. Please note that NO REFILLS for any discharge medications will be authorized once you are discharged, as it is imperative that you return to your primary care physician (or establish a relationship with a primary care physician if you do not have one) for your aftercare needs so that they can reassess your need for medications and monitor your lab values.    On the day of Discharge:  VITAL SIGNS:  Blood pressure 117/60, pulse 74, temperature 97.9 F (36.6 C), temperature source Oral, resp. rate 16, height 5\' 8"  (1.727 m), weight 77.3 kg, SpO2 98 %. PHYSICAL EXAMINATION:  GENERAL:  84 y.o.-year-old patient lying in  the bed with no acute distress.  EYES: Pupils equal, round, reactive to light and accommodation. No scleral icterus. Extraocular muscles intact.  HEENT: Head atraumatic, normocephalic. Oropharynx and nasopharynx clear.  NECK:  Supple, no jugular venous distention. No thyroid enlargement, no tenderness.  LUNGS: +diminished breath sounds throughout all lung fields, no wheezing, rales,rhonchi or crepitation. No use of accessory muscles of respiration.  CARDIOVASCULAR: RRR, S1, S2 normal. No murmurs, rubs, or gallops.  ABDOMEN: Soft, non-tender, non-distended. Bowel sounds present. No organomegaly or mass.  EXTREMITIES: No pedal edema, cyanosis, or clubbing.  NEUROLOGIC: Cranial nerves II through XII are intact. +global weakness. Sensation intact. Gait not checked.  PSYCHIATRIC: The patient is alert and oriented x 3.  SKIN: No obvious rash, lesion, or ulcer.  DATA REVIEW:   CBC Recent Labs  Lab 03/08/18 0421  WBC 8.6  HGB 11.0*  HCT 36.9*  PLT 150     Chemistries  Recent Labs  Lab 03/05/18 2154 03/06/18 0630  03/08/18 0421  NA 142  --    < > 141  K 4.8  --    < > 4.7  CL 106  --    < > 106  CO2 27  --    < > 30  GLUCOSE 190*  --    < > 180*  BUN 27*  --    < > 33*  CREATININE 1.56*  --    < > 1.40*  CALCIUM 8.2*  --    < > 8.3*  MG  --  2.1  --   --   AST 31  --   --   --   ALT 15  --   --   --   ALKPHOS 64  --   --   --   BILITOT 0.8  --   --   --    < > = values in this interval not displayed.     Microbiology Results  Results for orders placed or performed during the hospital encounter of 03/05/18  MRSA PCR Screening     Status: Abnormal   Collection Time: 03/06/18  3:48 AM  Result Value Ref Range Status   MRSA by PCR POSITIVE (A) NEGATIVE Final    Comment:        The GeneXpert MRSA Assay (FDA approved for NASAL specimens only), is one component of a comprehensive MRSA colonization surveillance program. It is not intended to diagnose MRSA infection nor to guide or monitor treatment for MRSA infections. RESULT CALLED TO, READ BACK BY AND VERIFIED WITH: KASEY BAUGHANS @0625  ON 03/06/18 BY HKP Performed at [facility], [address], [place], [place] [address]     RADIOLOGY:  No results found.   Management plans discussed with the patient, family and they are in agreement.  CODE STATUS: Full Code   TOTAL TIME TAKING CARE OF THIS PATIENT: 35 minutes.    Berna Spare Chamille Werntz M.D on 03/08/2018 at 8:39 AM  Between 7am to 6pm - Pager - [phone]  After 6pm go to [email or web address] - password [email or web address] [facility]  [facility]  Office  [phone]  CC: Primary care physician; Adin Hector, MD   Note: This dictation was prepared with Dragon dictation along with smaller phrase technology. Any transcriptional errors that result from this process are unintentional.

## 2018-03-09 LAB — BLOOD GAS, VENOUS
Acid-Base Excess: 3 mmol/L — ABNORMAL HIGH (ref 0.0–2.0)
Bicarbonate: 31.5 mmol/L — ABNORMAL HIGH (ref 20.0–28.0)
PATIENT TEMPERATURE: 37
PCO2 VEN: 67 mmHg — AB (ref 44.0–60.0)
PH VEN: 7.28 (ref 7.250–7.430)

## 2018-03-09 LAB — GLUCOSE, CAPILLARY: Glucose-Capillary: 132 mg/dL — ABNORMAL HIGH (ref 70–99)

## 2018-03-11 ENCOUNTER — Ambulatory Visit
Admission: RE | Admit: 2018-03-11 | Discharge: 2018-03-11 | Disposition: A | Discharge status: Home / Self Care | Payer: Medicare Other | Source: Ambulatory Visit | Attending: Radiation Oncology | Admitting: Radiation Oncology

## 2018-03-11 DIAGNOSIS — Z510 Encounter for antineoplastic radiation therapy: Secondary | ICD-10-CM | POA: Insufficient documentation

## 2018-03-11 DIAGNOSIS — C4492 Squamous cell carcinoma of skin, unspecified: Secondary | ICD-10-CM | POA: Diagnosis not present

## 2018-03-15 ENCOUNTER — Other Ambulatory Visit: Payer: Self-pay

## 2018-03-15 ENCOUNTER — Encounter: Payer: Self-pay | Admitting: Oncology

## 2018-03-15 ENCOUNTER — Inpatient Hospital Stay (HOSPITAL_BASED_OUTPATIENT_CLINIC_OR_DEPARTMENT_OTHER): Payer: Medicare Other | Admitting: Oncology

## 2018-03-15 ENCOUNTER — Inpatient Hospital Stay: Payer: Medicare Other | Attending: Oncology

## 2018-03-15 VITALS — BP 104/57 | HR 72 | Temp 96.9°F

## 2018-03-15 DIAGNOSIS — Z794 Long term (current) use of insulin: Secondary | ICD-10-CM

## 2018-03-15 DIAGNOSIS — I2510 Atherosclerotic heart disease of native coronary artery without angina pectoris: Secondary | ICD-10-CM | POA: Insufficient documentation

## 2018-03-15 DIAGNOSIS — Z7189 Other specified counseling: Secondary | ICD-10-CM

## 2018-03-15 DIAGNOSIS — I129 Hypertensive chronic kidney disease with stage 1 through stage 4 chronic kidney disease, or unspecified chronic kidney disease: Secondary | ICD-10-CM | POA: Insufficient documentation

## 2018-03-15 DIAGNOSIS — I4891 Unspecified atrial fibrillation: Secondary | ICD-10-CM | POA: Insufficient documentation

## 2018-03-15 DIAGNOSIS — Z87891 Personal history of nicotine dependence: Secondary | ICD-10-CM | POA: Insufficient documentation

## 2018-03-15 DIAGNOSIS — Z79899 Other long term (current) drug therapy: Secondary | ICD-10-CM | POA: Diagnosis not present

## 2018-03-15 DIAGNOSIS — R5383 Other fatigue: Secondary | ICD-10-CM | POA: Diagnosis not present

## 2018-03-15 DIAGNOSIS — E079 Disorder of thyroid, unspecified: Secondary | ICD-10-CM | POA: Insufficient documentation

## 2018-03-15 DIAGNOSIS — J8410 Pulmonary fibrosis, unspecified: Secondary | ICD-10-CM

## 2018-03-15 DIAGNOSIS — N183 Chronic kidney disease, stage 3 (moderate): Secondary | ICD-10-CM | POA: Insufficient documentation

## 2018-03-15 DIAGNOSIS — C44329 Squamous cell carcinoma of skin of other parts of face: Secondary | ICD-10-CM | POA: Diagnosis present

## 2018-03-15 DIAGNOSIS — E119 Type 2 diabetes mellitus without complications: Secondary | ICD-10-CM

## 2018-03-15 DIAGNOSIS — Z515 Encounter for palliative care: Secondary | ICD-10-CM | POA: Diagnosis not present

## 2018-03-15 DIAGNOSIS — K5900 Constipation, unspecified: Secondary | ICD-10-CM | POA: Diagnosis not present

## 2018-03-15 DIAGNOSIS — M4802 Spinal stenosis, cervical region: Secondary | ICD-10-CM | POA: Insufficient documentation

## 2018-03-15 DIAGNOSIS — C4492 Squamous cell carcinoma of skin, unspecified: Secondary | ICD-10-CM

## 2018-03-15 DIAGNOSIS — L03114 Cellulitis of left upper limb: Secondary | ICD-10-CM | POA: Insufficient documentation

## 2018-03-15 DIAGNOSIS — R634 Abnormal weight loss: Secondary | ICD-10-CM | POA: Insufficient documentation

## 2018-03-15 LAB — CBC WITH DIFFERENTIAL/PLATELET
Abs Immature Granulocytes: 0.04 10*3/uL (ref 0.00–0.07)
BASOS PCT: 0 %
Basophils Absolute: 0 10*3/uL (ref 0.0–0.1)
EOS ABS: 0.5 10*3/uL (ref 0.0–0.5)
EOS PCT: 5 %
HCT: 38.4 % — ABNORMAL LOW (ref 39.0–52.0)
Hemoglobin: 11.6 g/dL — ABNORMAL LOW (ref 13.0–17.0)
Immature Granulocytes: 0 %
Lymphocytes Relative: 16 %
Lymphs Abs: 1.5 10*3/uL (ref 0.7–4.0)
MCH: 27.6 pg (ref 26.0–34.0)
MCHC: 30.2 g/dL (ref 30.0–36.0)
MCV: 91.4 fL (ref 80.0–100.0)
MONO ABS: 0.7 10*3/uL (ref 0.1–1.0)
MONOS PCT: 7 %
NEUTROS PCT: 72 %
Neutro Abs: 6.6 10*3/uL (ref 1.7–7.7)
Platelets: 156 10*3/uL (ref 150–400)
RBC: 4.2 MIL/uL — ABNORMAL LOW (ref 4.22–5.81)
RDW: 15.4 % (ref 11.5–15.5)
WBC: 9.4 10*3/uL (ref 4.0–10.5)
nRBC: 0 % (ref 0.0–0.2)

## 2018-03-15 MED ORDER — CEPHALEXIN 500 MG PO CAPS: 500.0000 mg | ORAL_CAPSULE | Freq: Two times a day (BID) | ORAL | 0 refills | Status: DC

## 2018-03-15 MED ORDER — DRONABINOL 5 MG PO CAPS: 5.0000 mg | ORAL_CAPSULE | Freq: Two times a day (BID) | ORAL | 0 refills | Status: AC

## 2018-03-15 NOTE — Progress Notes (Signed)
Patient here for follow up. He was in the hospital last week due to fall and unresponsiveness and bradycardia. Pt was placed on 2L of continuous oxygen.

## 2018-03-17 DIAGNOSIS — L03114 Cellulitis of left upper limb: Secondary | ICD-10-CM | POA: Insufficient documentation

## 2018-03-17 DIAGNOSIS — R634 Abnormal weight loss: Secondary | ICD-10-CM | POA: Insufficient documentation

## 2018-03-17 NOTE — Progress Notes (Addendum)
Hematology/Oncology Consult note [facility] Telephone:([phone] [phone]   Patient Care Team: Adin Hector, MD as PCP - General (Internal Medicine)  REFERRING PROVIDER: Adin Hector, MD CHIEF COMPLAINTS/REASON FOR VISIT:  Evaluation of squamous cell carcinoma of the skin  HISTORY OF PRESENTING ILLNESS:  Glenn Reeves is a  84 y.o.  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  He had multiple skin resection procedures for SCC removal of left ear, multiple area on face, left scalp and left forehead.  I do not have his previous medical records from [place]. Reviewed patient's medical records in [facility] via careeverywhere.  He previously underwent resection of squamous cell carcinoma of left forehead in [place], deep margin of the left temporal wound was reported positive.  He was seen and evaluated by Dr.Puscas in [facility] in January 2019. Treatment options ie Mohs vs wide local excision were discussed at that time and decision was made to proceed with wide local resection of left temple SCC and wedge resection of left lowe lip SCC. However his surgery was put off due to weather and ultimately patient elected not to follow through with it.   Recently he presented to ER for evaluation after a fall. CT head and maxillofacial was done which revealed no acute traumatic abnormaltiy however showed large left frontal scalp and calvarium mass measuring up to 8.8cm x 3.5cm with extension erosion of the left frontal calcarium, left frontal sinus, left maxillary process, and superior and lateral walled of the left orbit.   Patient also has lost 10 pounds weight, feels very weak. Accompanied by his daughter who he lives with.  Chronic history of lung fibrosis, HTN, DM, CAD, Atrial fibrillation, hearing impairment.   # Case was presented on tumor  board on 01/03/2018. Consensus was to do palliative RT.   Data Reviewed 04/10/2018 CT neck soft tissue w contrast No metastatic disease or lymphadenopathy in the parotid glands. Pathology consultation at [facility] # A. Outside case, [id number], [facility] [place], [place]. Date of procedure 01-31-17: 1. Left central forehead, shave removal and destruction: SQUAMOUS CELL CARCINOMA, INVASIVE, BIOPSY. 2. Left central forehead, shave removal and destruction: SQUAMOUS CELL CARCINOMA, INVASIVE, BIOPSY. SEE NOTE. NOTE: Sections show proliferation of atypical keratinocytes infiltrating in the dermis. Per outside report the atypical cells are positive for AE1/AE3 and P63. S100 and Melan A stains are non contributory. 3. Left inferior vermilion lip, shave removal and destruction: SQUAMOUS CELL CARCINOMA, INVASIVE, BIOPSY.  INTERVAL HISTORY Glenn Reeves is a 84 y.o. male who has above history reviewed by me today presents for follow up visit for management of locally advanced squamous cell carcinoma of the skin.  #Patient had a recent admission from 03/05/2018 to 03/08/2018 due to a fall at home and also feeling shortness of breath. Patient attempted to ambulate when he became very shortness of breath.  Fall is unwitnessed.  Patient required oxygen upon arrival to emergency room.  He had acute on chronic hypoxic respiratory failure and was diagnosed with supportive measures with IV steroids, oxygen.  He desaturated to 80s with ambulation and patient was discharged home with 2 L of oxygen PRN. His fall and syncope likely due to being hypoxic and bradycardic.  CT head and CT C-spine did not reveal acute process.  Echo was done which was unremarkable.  Cardiac monitoring showed  no arrhythmia.  Patient also has underwent palliative radiation with last radiation scheduled on 04/12/2018. Today he is accompanied by his daughter and son-in-law, who is a Stage manager. Patient is a poor  historian due to advanced age, hearing loss.  History obtained from patient's family members. Patient's daughter was concerned about an area on patient's left elbow.  Patient had abrasion of left elbow when he fell.  The area has becoming more erythematous and swelling, developed yellowish discharges over the past few days.  Patient has home health care and RN evaluate patient elbow a few days ago.  Today patient also weak and not able to stand to have his weight measured.  Review of Systems  Constitutional: Positive for malaise/fatigue and weight loss. Negative for chills and fever.  HENT: Positive for hearing loss. Negative for nosebleeds and sore throat.   Eyes: Negative for double vision, photophobia and redness.       Impaired left eye vision.  Respiratory: Negative for cough, shortness of breath and wheezing.   Cardiovascular: Negative for chest pain, palpitations, orthopnea and leg swelling.  Gastrointestinal: Negative for abdominal pain, blood in stool, nausea and vomiting.  Genitourinary: Negative for dysuria.  Musculoskeletal: Negative for back pain, myalgias and neck pain.       Left elbow abrasion/wound/swelling  Skin: Negative for itching and rash.       Chronic left termproal/left eye skin lesion.   Neurological: Negative for dizziness, tingling, tremors and headaches.  Endo/Heme/Allergies: Negative for environmental allergies. Does not bruise/bleed easily.  Psychiatric/Behavioral: Negative for depression and hallucinations.    MEDICAL HISTORY:  Past Medical History:  Diagnosis Date  . Atrial fibrillation ([place])   . CAD (coronary artery disease)   . Cancer (HCC)    squamous cell - left ear  . Diabetes mellitus without complication ([place])   . GI bleed   . Hypertension   . Pulmonary fibrosis ([place])   . Thyroid disease     SURGICAL HISTORY: Past Surgical History:  Procedure Laterality Date  . ESOPHAGOGASTRODUODENOSCOPY (EGD) WITH PROPOFOL N/A 06/29/2016   Procedure:  ESOPHAGOGASTRODUODENOSCOPY (EGD) WITH PROPOFOL;  Surgeon: [name], MD;  Location: [facility] ENDOSCOPY;  Service: Endoscopy;  Laterality: N/A;    SOCIAL HISTORY: Social History   Socioeconomic History  . Marital status: Widowed    Spouse name: Not on file  . Number of children: Not on file  . Years of education: Not on file  . Highest education level: Not on file  Occupational History  . Not on file  Social Needs  . Financial resource strain: Patient refused  . Food insecurity:    Worry: Patient refused    Inability: Patient refused  . Transportation needs:    Medical: Patient refused    Non-medical: Patient refused  Tobacco Use  . Smoking status: Former Smoker    Types: Cigarettes    Last attempt to quit: 01/03/1988    Years since quitting: 30.2  . Smokeless tobacco: Never Used  . Tobacco comment: smoked in the winter  Substance and Sexual Activity  . Alcohol use: No  . Drug use: No  . Sexual activity: Not on file  Lifestyle  . Physical activity:    Days per week: Patient refused    Minutes per session: Patient refused  . Stress: Not on file  Relationships  . Social connections:    Talks on phone: Patient refused    Gets together: Patient refused    Attends religious service: Patient refused    Active  member of club or organization: Patient refused    Attends meetings of clubs or organizations: Patient refused    Relationship status: Patient refused  . Intimate partner violence:    Fear of current or ex partner: Patient refused    Emotionally abused: Patient refused    Physically abused: Patient refused    Forced sexual activity: Patient refused  Other Topics Concern  . Not on file  Social History Narrative  . Not on file    FAMILY HISTORY: Family History  Problem Relation Age of Onset  . Heart attack Mother   . Heart attack Father   . Heart disease Brother   . Stroke Brother   . Autoimmune disease Neg Hx     ALLERGIES:  is allergic to  codeine.  MEDICATIONS:  Current Outpatient Medications  Medication Sig Dispense Refill  . carvedilol (COREG) 6.25 MG tablet Take 6.25 mg by mouth 2 (two) times daily.    . finasteride (PROSCAR) 5 MG tablet Take 5 mg by mouth daily.    Marland Kitchen ipratropium-albuterol (DUONEB) 0.5-2.5 (3) MG/3ML SOLN Inhale 3 mLs into the lungs every evening.     Marland Kitchen levothyroxine (SYNTHROID, LEVOTHROID) 75 MCG tablet Take 75 mcg by mouth daily.    . montelukast (SINGULAIR) 10 MG tablet Take 1 tablet by mouth daily.    . pantoprazole (PROTONIX) 40 MG tablet Take 40 mg by mouth daily.    . polycarbophil (FIBERCON) 625 MG tablet Take 625 mg by mouth daily.     . rosuvastatin (CRESTOR) 20 MG tablet Take 10 mg by mouth every evening.     . tamsulosin (FLOMAX) 0.4 MG CAPS capsule Take 0.4 mg by mouth daily.     . cephALEXin (KEFLEX) 500 MG capsule Take 1 capsule (500 mg total) by mouth 2 (two) times daily. 14 capsule 0  . dronabinol (MARINOL) 5 MG capsule Take 1 capsule (5 mg total) by mouth 2 (two) times daily before a meal. 60 capsule 0  . furosemide (LASIX) 20 MG tablet Take 20 mg by mouth every other day.     Marland Kitchen HUMALOG KWIKPEN 100 UNIT/ML KiwkPen Inject 12 Units into the skin See admin instructions. Inject 12u under the skin with meals as directed for blood sugar over 150    . LEVEMIR FLEXTOUCH 100 UNIT/ML Pen Inject 5 Units into the skin at bedtime.     . predniSONE (DELTASONE) 50 MG tablet Take 1 tablet (50 mg total) by mouth daily with breakfast. (Patient not taking: Reported on 03/15/2018) 2 tablet 0   No current facility-administered medications for this visit.      PHYSICAL EXAMINATION: ECOG PERFORMANCE STATUS: 3 - Symptomatic, >50% confined to bed Vitals:   03/15/18 1332  BP: (!) 104/57  Pulse: 72  Temp: (!) 96.9 F (36.1 C)  SpO2: 98%   Filed Weights    Physical Exam  Constitutional: He is oriented to person, place, and time. No distress.  HENT:  Head: Normocephalic and atraumatic.  Mouth/Throat:  Oropharynx is clear and moist.  Post surgical changes of left ear.  left forehead and temporal scalp open wound.   Eyes: Pupils are equal, round, and reactive to light. EOM are normal. No scleral icterus.  periorbital edema with ptosis of the eye on the left.  Neck: Normal range of motion. Neck supple.  Cardiovascular: Normal rate, regular rhythm and normal heart sounds.  Pulmonary/Chest: Effort normal. No respiratory distress. He has no wheezes.  Abdominal: Soft. Bowel sounds are normal. He exhibits  no distension. There is no tenderness.  Musculoskeletal: Normal range of motion. He exhibits no edema or deformity.  Left elbow soft tissue erythema with yellowish discharges. Elbow motion not limited.  No pain with motion.  Neurological: He is alert and oriented to person, place, and time. No cranial nerve deficit. Coordination normal.  Skin: Skin is warm and dry. No rash noted. No erythema.  Psychiatric: He has a normal mood and affect. His behavior is normal. Thought content normal.           LABORATORY DATA:  I have reviewed the data as listed Lab Results  Component Value Date   WBC 9.4 03/15/2018   HGB 11.6 (L) 03/15/2018   HCT 38.4 (L) 03/15/2018   MCV 91.4 03/15/2018   PLT 156 03/15/2018   Recent Labs    12/26/17 1732 03/05/18 2154 03/07/18 0444 03/08/18 0421  NA 140 142 141 141  K 4.3 4.8 4.4 4.7  CL 103 106 107 106  CO2 30 27 29 30   GLUCOSE 187* 190* 143* 180*  BUN 24* 27* 28* 33*  CREATININE 1.40* 1.56* 1.42* 1.40*  CALCIUM 9.2 8.2* 8.2* 8.3*  GFRNONAA 44* 39* 43* 44*  GFRAA 51* 45* 50* 51*  PROT 7.0 7.0  --   --   ALBUMIN 2.7* 2.3*  --   --   AST 19 31  --   --   ALT 14 15  --   --   ALKPHOS 49 64  --   --   BILITOT 0.5 0.8  --   --    Iron/TIBC/Ferritin/ %Sat No results found for: IRON, TIBC, FERRITIN, IRONPCTSAT   RADIOGRAPHIC STUDIES: I have personally reviewed the radiological images as listed and agreed with the findings in the  report. 12/26/2017 CT head wo contrast and CT maxillofacial wo contrast.  1. No acute traumatic intracranial abnormality or maxillofacial fracture. 2. Large left frontal scalp and calvarium mass measuring up to 8.8 x 3.5 cm with extensive erosion of the left frontal calvarium, left frontal sinus, left maxillary process and the superior and lateral walls of the left orbit. The primary consideration is an invasive skin carcinoma. Other possibilities include orbital lymphoma (though this is unusually aggressive), a primary osseous tumor or metastasis from an unknown primary cancer; but these are all less likely. 3. Central hypoattenuation within the mass is most likely necrotic tumor. However, given the proximity of the dura, this could represent a contained CSF leak. There is inward bulging of the left frontal convexity dura, but no evidence of intradural extension of tumor.  ASSESSMENT & PLAN:  1. Goals of care, counseling/discussion   2. Squamous cell carcinoma of skin   3. Other fatigue   4. Weight loss   5. Cellulitis of left elbow   locally advanced squamous cell cacinoma with orbit and maxillary sinus invasion.  To complete radiation course.  Patient had a CT scan head during recent admission.  Patient's son-in-law ask about results. CT head was independently reviewed by me and discussed.  Respiratory facility showed no acute intracranial process.  There is a small left posterior scalp hematoma.  There is similar partial imaged left periorbital/left facial infiltrative soft tissue tumor resulting in unchanged 4 mm left to right midline shift.There are Nasal sinus mucosal thickening.  Soft tissue tumor invading left extra, fat, left proptosis  #Weakness and fatigue likely due to patient's multiple comorbidities and advanced age.  Weight loss/anorexia, will start trial of Marinol. Had a discussion of patient's  goals of care.  Daughter and son-in-law are not interested in any systemic  treatment. Patient will finish palliative radiation course.  They agree with not repeating images as not going to change management plan. Supportive care measures are necessary for patient well-being and will be provided as necessary. They are interested in discussion with palliative care service.  Will refer.  #Left elbow cellulitis, will give patient a course of Keflex.  If patient symptom worsened or spiked fever, advised patient's family members to seek medical advice immediately.  They voices understanding.  #Labs reviewed, hemoglobin 11.6, stable.    We discussed about involving palliative care service and daughter wants to hold off referral and discuss in the in future.    Orders Placed This Encounter  Procedures  . Ambulatory Referral to Palliative Care    Referral Priority:   Routine    Referral Type:   Consultation    Referral Reason:   Goals of Care    Referred to Provider:   Borders, Kirt Boys, NP    Number of Visits Requested:   1    All questions were answered. The patient knows to call the clinic with any problems questions or concerns.  Return of visit: As needed. Total face to face encounter time for this patient visit was 25 min. >50% of the time was  spent in counseling and coordination of care.     Earlie Server, MD, PhD Hematology Oncology [facility] at [facility] Pager- [phone]

## 2018-03-19 ENCOUNTER — Other Ambulatory Visit: Payer: Self-pay

## 2018-03-19 ENCOUNTER — Inpatient Hospital Stay (HOSPITAL_BASED_OUTPATIENT_CLINIC_OR_DEPARTMENT_OTHER): Payer: Medicare Other | Admitting: Hospice and Palliative Medicine

## 2018-03-19 ENCOUNTER — Encounter: Payer: Self-pay | Admitting: Hospice and Palliative Medicine

## 2018-03-19 ENCOUNTER — Ambulatory Visit: Admission: RE | Admit: 2018-03-19 | Payer: Medicare Other | Source: Ambulatory Visit

## 2018-03-19 VITALS — BP 86/47 | HR 78 | Temp 96.4°F | Resp 18 | Wt 170.0 lb

## 2018-03-19 DIAGNOSIS — L03114 Cellulitis of left upper limb: Secondary | ICD-10-CM

## 2018-03-19 DIAGNOSIS — R5383 Other fatigue: Secondary | ICD-10-CM

## 2018-03-19 DIAGNOSIS — I2510 Atherosclerotic heart disease of native coronary artery without angina pectoris: Secondary | ICD-10-CM

## 2018-03-19 DIAGNOSIS — Z515 Encounter for palliative care: Secondary | ICD-10-CM | POA: Diagnosis not present

## 2018-03-19 DIAGNOSIS — E46 Unspecified protein-calorie malnutrition: Secondary | ICD-10-CM

## 2018-03-19 DIAGNOSIS — E119 Type 2 diabetes mellitus without complications: Secondary | ICD-10-CM

## 2018-03-19 DIAGNOSIS — R634 Abnormal weight loss: Secondary | ICD-10-CM

## 2018-03-19 DIAGNOSIS — C44329 Squamous cell carcinoma of skin of other parts of face: Secondary | ICD-10-CM | POA: Diagnosis not present

## 2018-03-19 DIAGNOSIS — Z87891 Personal history of nicotine dependence: Secondary | ICD-10-CM

## 2018-03-19 DIAGNOSIS — E079 Disorder of thyroid, unspecified: Secondary | ICD-10-CM

## 2018-03-19 DIAGNOSIS — J8410 Pulmonary fibrosis, unspecified: Secondary | ICD-10-CM

## 2018-03-19 DIAGNOSIS — Z79899 Other long term (current) drug therapy: Secondary | ICD-10-CM

## 2018-03-19 DIAGNOSIS — M4802 Spinal stenosis, cervical region: Secondary | ICD-10-CM

## 2018-03-19 DIAGNOSIS — K5900 Constipation, unspecified: Secondary | ICD-10-CM

## 2018-03-19 DIAGNOSIS — C4492 Squamous cell carcinoma of skin, unspecified: Secondary | ICD-10-CM

## 2018-03-19 DIAGNOSIS — I129 Hypertensive chronic kidney disease with stage 1 through stage 4 chronic kidney disease, or unspecified chronic kidney disease: Secondary | ICD-10-CM

## 2018-03-19 DIAGNOSIS — I4891 Unspecified atrial fibrillation: Secondary | ICD-10-CM

## 2018-03-19 DIAGNOSIS — Z7189 Other specified counseling: Secondary | ICD-10-CM

## 2018-03-19 DIAGNOSIS — Z794 Long term (current) use of insulin: Secondary | ICD-10-CM

## 2018-03-19 DIAGNOSIS — N183 Chronic kidney disease, stage 3 (moderate): Secondary | ICD-10-CM

## 2018-03-19 NOTE — Progress Notes (Signed)
[place]  [phone] [phone]   Name: Glenn Reeves Date: 03/19/2018 MRN: [id number]  DOB: 04-14-1931  Patient Care Team: Adin Hector, MD as PCP - General (Internal Medicine)    REASON FOR CONSULTATION: Palliative Care consult requested for this 84 y.o. 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 He was recently hospitalized 03/05/18 to 03/08/18 with acute respiratory failure from exacerbation of pulmonary fibrosis. Patient has had progressive decline with weakness and weight loss. He is felt not to be a candidate for systemic therapy. However, he is receiving XRT. Patient was referred to palliative care to help address goals.    SOCIAL HISTORY:    Patient is widowed. He lives at home with his daughter and son-in-law.   ADVANCE DIRECTIVES:  Patient's daughter is his 39. He has a living will.   CODE STATUS: DNR  PAST MEDICAL HISTORY: Past Medical History:  Diagnosis Date  . Atrial fibrillation ([place])   . CAD (coronary artery disease)   . Cancer ([facility])    squamous cell - left ear  . Diabetes mellitus without complication ([place])   . GI bleed   . Hypertension   . Pulmonary fibrosis ([place])   . Thyroid disease     PAST SURGICAL HISTORY:  Past Surgical History:  Procedure Laterality Date  . ESOPHAGOGASTRODUODENOSCOPY (EGD) WITH PROPOFOL N/A 06/29/2016   Procedure: ESOPHAGOGASTRODUODENOSCOPY (EGD) WITH PROPOFOL;  Surgeon: [name], MD;  Location: [facility] ENDOSCOPY;  Service: Endoscopy;  Laterality: N/A;    HEMATOLOGY/ONCOLOGY HISTORY:   No history exists.    ALLERGIES:  is allergic to codeine.  MEDICATIONS:  Current Outpatient Medications  Medication Sig Dispense Refill  . carvedilol (COREG) 6.25 MG tablet Take 6.25 mg by mouth 2 (two) times daily.    . cephALEXin (KEFLEX) 500  MG capsule Take 1 capsule (500 mg total) by mouth 2 (two) times daily. 14 capsule 0  . dronabinol (MARINOL) 5 MG capsule Take 1 capsule (5 mg total) by mouth 2 (two) times daily before a meal. 60 capsule 0  . finasteride (PROSCAR) 5 MG tablet Take 5 mg by mouth daily.    . furosemide (LASIX) 20 MG tablet Take 20 mg by mouth every other day.     Marland Kitchen HUMALOG KWIKPEN 100 UNIT/ML KiwkPen Inject 12 Units into the skin See admin instructions. Inject 12u under the skin with meals as directed for blood sugar over 150    . ipratropium-albuterol (DUONEB) 0.5-2.5 (3) MG/3ML SOLN Inhale 3 mLs into the lungs every evening.     Marland Kitchen LEVEMIR FLEXTOUCH 100 UNIT/ML Pen Inject 5 Units into the skin at bedtime.     Marland Kitchen levothyroxine (SYNTHROID, LEVOTHROID) 75 MCG tablet Take 75 mcg by mouth daily.    . montelukast (SINGULAIR) 10 MG tablet Take 1 tablet by mouth daily.    . pantoprazole (PROTONIX) 40 MG tablet Take 40 mg by mouth daily.    . polycarbophil (FIBERCON) 625 MG tablet Take 625 mg by mouth daily.     . predniSONE (DELTASONE) 50 MG tablet Take 1 tablet (50 mg total) by mouth daily with breakfast. (Patient not taking: Reported on 03/15/2018) 2 tablet 0  . rosuvastatin (CRESTOR) 20 MG tablet Take 10 mg by mouth every evening.     . tamsulosin (FLOMAX) 0.4 MG CAPS capsule Take 0.4 mg by mouth daily.  No current facility-administered medications for this visit.     VITAL SIGNS: There were no vitals taken for this visit. There were no vitals filed for this visit.  Estimated body mass index is 25.92 kg/m as calculated from the following:   Height as of 03/05/18: '5\' 8"'  (1.727 m).   Weight as of 03/05/18: 170 lb 8 oz (77.3 kg).  LABS: CBC:    Component Value Date/Time   WBC 9.4 03/15/2018 1301   HGB 11.6 (L) 03/15/2018 1301   HCT 38.4 (L) 03/15/2018 1301   PLT 156 03/15/2018 1301   MCV 91.4 03/15/2018 1301   NEUTROABS 6.6 03/15/2018 1301   LYMPHSABS 1.5 03/15/2018 1301   MONOABS 0.7 03/15/2018 1301     EOSABS 0.5 03/15/2018 1301   BASOSABS 0.0 03/15/2018 1301   Comprehensive Metabolic Panel:    Component Value Date/Time   NA 141 03/08/2018 0421   K 4.7 03/08/2018 0421   CL 106 03/08/2018 0421   CO2 30 03/08/2018 0421   BUN 33 (H) 03/08/2018 0421   CREATININE 1.40 (H) 03/08/2018 0421   GLUCOSE 180 (H) 03/08/2018 0421   CALCIUM 8.3 (L) 03/08/2018 0421   AST 31 03/05/2018 2154   ALT 15 03/05/2018 2154   ALKPHOS 64 03/05/2018 2154   BILITOT 0.8 03/05/2018 2154   PROT 7.0 03/05/2018 2154   ALBUMIN 2.3 (L) 03/05/2018 2154    RADIOGRAPHIC STUDIES: Ct Head W Or Wo Contrast  Result Date: 03/05/2018 CLINICAL DATA:  Unwitnessed fall, found unresponsive. History of LEFT face skin cancer, hypertension, diabetes, atrial fibrillation pulmonary fibrosis. EXAM: CT HEAD WITH AND WITHOUT CONTRAST CT CERVICAL SPINE WITHOUT CONTRAST TECHNIQUE: Multidetector CT imaging of the head and cervical spine was performed following the standard protocol with and without intravenous contrast. Multiplanar CT image reconstructions of the cervical spine were also generated. CONTRAST:  60 cc Omnipaque 350 COMPARISON:  CT HEAD and cervical spine December 26, 2017, PET-CT January 11, 2018. FINDINGS: CT HEAD FINDINGS BRAIN: No intraparenchymal hemorrhage. Stable 4 mm LEFT-to-RIGHT midline shift. The ventricles and sulci are normal for age. Patchy supratentorial white matter hypodensities less than expected for patient's age, though non-specific are most compatible with chronic small vessel ischemic disease. No acute large vascular territory infarcts. No abnormal extra-axial fluid collections. Basal cisterns are patent. No abnormal intracranial enhancement. VASCULAR: Severe calcific atherosclerosis of the carotid siphons. SKULL: No skull fracture.  Small LEFT parietal scalp hematoma. SINUSES/ORBITS: Trace paranasal sinus mucosal thickening. Trace RIGHT mastoid effusion. Soft tissue tumor invading LEFT extraconal fat, LEFT  proptosis. Status post RIGHT ocular lens implant. OTHER: Partially imaged large destructive enhancing soft tissue mass within the LEFT periorbital, LEFT frontotemporal scalp with similar invasion of the LEFT frontal calvarium and LEFT frontal sinus. CT CERVICAL SPINE FINDINGS ALIGNMENT: Maintained lordosis. Vertebral bodies in alignment. SKULL BASE AND VERTEBRAE: Cervical vertebral bodies and posterior elements are intact. Severe C5-6, moderate to severe C4-5 and C6-7 disc height loss with endplate spurring and sclerosis consistent with degenerative disc. Bulky ventral osteophytes. No destructive bony lesions. C1-2 articulation maintained, moderate osteoarthrosis. C2-3 facets fused on degenerative basis. Multilevel moderate facet arthropathy. SOFT TISSUES AND SPINAL CANAL: Nonacute. Multiple surgical clips LEFT neck resulting in streak artifact. Severe calcific atherosclerosis RIGHT and possibly LEFT carotid bifurcations. DISC LEVELS: Severe canal stenosis C5-6. Severe LEFT C4-5, bilateral C5-6 and C6-7 neural foraminal narrowing. UPPER CHEST: Biapical fibrosis and worsening LEFT apical consolidation. OTHER: None. IMPRESSION: CT HEAD: 1. No acute intracranial process. Small LEFT posterior scalp hematoma. 2.  Similar partially imaged LEFT periorbital/LEFT facial infiltrative soft tissue tumor resulting in unchanged 4 mm LEFT to RIGHT midline shift, no CT findings of cerebral invasion. CT CERVICAL SPINE: 1. No fracture or malalignment. 2. Severe canal stenosis C5-6. Severe C4-5 through C6-7 neural foraminal narrowing. 3. Biapical fibrosis and worsening LEFT apical consolidation. Electronically Signed   By: Elon Alas M.D.   On: 03/05/2018 23:19   Ct Angio Chest Pe W And/or Wo Contrast  Result Date: 03/05/2018 CLINICAL DATA:  Unwitnessed fall. Patient found unresponsive with agonal breathing. EXAM: CT ANGIOGRAPHY CHEST WITH CONTRAST TECHNIQUE: Multidetector CT imaging of the chest was performed using the  standard protocol during bolus administration of intravenous contrast. Multiplanar CT image reconstructions and MIPs were obtained to evaluate the vascular anatomy. CONTRAST:  55m OMNIPAQUE IOHEXOL 350 MG/ML SOLN COMPARISON:  Chest CT 06/29/2016, head CT 01/11/2018 FINDINGS: Cardiovascular: 4.2 cm ascending thoracic aortic aneurysm, stable in appearance with diffuse moderate aortic atherosclerosis. Satisfactory opacification of the pulmonary arteries without acute pulmonary embolus. Cardiomegaly with coronary arteriosclerosis. No pericardial effusion or thickening. Mediastinum/Nodes: No enlarged mediastinal, hilar, or axillary lymph nodes. Thyroid gland, trachea, and esophagus demonstrate no significant findings. Lungs/Pleura: Interstitial pulmonary fibrosis with traction bronchiectasis noted bilaterally.Trace pleural effusions left greater than right pleural thickening. Upper Abdomen: Multiple splenic and hepatic granulomata. Surgical clips from prior cholecystectomy. Musculoskeletal: Diffuse idiopathic skeletal hyperostosis. Review of the MIP images confirms the above findings. IMPRESSION: 1. No acute pulmonary embolus. 2. Stable 4.2 cm ascending thoracic aortic aneurysm. Recommend annual imaging followup by CTA or MRA. This recommendation follows 2010 ACCF/AHA/AATS/ACR/ASA/SCA/SCAI/SIR/STS/SVM Guidelines for the Diagnosis and Management of Patients with Thoracic Aortic Disease. 2010; 121: [id number] 3. Interstitial pulmonary fibrosis with traction bronchiectasis and trace pleural effusions. 4. Old granulomatous disease. Aortic aneurysm NOS (ICD10-I71.9). Aortic Atherosclerosis (ICD10-I70.0). Electronically Signed   By: DAshley RoyaltyM.D.   On: 03/05/2018 23:29   Ct Cervical Spine Wo Contrast  Result Date: 03/05/2018 CLINICAL DATA:  Unwitnessed fall, found unresponsive. History of LEFT face skin cancer, hypertension, diabetes, atrial fibrillation pulmonary fibrosis. EXAM: CT HEAD WITH AND WITHOUT CONTRAST CT  CERVICAL SPINE WITHOUT CONTRAST TECHNIQUE: Multidetector CT imaging of the head and cervical spine was performed following the standard protocol with and without intravenous contrast. Multiplanar CT image reconstructions of the cervical spine were also generated. CONTRAST:  60 cc Omnipaque 350 COMPARISON:  CT HEAD and cervical spine December 26, 2017, PET-CT January 11, 2018. FINDINGS: CT HEAD FINDINGS BRAIN: No intraparenchymal hemorrhage. Stable 4 mm LEFT-to-RIGHT midline shift. The ventricles and sulci are normal for age. Patchy supratentorial white matter hypodensities less than expected for patient's age, though non-specific are most compatible with chronic small vessel ischemic disease. No acute large vascular territory infarcts. No abnormal extra-axial fluid collections. Basal cisterns are patent. No abnormal intracranial enhancement. VASCULAR: Severe calcific atherosclerosis of the carotid siphons. SKULL: No skull fracture.  Small LEFT parietal scalp hematoma. SINUSES/ORBITS: Trace paranasal sinus mucosal thickening. Trace RIGHT mastoid effusion. Soft tissue tumor invading LEFT extraconal fat, LEFT proptosis. Status post RIGHT ocular lens implant. OTHER: Partially imaged large destructive enhancing soft tissue mass within the LEFT periorbital, LEFT frontotemporal scalp with similar invasion of the LEFT frontal calvarium and LEFT frontal sinus. CT CERVICAL SPINE FINDINGS ALIGNMENT: Maintained lordosis. Vertebral bodies in alignment. SKULL BASE AND VERTEBRAE: Cervical vertebral bodies and posterior elements are intact. Severe C5-6, moderate to severe C4-5 and C6-7 disc height loss with endplate spurring and sclerosis consistent with degenerative disc. Bulky ventral osteophytes. No destructive bony  lesions. C1-2 articulation maintained, moderate osteoarthrosis. C2-3 facets fused on degenerative basis. Multilevel moderate facet arthropathy. SOFT TISSUES AND SPINAL CANAL: Nonacute. Multiple surgical clips LEFT  neck resulting in streak artifact. Severe calcific atherosclerosis RIGHT and possibly LEFT carotid bifurcations. DISC LEVELS: Severe canal stenosis C5-6. Severe LEFT C4-5, bilateral C5-6 and C6-7 neural foraminal narrowing. UPPER CHEST: Biapical fibrosis and worsening LEFT apical consolidation. OTHER: None. IMPRESSION: CT HEAD: 1. No acute intracranial process. Small LEFT posterior scalp hematoma. 2. Similar partially imaged LEFT periorbital/LEFT facial infiltrative soft tissue tumor resulting in unchanged 4 mm LEFT to RIGHT midline shift, no CT findings of cerebral invasion. CT CERVICAL SPINE: 1. No fracture or malalignment. 2. Severe canal stenosis C5-6. Severe C4-5 through C6-7 neural foraminal narrowing. 3. Biapical fibrosis and worsening LEFT apical consolidation. Electronically Signed   By: Elon Alas M.D.   On: 03/05/2018 23:19   Dg Chest Portable 1 View  Result Date: 03/05/2018 CLINICAL DATA:  Unwitnessed fall. EXAM: PORTABLE CHEST 1 VIEW COMPARISON:  12/26/2017 FINDINGS: The patient is slightly rotated to the right. There is mild cardiac enlargement in part due to portable technique and rotation. Chronic interstitial prominence consistent with interstitial lung disease is again noted with chronic elevation of right hemidiaphragm. No acute pulmonary consolidation, effusion or pneumothorax. No acute displaced rib fracture. Vascular clips are again noted at the base of the neck on left. IMPRESSION: 1. No active disease. 2. Diffuse interstitial prominence likely representing interstitial fibrosis and consistent with chronic interstitial lung disease is again noted with chronic elevation of right hemidiaphragm. Electronically Signed   By: Ashley Royalty M.D.   On: 03/05/2018 22:54    PERFORMANCE STATUS (ECOG) : 3 - Symptomatic, >50% confined to bed  Review of Systems As noted above. Otherwise, a complete review of systems is negative.  Physical Exam General: NAD, frail appearing, thin, in  wheelchair HEENT: L. Scleral injection, Wound to L. Forehead  Cardiovascular: regular rate and rhythm Pulmonary: poor air movement without wheeze, on O2 Abdomen: soft, nontender, + bowel sounds GU: no suprapubic tenderness Extremities: no edema, no joint deformities Neurological: Weakness but otherwise nonfocal  IMPRESSION: I met today with patient and his daughter in the clinic.  Patient is pending XRT later today with plan for radiation treatments through December.  Daughter reports the patient has been progressively declining with increasing weakness.  He is still ambulatory with use of a walker but is having difficulty standing.  He needs assistance with activities of daily living such as bathing and dressing.  He has home health services coming to the house but daughter says she is dissatisfied with [facility].  She asks to switch home health companies and thinks that she had good service in the past from [place].  We will see if we can send an order.  Daughter says she recognizes that patient's prognosis is poor and that he is likely approaching end-of-life.  We discussed the option of hospice involvement after XRT is complete.  Daughter would be interested in hospice care.  Patient has an advance care plan.  His daughter is his healthcare power of attorney.  He also has a living will.  Daughter says that patient would not want to be placed on machines or have feeding tubes.  I reviewed with her a MOST Form and she asked to take at home to speak with her husband, who is a physician, prior to completion.  Symptomatically, patient denies pain, nausea and vomiting, and shortness of breath.  He  has some constipation not entirely relieved with increased fiber.  I recommended addition of MiraLAX daily.  PLAN: MOST form reviewed Home health with future plan for hospice MiraLAX daily RTC in 2 weeks   Patient expressed understanding and was in agreement with this plan. He also  understands that He can call clinic at any time with any questions, concerns, or complaints.     Time Total:  30 minutes  Visit consisted of counseling and education dealing with the complex and emotionally intense issues of symptom management and palliative care in the setting of serious and potentially life-threatening illness.Greater than 50%  of this time was spent counseling and coordinating care related to the above assessment and plan.  Signed by: Altha Harm, PhD, NP-C [phone] (Work Cell)

## 2018-03-20 DIAGNOSIS — Z510 Encounter for antineoplastic radiation therapy: Secondary | ICD-10-CM | POA: Diagnosis not present

## 2018-03-25 ENCOUNTER — Ambulatory Visit: Payer: Medicare Other

## 2018-03-25 ENCOUNTER — Ambulatory Visit: Payer: Medicare Other | Admitting: Radiation Oncology

## 2018-03-25 ENCOUNTER — Ambulatory Visit
Admission: RE | Admit: 2018-03-25 | Discharge: 2018-03-25 | Disposition: A | Discharge status: Home / Self Care | Payer: Medicare Other | Source: Ambulatory Visit | Attending: Radiation Oncology | Admitting: Radiation Oncology

## 2018-03-25 DIAGNOSIS — Z510 Encounter for antineoplastic radiation therapy: Secondary | ICD-10-CM | POA: Diagnosis present

## 2018-03-25 DIAGNOSIS — C4492 Squamous cell carcinoma of skin, unspecified: Secondary | ICD-10-CM | POA: Insufficient documentation

## 2018-03-26 ENCOUNTER — Encounter: Payer: Medicare Other | Admitting: Hospice and Palliative Medicine

## 2018-03-26 ENCOUNTER — Ambulatory Visit
Admission: RE | Admit: 2018-03-26 | Discharge: 2018-03-26 | Disposition: A | Discharge status: Home / Self Care | Payer: Medicare Other | Source: Ambulatory Visit | Attending: Radiation Oncology | Admitting: Radiation Oncology

## 2018-03-26 DIAGNOSIS — Z510 Encounter for antineoplastic radiation therapy: Secondary | ICD-10-CM | POA: Diagnosis not present

## 2018-03-27 ENCOUNTER — Ambulatory Visit
Admission: RE | Admit: 2018-03-27 | Discharge: 2018-03-27 | Disposition: A | Discharge status: Home / Self Care | Payer: Medicare Other | Source: Ambulatory Visit | Attending: Radiation Oncology | Admitting: Radiation Oncology

## 2018-03-27 DIAGNOSIS — Z510 Encounter for antineoplastic radiation therapy: Secondary | ICD-10-CM | POA: Diagnosis not present

## 2018-03-28 ENCOUNTER — Ambulatory Visit
Admission: RE | Admit: 2018-03-28 | Discharge: 2018-03-28 | Disposition: A | Discharge status: Home / Self Care | Payer: Medicare Other | Source: Ambulatory Visit | Attending: Radiation Oncology | Admitting: Radiation Oncology

## 2018-03-28 DIAGNOSIS — Z510 Encounter for antineoplastic radiation therapy: Secondary | ICD-10-CM | POA: Diagnosis not present

## 2018-03-29 ENCOUNTER — Ambulatory Visit
Admission: RE | Admit: 2018-03-29 | Discharge: 2018-03-29 | Disposition: A | Discharge status: Home / Self Care | Payer: Medicare Other | Source: Ambulatory Visit | Attending: Radiation Oncology | Admitting: Radiation Oncology

## 2018-03-29 DIAGNOSIS — Z510 Encounter for antineoplastic radiation therapy: Secondary | ICD-10-CM | POA: Diagnosis not present

## 2018-04-01 ENCOUNTER — Ambulatory Visit
Admission: RE | Admit: 2018-04-01 | Discharge: 2018-04-01 | Disposition: A | Discharge status: Home / Self Care | Payer: Medicare Other | Source: Ambulatory Visit | Attending: Radiation Oncology | Admitting: Radiation Oncology

## 2018-04-01 DIAGNOSIS — Z510 Encounter for antineoplastic radiation therapy: Secondary | ICD-10-CM | POA: Diagnosis not present

## 2018-04-02 ENCOUNTER — Ambulatory Visit
Admission: RE | Admit: 2018-04-02 | Discharge: 2018-04-02 | Disposition: A | Discharge status: Home / Self Care | Payer: Medicare Other | Source: Ambulatory Visit | Attending: Radiation Oncology | Admitting: Radiation Oncology

## 2018-04-02 ENCOUNTER — Inpatient Hospital Stay: Payer: Medicare Other | Admitting: Hospice and Palliative Medicine

## 2018-04-02 DIAGNOSIS — Z510 Encounter for antineoplastic radiation therapy: Secondary | ICD-10-CM | POA: Diagnosis not present

## 2018-04-03 ENCOUNTER — Ambulatory Visit
Admission: RE | Admit: 2018-04-03 | Discharge: 2018-04-03 | Disposition: A | Discharge status: Home / Self Care | Payer: Medicare Other | Source: Ambulatory Visit | Attending: Radiation Oncology | Admitting: Radiation Oncology

## 2018-04-03 DIAGNOSIS — Z510 Encounter for antineoplastic radiation therapy: Secondary | ICD-10-CM | POA: Diagnosis not present

## 2018-04-04 ENCOUNTER — Ambulatory Visit
Admission: RE | Admit: 2018-04-04 | Discharge: 2018-04-04 | Disposition: A | Discharge status: Home / Self Care | Payer: Medicare Other | Source: Ambulatory Visit | Attending: Radiation Oncology | Admitting: Radiation Oncology

## 2018-04-04 DIAGNOSIS — Z510 Encounter for antineoplastic radiation therapy: Secondary | ICD-10-CM | POA: Diagnosis not present

## 2018-04-05 ENCOUNTER — Ambulatory Visit
Admission: RE | Admit: 2018-04-05 | Discharge: 2018-04-05 | Disposition: A | Discharge status: Home / Self Care | Payer: Medicare Other | Source: Ambulatory Visit | Attending: Radiation Oncology | Admitting: Radiation Oncology

## 2018-04-05 DIAGNOSIS — Z510 Encounter for antineoplastic radiation therapy: Secondary | ICD-10-CM | POA: Diagnosis not present

## 2018-04-08 ENCOUNTER — Ambulatory Visit
Admission: RE | Admit: 2018-04-08 | Discharge: 2018-04-08 | Disposition: A | Discharge status: Home / Self Care | Payer: Medicare Other | Source: Ambulatory Visit | Attending: Radiation Oncology | Admitting: Radiation Oncology

## 2018-04-08 DIAGNOSIS — Z510 Encounter for antineoplastic radiation therapy: Secondary | ICD-10-CM | POA: Diagnosis not present

## 2018-04-09 ENCOUNTER — Ambulatory Visit
Admission: RE | Admit: 2018-04-09 | Discharge: 2018-04-09 | Disposition: A | Discharge status: Home / Self Care | Payer: Medicare Other | Source: Ambulatory Visit | Attending: Radiation Oncology | Admitting: Radiation Oncology

## 2018-04-09 DIAGNOSIS — Z510 Encounter for antineoplastic radiation therapy: Secondary | ICD-10-CM | POA: Diagnosis not present

## 2018-04-10 ENCOUNTER — Ambulatory Visit: Payer: Medicare Other

## 2018-04-11 ENCOUNTER — Ambulatory Visit
Admission: RE | Admit: 2018-04-11 | Discharge: 2018-04-11 | Disposition: A | Discharge status: Home / Self Care | Payer: Medicare Other | Source: Ambulatory Visit | Attending: Radiation Oncology | Admitting: Radiation Oncology

## 2018-04-11 DIAGNOSIS — Z510 Encounter for antineoplastic radiation therapy: Secondary | ICD-10-CM | POA: Diagnosis not present

## 2018-04-12 ENCOUNTER — Ambulatory Visit: Payer: Medicare Other

## 2018-04-12 ENCOUNTER — Ambulatory Visit
Admission: RE | Admit: 2018-04-12 | Discharge: 2018-04-12 | Disposition: A | Discharge status: Home / Self Care | Payer: Medicare Other | Source: Ambulatory Visit | Attending: Radiation Oncology | Admitting: Radiation Oncology

## 2018-04-12 DIAGNOSIS — Z510 Encounter for antineoplastic radiation therapy: Secondary | ICD-10-CM | POA: Diagnosis not present

## 2018-04-15 ENCOUNTER — Ambulatory Visit: Payer: Medicare Other

## 2018-04-22 ENCOUNTER — Ambulatory Visit: Payer: Medicare Other | Admitting: Family Medicine

## 2018-04-25 ENCOUNTER — Telehealth: Payer: Self-pay | Admitting: Hospice and Palliative Medicine

## 2018-04-25 NOTE — Telephone Encounter (Signed)
I called and spoke with patient's daughter. Patient is no longer receiving radiation. We had previously talked about initiation of hospice care once radiation was complete. Daughter says that patient continues to decline. Getting him out of the home to clinic appointments is becoming more difficult. He has also started having blank periods where he stares off into space. We talked about a work up for that or the alternative of just focusing on his comfort. Daughter says she would be interested in starting hospice. Will send order. Dr. Tasia Catchings updated.

## 2018-04-29 ENCOUNTER — Encounter: Payer: Medicare Other | Attending: Physician Assistant | Admitting: Physician Assistant

## 2018-04-29 DIAGNOSIS — G4730 Sleep apnea, unspecified: Secondary | ICD-10-CM | POA: Diagnosis not present

## 2018-04-29 DIAGNOSIS — E11622 Type 2 diabetes mellitus with other skin ulcer: Secondary | ICD-10-CM | POA: Diagnosis not present

## 2018-04-29 DIAGNOSIS — L89890 Pressure ulcer of other site, unstageable: Secondary | ICD-10-CM | POA: Diagnosis not present

## 2018-04-29 DIAGNOSIS — Z87891 Personal history of nicotine dependence: Secondary | ICD-10-CM | POA: Insufficient documentation

## 2018-04-29 DIAGNOSIS — Z85828 Personal history of other malignant neoplasm of skin: Secondary | ICD-10-CM | POA: Insufficient documentation

## 2018-04-29 DIAGNOSIS — I4891 Unspecified atrial fibrillation: Secondary | ICD-10-CM | POA: Diagnosis not present

## 2018-04-29 DIAGNOSIS — I509 Heart failure, unspecified: Secondary | ICD-10-CM | POA: Insufficient documentation

## 2018-04-29 DIAGNOSIS — I110 Hypertensive heart disease with heart failure: Secondary | ICD-10-CM | POA: Diagnosis not present

## 2018-04-29 DIAGNOSIS — J449 Chronic obstructive pulmonary disease, unspecified: Secondary | ICD-10-CM | POA: Insufficient documentation

## 2018-04-29 DIAGNOSIS — L98492 Non-pressure chronic ulcer of skin of other sites with fat layer exposed: Secondary | ICD-10-CM | POA: Diagnosis present

## 2018-04-30 ENCOUNTER — Encounter: Payer: Medicare Other | Admitting: Hospice and Palliative Medicine

## 2018-04-30 ENCOUNTER — Telehealth: Payer: Self-pay | Admitting: *Deleted

## 2018-04-30 NOTE — Telephone Encounter (Signed)
FYI

## 2018-04-30 NOTE — Telephone Encounter (Signed)
Hospice called to report that patient refused hospice services at this time

## 2018-05-02 ENCOUNTER — Other Ambulatory Visit: Payer: Self-pay | Admitting: Internal Medicine

## 2018-05-02 DIAGNOSIS — C7951 Secondary malignant neoplasm of bone: Secondary | ICD-10-CM

## 2018-05-03 NOTE — Progress Notes (Signed)
IZIAH, Glenn Reeves ([id number]) Visit Report for 04/29/2018 Chief Complaint Document Details Patient Name: Glenn Reeves, Glenn A. Date of Service: 04/29/2018 11:00 AM Medical Record Number: [id number] Patient Account Number: [id number] Date of Birth/Sex: 03-22-1931 (85 y.o. M) Treating RN: Harold Barban Primary Care Provider: Ramonita Lab Other Clinician: Referring Provider: Ramonita Lab Treating Provider/Extender: Melburn Hake, HOYT Weeks in Treatment: 14 Information Obtained from: Patient Chief Complaint Left temple malignant ulcer Electronic Signature(s) Signed: 04/30/2018 7:11:38 AM By: Worthy Keeler PA-C Entered By: Worthy Keeler on 04/29/2018 11:21:44 Proffit, Burnard A. ([id number]) -------------------------------------------------------------------------------- HPI Details Patient Name: Glenn Bras A. Date of Service: 04/29/2018 11:00 AM Medical Record Number: [id number] Patient Account Number: [id number] Date of Birth/Sex: Sep 08, 1930 (85 y.o. 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 The patient actually went to have a Mohs surgery what sounds to be somewhere between 2-3 years ago. Nonetheless during the most surgery the surgeon/pathologist stated that he could not really clear the margins and felt that a plastic surgery referral may be necessary. Subsequently the patient was actually eventually referred to [facility] for plastic surgery to remove the  carcinoma and at that point there was no bone involvement according to the patient's daughter. Nonetheless due to several extenuating circumstances initially the surgery was postponed about a surgeon due to some conflict secondarily to that it was again postponed due to the patient being ill during the time the surgery was put supposed to be next performed. Nonetheless in the end the patient opted to forgo any surgery whatsoever following and did not really have reevaluation until 12/26/17 when he did have bone involvement. The CT scan reads that the patient has a large left frontal scalp mass measuring up to 8.8 x 3.5 cm with extensive erosion of the left frontal calvarium, left frontal sinus, left maxillary process, in the superior and lateral walls of the left orbit. This appears to be a invasive skin carcinoma. Unfortunately at this time the decision has been made to proceed with radiation therapy as a palliative process not curative currently. Basically the patient comes in today by way of his daughter who is his primary caretaker at this point in order for [place] to recommend options as far as dressings are concerned to help manage this in a palliative manner patient's hemoglobin A1c when last taken was 6.6 and this was on 12/21/17. Currently the patient does have pain at the site of the cancer and there is a definite open wound at the site as well which does drain although his daughter states it does not seem to drain to significantly. There is no evidence of infection at this time. 02/12/18 on evaluation today patient appears to be doing overall about the same in regard to the left temple ulcer. This again is a cancerous/malignant wound. Nonetheless it is a little bit larger he has been undergoing radiation therapy which is likely part of the reason for this. Nonetheless it does not look bad. A silver contact layer has been utilized at this point by home health which  I think is definitely  appropriate it does look like it's doing a good job. As far as the overall appearance of the wound otherwise I'm seeing no evidence of hyper granular tissue apparently Hydrofera Blue Dressing mentioned as a possibility I'm not even sure that would be necessary. I do think we need to try to avoid tape to the area as much is possible due to the fact that it seems to be causing some skin tearing. He does have a new wound on the posterior portion of his neck which is actually due to a pressure area where I believe his BiPAP machine is actually pushing on back of his neck too tightly. This is painful today. 03/05/18 on evaluation today patient's wound on the left temporal region actually show signs of looking a little better today compared to my previous evaluation. He actually is just completing a two week break in regard to his chemotherapy. He restarts chemotherapy tomorrow. Overall I feel like things are fairly stable and in fact look a little bit better. 04/29/17 on evaluation today patient's ulcer which is again cancerous in nature appears to be doing fairly well. He has been undergoing chemotherapy and radiation which seems to have been of benefit for him. The area is very dry compared to what it has been in the past and really no dressings in particular have been applied mainly there just trying to keep this drive. The patient's daughter states that she's really at the point that she would prefer no longer continuing wound care services since there's really not much that we're doing at this point I feel like this is actually an appropriate way to go. Nonetheless I do think the wound appears to be much better than what it was in the past and again the main reason for continued wound care prior was keeping dressing supplies ordered as well as home health continuing for him. Electronic Signature(s) Signed: 04/30/2018 7:11:38 AM By: Worthy Keeler PA-C Entered By: Worthy Keeler on 04/30/2018  05:57:35 Glenn Reeves ([id number]) Glenn Reeves ([id number]) -------------------------------------------------------------------------------- Physical Exam Details Patient Name: Glenn Bras A. Date of Service: 04/29/2018 11:00 AM Medical Record Number: [id number] Patient Account Number: [id number] Date of Birth/Sex: [birth year] (85 y.o. M) Treating RN: Harold Barban Primary Care Provider: Ramonita Lab Other Clinician: Referring Provider: Ramonita Lab Treating Provider/Extender: Melburn Hake, HOYT Weeks in Treatment: 14 Constitutional Chronically ill appearing but in no apparent acute distress. Respiratory normal breathing without difficulty. Psychiatric this patient is able to make decisions and demonstrates good insight into disease process. Alert and Oriented x 3. pleasant and cooperative. Notes At this point patient's ulcer region actually appears to be mainly just a dry eschar which is doing very well in my pinion it appears to be stable. I do not see any evidence that this is worsening and it does appear to be significantly better than when I first saw him. This is excellent news. Nonetheless I think he is okay to discharge from wound care services pending anything worsening which his daughter can always let us know at that point. Electronic Signature(s) Signed: 04/30/2018 7:11:38 AM By: Worthy Keeler PA-C Entered By: Worthy Keeler on 04/30/2018 05:58:19 Glenn Reeves ([id number]) -------------------------------------------------------------------------------- Physician Orders Details Patient Name: Glenn Bras A. Date of Service: 04/29/2018 11:00 AM Medical Record Number: [id number] Patient Account Number: [id number] Date of Birth/Sex: 1930/05/25 (85 y.o. M) Treating RN: Harold Barban Primary Care Provider: Ramonita Lab Other Clinician: Referring Provider: Ramonita Lab Treating Provider/Extender:  STONE III, HOYT Weeks in Treatment: 14 Verbal / Phone  Orders: No Diagnosis Coding ICD-10 Coding Code Description C44.92 Squamous cell carcinoma of skin, unspecified L98.492 Non-pressure chronic ulcer of skin of other sites with fat layer exposed E11.622 Type 2 diabetes mellitus with other skin ulcer L89.890 Pressure ulcer of other site, unstageable I10 Essential (primary) hypertension Discharge From [facility] o Discharge from [place] Signature(s) Signed: 04/30/2018 7:11:38 AM By: Worthy Keeler PA-C Signed: 05/02/2018 2:41:13 PM By: Harold Barban Entered By: Harold Barban on 04/29/2018 12:00:38 Glenn Reeves ([id number]) -------------------------------------------------------------------------------- Problem List Details Patient Name: Glenn Bras A. Date of Service: 04/29/2018 11:00 AM Medical Record Number: [id number] Patient Account Number: [id number] Date of Birth/Sex: 1930/08/24 (85 y.o. M) Treating RN: Harold Barban Primary Care Provider: Ramonita Lab Other Clinician: Referring Provider: Ramonita Lab Treating Provider/Extender: Melburn Hake, HOYT Weeks in Treatment: 14 Active Problems ICD-10 Evaluated Encounter Code Description Active Date Today Diagnosis C44.92 Squamous cell carcinoma of skin, unspecified 01/15/2018 No Yes L98.492 Non-pressure chronic ulcer of skin of other sites with fat layer 01/15/2018 No Yes exposed E11.622 Type 2 diabetes mellitus with other skin ulcer 01/15/2018 No Yes L89.890 Pressure ulcer of other site, unstageable 02/12/2018 No Yes I10 Essential (primary) hypertension 01/15/2018 No Yes Inactive Problems Resolved Problems Electronic Signature(s) Signed: 04/30/2018 7:11:38 AM By: Worthy Keeler PA-C Entered By: Worthy Keeler on 04/29/2018 11:21:39 Kulkarni, Gerasimos A. ([id number]) -------------------------------------------------------------------------------- Progress Note Details Patient Name: Glenn Bras A. Date of Service: 04/29/2018 11:00 AM Medical Record  Number: [id number] Patient Account Number: [id number] Date of Birth/Sex: [birth year] (85 y.o. 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 The patient actually went to have a Mohs surgery what sounds to be somewhere between 2-3 years ago. Nonetheless during the most surgery the surgeon/pathologist stated that he could not really clear the margins and felt that a plastic surgery referral may be necessary. Subsequently the patient was actually eventually referred to [facility] for plastic surgery to remove the carcinoma and at that point there was no bone involvement according to the patient's daughter. Nonetheless due to several extenuating circumstances initially the surgery was postponed about a surgeon due to some conflict secondarily to that it was again postponed due to the patient being ill during the time the surgery was put supposed to be next performed. Nonetheless in the end the patient opted to forgo any surgery whatsoever following and did not really have reevaluation until 12/26/17 when he did have bone involvement. The CT scan reads that the patient has a large left frontal scalp mass measuring up to 8.8 x 3.5 cm with extensive erosion of the left frontal calvarium, left frontal sinus, left maxillary process, in the  superior and lateral walls of the left orbit. This appears to be a invasive skin carcinoma. Unfortunately at this time the decision has been made to proceed with radiation therapy as a palliative process  not curative currently. Basically the patient comes in today by way of his daughter who is his primary caretaker at this point in order for [place] to recommend options as far as dressings are concerned to help manage this in a palliative manner patient's hemoglobin A1c when last taken was 6.6 and this was on 12/21/17. Currently the patient does have pain at the site of the cancer and there is a definite open wound at the site as well which does drain although his daughter states it does not seem to drain to significantly. There is no evidence of infection at this time. 02/12/18 on evaluation today patient appears to be doing overall about the same in regard to the left temple ulcer. This again is a cancerous/malignant wound. Nonetheless it is a little bit larger he has been undergoing radiation therapy which is likely part of the reason for this. Nonetheless it does not look bad. A silver contact layer has been utilized at this point by home health which I think is definitely appropriate it does look like it's doing a good job. As far as the overall appearance of the wound otherwise I'm seeing no evidence of hyper granular tissue apparently Hydrofera Blue Dressing mentioned as a possibility I'm not even sure that would be necessary. I do think we need to try to avoid tape to the area as much is possible due to the fact that it seems to be causing some skin tearing. He does have a new wound on the posterior portion of his neck which is actually due to a pressure area where I believe his BiPAP machine is actually pushing on back of his neck too tightly. This is painful today. 03/05/18 on evaluation today patient's wound on the left temporal region actually show signs of looking a little better  today compared to my previous evaluation. He actually is just completing a two week break in regard to his chemotherapy. He restarts chemotherapy tomorrow. Overall I feel like things are fairly stable and in fact look a little bit better. 04/29/17 on evaluation today patient's ulcer which is again cancerous in nature appears to be doing fairly well. He has been undergoing chemotherapy and radiation which seems to have been of benefit for him. The area is very dry compared to what it has been in the past and really no dressings in particular have been applied mainly there just trying to keep this drive. The patient's daughter states that she's really at the point that she would prefer no longer continuing wound care services since there's really not much that we're doing at this point I feel like this is actually an appropriate way to go. Nonetheless I do think Glenn Reeves, Glenn A. ([id number]) the wound appears to be much better than what it was in the past and again the main reason for continued wound care prior was keeping dressing supplies ordered as well as home health continuing for him. Patient History Information obtained from Patient. Family History Heart Disease - Father,Siblings, Stroke - Siblings, No family history of Cancer, Diabetes, Hypertension, Kidney Disease, Lung Disease, Seizures, Thyroid Problems, Tuberculosis. Social History Former smoker - cigar, Marital Status - Widowed, Alcohol Use - Never, Drug Use - No History, Caffeine Use - Rarely. Medical And Surgical History Notes Ear/Nose/Mouth/Throat left ear squamous cell removed Cardiovascular Dr Ramonita Lab has pulmonary fibrosis Review of Systems (ROS) Constitutional Symptoms (General Health) Denies complaints or symptoms of Fever, Chills. Respiratory The patient has no complaints or symptoms. Cardiovascular The patient  has no complaints or symptoms. Psychiatric The patient has no complaints or  symptoms. Objective Constitutional Chronically ill appearing but in no apparent acute distress. Vitals Time Taken: 11:02 AM, Height: 69 in, Weight: 166 lbs, BMI: 24.5, Temperature: 97.7 F, Pulse: 78 bpm, Respiratory Rate: 22 breaths/min, Blood Pressure: 125/54 mmHg. Respiratory normal breathing without difficulty. Psychiatric this patient is able to make decisions and demonstrates good insight into disease process. Alert and Oriented x 3. pleasant and cooperative. General Notes: At this point patient's ulcer region actually appears to be mainly just a dry eschar which is doing very well in my pinion it appears to be stable. I do not see any evidence that this is worsening and it does appear to be significantly better than when I first saw him. This is excellent news. Nonetheless I think he is okay to discharge from wound care services Glenn Reeves, Glenn A. ([id number]) pending anything worsening which his daughter can always let us know at that point. Integumentary (Hair, Skin) Wound #1 status is Open. Original cause of wound was Other Lesion. The wound is located on the Left Head - Temporal. The wound measures 2.5cm length x 4.6cm width x 0.1cm depth; 9.032cm^2 area and 0.903cm^3 volume. There is no tunneling or undermining noted. There is a none present amount of drainage noted. There is no granulation within the wound bed. There is a large (67-100%) amount of necrotic tissue within the wound bed including Eschar. The periwound skin appearance did not exhibit: Callus, Crepitus, Excoriation, Induration, Rash, Scarring, Dry/Scaly, Maceration, Atrophie Blanche, Cyanosis, Ecchymosis, Hemosiderin Staining, Mottled, Pallor, Rubor, Erythema. General Notes: daughter of patient declined lidocaine or treatment at this time. Wound #2 status is Healed - Epithelialized. Original cause of wound was Pressure Injury. The wound is located on the [place]. The wound measures 0cm length x 0cm  width x 0cm depth; 0cm^2 area and 0cm^3 volume. Assessment Active Problems ICD-10 Squamous cell carcinoma of skin, unspecified Non-pressure chronic ulcer of skin of other sites with fat layer exposed Type 2 diabetes mellitus with other skin ulcer Pressure ulcer of other site, unstageable Essential (primary) hypertension Plan Discharge From [facility] Services: Discharge from [facility] Again we will discontinue wound care services at this point the patient's daughter is in agreement with plan if anything changes she knows to contact her office and let us know otherwise she will continue to follow up with the cancer center as they have been. Electronic Signature(s) Signed: 04/30/2018 7:11:38 AM By: Worthy Keeler PA-C Entered By: Worthy Keeler on 04/30/2018 05:58:39 Glenn Reeves ([id number]) -------------------------------------------------------------------------------- ROS/PFSH Details Patient Name: Glenn Bras A. Date of Service: 04/29/2018 11:00 AM Medical Record Number: [id number] Patient Account Number: [id number] Date of Birth/Sex: Apr 09, 1931 (85 y.o. M) Treating RN: Harold Barban Primary Care Provider: Ramonita Lab Other Clinician: Referring Provider: Ramonita Lab Treating Provider/Extender: Melburn Hake, HOYT Weeks in Treatment: 14 Information Obtained From Patient Wound History Do you currently have one or more open woundso Yes How many open wounds do you currently haveo 1 Approximately how long have you had your woundso 02/23/2017 How have you been treating your wound(s) until nowo peroxide/ saline bandage Has your wound(s) ever healed and then re-openedo Yes Have you had any lab work done in the past montho No Have you tested positive for an antibiotic resistant organism (MRSA, VRE)o No Have you tested positive for osteomyelitis (bone infection)o No Have you had any tests for circulation on your legso No Constitutional Symptoms (General Health) Complaints  and Symptoms: Negative for: Fever; Chills Eyes Medical History: Positive for: Cataracts - removed Negative for: Glaucoma; Optic Neuritis Ear/Nose/Mouth/Throat Medical History: Negative for: Chronic sinus problems/congestion; Middle ear problems Past Medical History Notes: left ear squamous cell removed Hematologic/Lymphatic Medical History: Negative for: Anemia; Hemophilia; Human Immunodeficiency Virus; Lymphedema; Sickle Cell Disease Respiratory Complaints and Symptoms: No Complaints or Symptoms Medical History: Positive for: Chronic Obstructive Pulmonary Disease (COPD); Sleep Apnea Negative for: Asthma; Pneumothorax; Tuberculosis Cardiovascular Complaints and Symptoms: No Complaints or Symptoms Glenn Reeves, Glenn A. ([id number]) Medical History: Positive for: Arrhythmia - afib; Congestive Heart Failure; Hypertension Negative for: Coronary Artery Disease; Deep Vein Thrombosis; Hypotension; Myocardial Infarction; Peripheral Arterial Disease; Peripheral Venous Disease; Phlebitis; Vasculitis Past Medical History Notes: Dr Ramonita Lab has pulmonary fibrosis Gastrointestinal Medical History: Negative for: Cirrhosis ; Colitis; Crohnos; Hepatitis A; Hepatitis B; Hepatitis C Endocrine Medical History: Positive for: Type II Diabetes Negative for: Type I Diabetes Time with diabetes: 20 years Treated with: Insulin Blood sugar tested every day: Yes Tested : tid Blood sugar testing results: Breakfast: 90 Genitourinary Medical History: Negative for: End Stage Renal Disease Immunological Medical History: Negative for: Lupus Erythematosus; Raynaudos; Scleroderma Integumentary (Skin) Medical History: Negative for: History of Burn; History of pressure wounds Musculoskeletal Medical History: Negative for: Gout; Rheumatoid Arthritis; Osteoarthritis; Osteomyelitis Neurologic Medical History: Negative for: Dementia; Neuropathy; Quadriplegia; Paraplegia; Seizure  Disorder Oncologic Medical History: Negative for: Received Chemotherapy; Received Radiation Psychiatric Glenn Reeves, Glenn A. ([id number]) Complaints and Symptoms: No Complaints or Symptoms HBO Extended History Items Eyes: Cataracts Immunizations Pneumococcal Vaccine: Received Pneumococcal Vaccination: No Implantable Devices Family and Social History Cancer: No; Diabetes: No; Heart Disease: Yes - Father,Siblings; Hypertension: No; Kidney Disease: No; Lung Disease: No; Seizures: No; Stroke: Yes - Siblings; Thyroid Problems: No; Tuberculosis: No; Former smoker - cigar; Marital Status - Widowed; Alcohol Use: Never; Drug Use: No History; Caffeine Use: Rarely; Financial Concerns: No; Food, Clothing or Shelter Needs: No; Support System Lacking: No; Transportation Concerns: No; Advanced Directives: Yes (Not Provided); Patient does not want information on Advanced Directives; Do not resuscitate: No; Living Will: Yes (Not Provided); Medical Power of Attorney: No Physician Affirmation I have reviewed and agree with the above information. Electronic Signature(s) Signed: 04/30/2018 7:11:38 AM By: Worthy Keeler PA-C Signed: 05/02/2018 2:41:13 PM By: Harold Barban Entered By: Worthy Keeler on 04/30/2018 05:57:52 Glenn Reeves, Glenn A. ([id number]) -------------------------------------------------------------------------------- SuperBill Details Patient Name: Glenn Bras A. Date of Service: 04/29/2018 Medical Record Number: [id number] Patient Account Number: [id number] Date of Birth/Sex: [birth year] (85 y.o. M) Treating RN: Harold Barban Primary Care Provider: Ramonita Lab Other Clinician: Referring Provider: Ramonita Lab Treating Provider/Extender: Melburn Hake, HOYT Weeks in Treatment: 14 Diagnosis Coding ICD-10 Codes Code Description C44.92 Squamous cell carcinoma of skin, unspecified L98.492 Non-pressure chronic ulcer of skin of other sites with fat layer exposed E11.622 Type 2 diabetes  mellitus with other skin ulcer L89.890 Pressure ulcer of other site, unstageable I10 Essential (primary) hypertension Facility Procedures CPT4 Code: [id number] Description: [phone] - WOUND CARE VISIT-LEV 2 EST PT Modifier: Quantity: 1 Physician Procedures CPT4 Code: [id number] Description: 73428 - WC PHYS LEVEL 2 - EST PT ICD-10 Diagnosis Description C44.92 Squamous cell carcinoma of skin, unspecified L98.492 Non-pressure chronic ulcer of skin of other sites with fat l E11.622 Type 2 diabetes mellitus with other skin ulcer  L89.890 Pressure ulcer of other site, unstageable Modifier: ayer exposed Quantity: 1 Electronic Signature(s) Signed: 04/30/2018 7:11:38 AM By: Worthy Keeler PA-C Entered By: Worthy Keeler on 04/30/2018 05:58:57

## 2018-05-03 NOTE — Progress Notes (Signed)
Glenn, Reeves ([id number]) Visit Report for 04/29/2018 Arrival Information Details Patient Name: Glenn Reeves, Glenn A. Date of Service: 04/29/2018 11:00 AM Medical Record Number: [id number] Patient Account Number: [id number] Date of Birth/Sex: [birth year] (85 y.o. M) Treating RN: Harold Barban Primary Care Erskin Zinda: Ramonita Lab Other Clinician: Referring Chanelle Hodsdon: Ramonita Lab Treating Yuki Purves/Extender: Melburn Hake, HOYT Weeks in Treatment: 14 Visit Information History Since Last Visit Added or deleted any medications: No Patient Arrived: Wheel Chair Any new allergies or adverse reactions: No Arrival Time: 11:01 Had a fall or experienced change in No Accompanied By: daughter activities of daily living that may affect Transfer Assistance: None risk of falls: Patient Identification Verified: Yes Signs or symptoms of abuse/neglect since last visito No Secondary Verification Process Completed: Yes Hospitalized since last visit: No Implantable device outside of the clinic excluding No cellular tissue based products placed in the center since last visit: Has Dressing in Place as Prescribed: Yes Pain Present Now: No Electronic Signature(s) Signed: 04/29/2018 3:59:13 PM By: Lorine Bears RCP, RRT, CHT Entered By: Lorine Bears on 04/29/2018 11:02:08 Glenn Bras A. ([id number]) -------------------------------------------------------------------------------- Clinic Level of Care Assessment Details Patient Name: Glenn Bras A. Date of Service: 04/29/2018 11:00 AM Medical Record Number: [id number] Patient Account Number: [id number] Date of Birth/Sex: 10-15-1930 (85 y.o. M) Treating RN: Harold Barban Primary Care Madesyn Ast: Ramonita Lab Other Clinician: Referring Leeona Mccardle: Ramonita Lab Treating Darina Hartwell/Extender: Melburn Hake, HOYT Weeks in Treatment: 14 Clinic Level of Care Assessment Items TOOL 4 Quantity Score []  - Use when only an EandM is performed  on FOLLOW-UP visit 0 ASSESSMENTS - Nursing Assessment / Reassessment X - Reassessment of Co-morbidities (includes updates in patient status) 1 10 X- 1 5 Reassessment of Adherence to Treatment Plan ASSESSMENTS - Wound and Skin Assessment / Reassessment X - Simple Wound Assessment / Reassessment - one wound 1 5 []  - 0 Complex Wound Assessment / Reassessment - multiple wounds []  - 0 Dermatologic / Skin Assessment (not related to wound area) ASSESSMENTS - Focused Assessment []  - Circumferential Edema Measurements - multi extremities 0 []  - 0 Nutritional Assessment / Counseling / Intervention []  - 0 Lower Extremity Assessment (monofilament, tuning fork, pulses) []  - 0 Peripheral Arterial Disease Assessment (using hand held doppler) ASSESSMENTS - Ostomy and/or Continence Assessment and Care []  - Incontinence Assessment and Management 0 []  - 0 Ostomy Care Assessment and Management (repouching, etc.) PROCESS - Coordination of Care X - Simple Patient / Family Education for ongoing care 1 15 []  - 0 Complex (extensive) Patient / Family Education for ongoing care X- 1 10 Staff obtains Programmer, systems, Records, Test Results / Process Orders []  - 0 Staff telephones HHA, Nursing Homes / Clarify orders / etc []  - 0 Routine Transfer to another Facility (non-emergent condition) []  - 0 Routine Hospital Admission (non-emergent condition) []  - 0 New Admissions / Biomedical engineer / Ordering NPWT, Apligraf, etc. []  - 0 Emergency Hospital Admission (emergent condition) X- 1 10 Simple Discharge Coordination [place], Bernhardt A. ([id number]) []  - 0 Complex (extensive) Discharge Coordination PROCESS - Special Needs []  - Pediatric / Minor Patient Management 0 []  - 0 Isolation Patient Management []  - 0 Hearing / Language / Visual special needs []  - 0 Assessment of Community assistance (transportation, D/C planning, etc.) []  - 0 Additional assistance / Altered mentation []  - 0 Support  Surface(s) Assessment (bed, cushion, seat, etc.) INTERVENTIONS - Wound Cleansing / Measurement X - Simple Wound Cleansing - one wound 1 5 []  - 0 Complex Wound Cleansing - multiple  wounds X- 1 5 Wound Imaging (photographs - any number of wounds) []  - 0 Wound Tracing (instead of photographs) X- 1 5 Simple Wound Measurement - one wound []  - 0 Complex Wound Measurement - multiple wounds INTERVENTIONS - Wound Dressings []  - Small Wound Dressing one or multiple wounds 0 []  - 0 Medium Wound Dressing one or multiple wounds []  - 0 Large Wound Dressing one or multiple wounds []  - 0 Application of Medications - topical []  - 0 Application of Medications - injection INTERVENTIONS - Miscellaneous []  - External ear exam 0 []  - 0 Specimen Collection (cultures, biopsies, blood, body fluids, etc.) []  - 0 Specimen(s) / Culture(s) sent or taken to Lab for analysis []  - 0 Patient Transfer (multiple staff / Civil Service fast streamer / Similar devices) []  - 0 Simple Staple / Suture removal (25 or less) []  - 0 Complex Staple / Suture removal (26 or more) []  - 0 Hypo / Hyperglycemic Management (close monitor of Blood Glucose) []  - 0 Ankle / Brachial Index (ABI) - do not check if billed separately X- 1 5 Vital Signs Dattilo, Lavarius A. ([id number]) Has the patient been seen at the hospital within the last three years: Yes Total Score: 75 Level Of Care: New/Established - Level 2 Electronic Signature(s) Signed: 05/02/2018 2:41:13 PM By: Harold Barban Entered By: Harold Barban on 04/29/2018 11:59:03 Bero, Dartanyon A. ([id number]) -------------------------------------------------------------------------------- Complex / Palliative Patient Assessment Details Patient Name: Glenn Bras A. Date of Service: 04/29/2018 11:00 AM Medical Record Number: [id number] Patient Account Number: [id number] Date of Birth/Sex: 17-Oct-1930 (85 y.o. M) Treating RN: Harold Barban Primary Care Cherylin Waguespack: Ramonita Lab Other  Clinician: Referring Jerrelle Michelsen: Ramonita Lab Treating Leng Montesdeoca/Extender: Melburn Hake, HOYT Weeks in Treatment: 14 Palliative Management Criteria Complex Wound Management Criteria Patient has remarkable or complex co-morbidities requiring medications or treatments that extend wound healing times. Examples: o Diabetes mellitus with chronic renal failure or end stage renal disease requiring dialysis o Advanced or poorly controlled rheumatoid arthritis o Diabetes mellitus and end stage chronic obstructive pulmonary disease o Active cancer with current chemo- or radiation therapy Active cancer with radiation treatment at the wound site The patient, the patient's family or care Treina Arscott(s), or primary care physician has requested supportive care rather than advanced wound treatment. (Must be documented in physician progress notes.) Care Approach Wound Care Plan: Complex Wound Management Electronic Signature(s) Signed: 04/29/2018 2:22:59 PM By: Worthy Keeler PA-C Entered By: Worthy Keeler on 04/29/2018 14:22:57 Apachito, Jef A. ([id number]) -------------------------------------------------------------------------------- Lower Extremity Assessment Details Patient Name: Edick, Sherman A. Date of Service: 04/29/2018 11:00 AM Medical Record Number: [id number] Patient Account Number: [id number] Date of Birth/Sex: Apr 02, 1931 (85 y.o. M) Treating RN: Secundino Ginger Primary Care Baya Lentz: Ramonita Lab Other Clinician: Referring Trigo Winterbottom: Ramonita Lab Treating Grantley Savage/Extender: Melburn Hake, HOYT Weeks in Treatment: 14 Electronic Signature(s) Signed: 04/29/2018 4:23:06 PM By: Secundino Ginger Entered By: Secundino Ginger on 04/29/2018 11:23:55 Heckert, Quantrell A. ([id number]) -------------------------------------------------------------------------------- Multi Wound Chart Details Patient Name: Glenn Bras A. Date of Service: 04/29/2018 11:00 AM Medical Record Number: [id number] Patient Account Number:  [id number] Date of Birth/Sex: 10/25/1930 (85 y.o. M) Treating RN: Harold Barban Primary Care Claxton Levitz: Ramonita Lab Other Clinician: Referring Cielo Arias: Ramonita Lab Treating Emiyah Spraggins/Extender: Melburn Hake, HOYT Weeks in Treatment: 14 Vital Signs Height(in): 69 Pulse(bpm): 78 Weight(lbs): 166 Blood Pressure(mmHg): 125/54 Body Mass Index(BMI): 25 Temperature(F): 97.7 Respiratory Rate 22 (breaths/min): Photos: [2:No Photos] [N/A:N/A] Wound Location: Left Head - Temporal Midline, Posterior Neck N/A Wounding Event: Other Lesion Pressure Injury N/A Primary Etiology:  Malignant Wound Pressure Ulcer N/A Comorbid History: Cataracts, Chronic Obstructive N/A N/A Pulmonary Disease (COPD), Sleep Apnea, Arrhythmia, Congestive Heart Failure, Hypertension, Type II Diabetes Date Acquired: 02/23/2017 02/05/2018 N/A Weeks of Treatment: 14 10 N/A Wound Status: Open Healed - Epithelialized N/A Measurements L x W x D 2.5x4.6x0.1 0x0x0 N/A (cm) Area (cm) : 9.032 0 N/A Volume (cm) : 0.903 0 N/A % Reduction in Area: 43.20% 100.00% N/A % Reduction in Volume: 71.60% 100.00% N/A Classification: Full Thickness Without Unstageable/Unclassified N/A Exposed Support Structures Exudate Amount: None Present N/A N/A Granulation Amount: None Present (0%) N/A N/A Necrotic Amount: Large (67-100%) N/A N/A Necrotic Tissue: Eschar N/A N/A Exposed Structures: Fascia: No N/A N/A Fat Layer (Subcutaneous Tissue) Exposed: No Tendon: No Muscle: No Guarnieri, Nealy A. ([id number]) Joint: No Bone: No Periwound Skin Texture: Excoriation: No No Abnormalities Noted N/A Induration: No Callus: No Crepitus: No Rash: No Scarring: No Periwound Skin Moisture: Maceration: No No Abnormalities Noted N/A Dry/Scaly: No Periwound Skin Color: Atrophie Blanche: No No Abnormalities Noted N/A Cyanosis: No Ecchymosis: No Erythema: No Hemosiderin Staining: No Mottled: No Pallor: No Rubor: No Tenderness on Palpation:  No No N/A Wound Preparation: Ulcer Cleansing: N/A N/A Rinsed/Irrigated with Saline Assessment Notes: daughter of patient declined N/A N/A lidocaine or treatment at this time. Treatment Notes Electronic Signature(s) Signed: 05/02/2018 2:41:13 PM By: Harold Barban Entered By: Harold Barban on 04/29/2018 11:58:09 Dorthey Sawyer ([id number]) -------------------------------------------------------------------------------- [place] Details Patient Name: Glenn Bras A. Date of Service: 04/29/2018 11:00 AM Medical Record Number: [id number] Patient Account Number: [id number] Date of Birth/Sex: Oct 21, 1930 (85 y.o. M) Treating RN: Harold Barban Primary Care Trulee Hamstra: Ramonita Lab Other Clinician: Referring [place] Espina: Ramonita Lab Treating Luciano Cinquemani/Extender: Melburn Hake, HOYT Weeks in Treatment: 14 Active Inactive Electronic Signature(s) Signed: 05/02/2018 2:41:13 PM By: Harold Barban Entered By: Harold Barban on 04/29/2018 12:01:10 Glenn Bras A. ([id number]) -------------------------------------------------------------------------------- Pain Assessment Details Patient Name: Glenn Bras A. Date of Service: 04/29/2018 11:00 AM Medical Record Number: [id number] Patient Account Number: [id number] Date of Birth/Sex: 28-May-1930 (85 y.o. M) Treating RN: Harold Barban Primary Care Lennyx Verdell: Ramonita Lab Other Clinician: Referring Miah Boye: Ramonita Lab Treating Gracelynn Bircher/Extender: Melburn Hake, HOYT Weeks in Treatment: 14 Active Problems Location of Pain Severity and Description of Pain Patient Has Paino No Site Locations Pain Management and Medication Current Pain Management: Electronic Signature(s) Signed: 04/29/2018 3:59:13 PM By: Lorine Bears RCP, RRT, CHT Signed: 05/02/2018 2:41:13 PM By: Harold Barban Entered By: Lorine Bears on 04/29/2018 11:02:17 Dorthey Sawyer  ([id number]) -------------------------------------------------------------------------------- Patient/Caregiver Education Details Patient Name: Glenn Bras A. Date of Service: 04/29/2018 11:00 AM Medical Record Number: [id number] Patient Account Number: [id number] Date of Birth/Gender: 1930/05/24 (85 y.o. M) Treating RN: Harold Barban Primary Care Physician: Ramonita Lab Other Clinician: Referring Physician: Ramonita Lab Treating Physician/Extender: Sharalyn Ink in Treatment: 14 Education Assessment Education Provided To: Patient Education Topics Provided Electronic Signature(s) Signed: 05/02/2018 2:41:13 PM By: Harold Barban Entered By: Harold Barban on 04/29/2018 11:58:19 Macleod, Heron A. ([id number]) -------------------------------------------------------------------------------- Wound Assessment Details Patient Name: Glenn Bras A. Date of Service: 04/29/2018 11:00 AM Medical Record Number: [id number] Patient Account Number: [id number] Date of Birth/Sex: 1931/04/04 (85 y.o. M) Treating RN: Secundino Ginger Primary Care Keeanna Villafranca: Ramonita Lab Other Clinician: Referring Janelie Goltz: Ramonita Lab Treating Garmon Dehn/Extender: Melburn Hake, HOYT Weeks in Treatment: 14 Wound Status Wound Number: 1 Primary Malignant Wound Etiology: Wound Location: Left Head - Temporal Wound Open Wounding Event: Other Lesion Status: Date Acquired: 02/23/2017 Comorbid Cataracts, Chronic Obstructive Pulmonary Weeks Of Treatment:  14 History: Disease (COPD), Sleep Apnea, Arrhythmia, Clustered Wound: No Congestive Heart Failure, Hypertension, Type II Diabetes Photos Photo Uploaded By: Secundino Ginger on 04/29/2018 11:51:57 Wound Measurements Length: (cm) 2.5 Width: (cm) 4.6 Depth: (cm) 0.1 Area: (cm) 9.032 Volume: (cm) 0.903 % Reduction in Area: 43.2% % Reduction in Volume: 71.6% Tunneling: No Undermining: No Wound Description Full Thickness Without Exposed  Support Classification: Structures Exudate None Present Amount: Wound Bed Granulation Amount: None Present (0%) Exposed Structure Necrotic Amount: Large (67-100%) Fascia Exposed: No Necrotic Quality: Eschar Fat Layer (Subcutaneous Tissue) Exposed: No Tendon Exposed: No Muscle Exposed: No Joint Exposed: No Bone Exposed: No Periwound Skin Texture Stanforth, Alakai A. ([id number]) Texture Color No Abnormalities Noted: No No Abnormalities Noted: No Callus: No Atrophie Blanche: No Crepitus: No Cyanosis: No Excoriation: No Ecchymosis: No Induration: No Erythema: No Rash: No Hemosiderin Staining: No Scarring: No Mottled: No Pallor: No Moisture Rubor: No No Abnormalities Noted: No Dry / Scaly: No Maceration: No Wound Preparation Ulcer Cleansing: Rinsed/Irrigated with Saline Assessment Notes daughter of patient declined lidocaine or treatment at this time. Electronic Signature(s) Signed: 04/29/2018 4:23:06 PM By: Secundino Ginger Entered By: Secundino Ginger on 04/29/2018 11:27:49 Goins, Slate AMarland Kitchen ([id number]) -------------------------------------------------------------------------------- Wound Assessment Details Patient Name: Glenn Bras A. Date of Service: 04/29/2018 11:00 AM Medical Record Number: [id number] Patient Account Number: [id number] Date of Birth/Sex: [birth year] (85 y.o. M) Treating RN: Secundino Ginger Primary Care Edsel Shives: Ramonita Lab Other Clinician: Referring Bintou Lafata: Ramonita Lab Treating Remberto Lienhard/Extender: Melburn Hake, HOYT Weeks in Treatment: 14 Wound Status Wound Number: 2 Primary Etiology: Pressure Ulcer Wound Location: Midline, Posterior Neck Wound Status: Healed - Epithelialized Wounding Event: Pressure Injury Date Acquired: 02/05/2018 Weeks Of Treatment: 10 Clustered Wound: No Wound Measurements Length: (cm) 0 Width: (cm) 0 Depth: (cm) 0 Area: (cm) 0 Volume: (cm) 0 % Reduction in Area: 100% % Reduction in Volume: 100% Wound  Description Classification: Unstageable/Unclassified Periwound Skin Texture Texture Color No Abnormalities Noted: No No Abnormalities Noted: No Moisture No Abnormalities Noted: No Electronic Signature(s) Signed: 04/29/2018 4:23:06 PM By: Secundino Ginger Entered By: Secundino Ginger on 04/29/2018 11:23:47 Formoso, Raden A. ([id number]) -------------------------------------------------------------------------------- Vitals Details Patient Name: Glenn Bras A. Date of Service: 04/29/2018 11:00 AM Medical Record Number: [id number] Patient Account Number: [id number] Date of Birth/Sex: January 30, 1931 (85 y.o. M) Treating RN: Harold Barban Primary Care Marieme Mcmackin: Ramonita Lab Other Clinician: Referring Fadi Menter: Ramonita Lab Treating Raevyn Sokol/Extender: Melburn Hake, HOYT Weeks in Treatment: 14 Vital Signs Time Taken: 11:02 Temperature (F): 97.7 Height (in): 69 Pulse (bpm): 78 Weight (lbs): 166 Respiratory Rate (breaths/min): 22 Body Mass Index (BMI): 24.5 Blood Pressure (mmHg): 125/54 Reference Range: 80 - 120 mg / dl Electronic Signature(s) Signed: 04/29/2018 3:59:13 PM By: Lorine Bears RCP, RRT, CHT Entered By: Lorine Bears on 04/29/2018 11:05:32

## 2018-05-14 ENCOUNTER — Ambulatory Visit
Admission: RE | Admit: 2018-05-14 | Discharge: 2018-05-14 | Disposition: A | Discharge status: Home / Self Care | Payer: Medicare Other | Source: Ambulatory Visit | Attending: Internal Medicine | Admitting: Internal Medicine

## 2018-05-14 DIAGNOSIS — C7951 Secondary malignant neoplasm of bone: Secondary | ICD-10-CM

## 2018-05-17 ENCOUNTER — Other Ambulatory Visit: Payer: Self-pay

## 2018-05-17 ENCOUNTER — Encounter: Payer: Self-pay | Admitting: Radiation Oncology

## 2018-05-17 ENCOUNTER — Ambulatory Visit
Admission: RE | Admit: 2018-05-17 | Discharge: 2018-05-17 | Disposition: A | Discharge status: Home / Self Care | Payer: Medicare Other | Source: Ambulatory Visit | Attending: Radiation Oncology | Admitting: Radiation Oncology

## 2018-05-17 VITALS — BP 108/61 | HR 74 | Temp 96.4°F | Resp 16

## 2018-05-17 DIAGNOSIS — Z923 Personal history of irradiation: Secondary | ICD-10-CM | POA: Diagnosis not present

## 2018-05-17 DIAGNOSIS — C4492 Squamous cell carcinoma of skin, unspecified: Secondary | ICD-10-CM

## 2018-05-17 DIAGNOSIS — C6962 Malignant neoplasm of left orbit: Secondary | ICD-10-CM | POA: Insufficient documentation

## 2018-05-17 NOTE — Progress Notes (Signed)
Radiation Oncology Follow up Note  Name: Glenn Reeves   Date:   05/17/2018 MRN:  [id number] DOB: 18-Jun-1930    This 85 y.o. 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  REFERRING PROVIDER: Adin Hector, MD  HPI: patient is an 85 year old male now seen out 1 month having completed the course radiation therapy to his left periorbital region for locally advanced squamous cell carcinoma. He is seen today in routine follow-up and is had a fantastic response. He is doing well having no head and neck pain.he had a recent CT scan showing marked reduction in overall tumor mass with no evidence of intracranial hemorrhage or CT evidence of a large acute infarct. Tumor does extend and encroach again towards the orbit.  COMPLICATIONS OF TREATMENT: none  FOLLOW UP COMPLIANCE: keeps appointments   PHYSICAL EXAM:  BP 108/61   Pulse 74   Temp (!) 96.4 F (35.8 C)   Resp 16  Marked reduction in size and ulceration of the large left periorbital mass. No evidence of drying of his left eye vision appears intact no evidence of oral mucositis. Neck is clear without evidence of adenopathy.Well-developed well-nourished patient in NAD. HEENT reveals PERLA, EOMI, discs not visualized.  Oral cavity is clear. No oral mucosal lesions are identified. Neck is clear without evidence of cervical or supraclavicular adenopathy. Lungs are clear to A&P. Cardiac examination is essentially unremarkable with regular rate and rhythm without murmur rub or thrill. Abdomen is benign with no organomegaly or masses noted. Motor sensory and DTR levels are equal and symmetric in the upper and lower extremities. Cranial nerves II through XII are grossly intact. Proprioception is intact. No peripheral adenopathy or edema is identified. No motor or sensory levels are noted. Crude visual fields are within normal  range.  RADIOLOGY RESULTS: CT scan is reviewed and compatible with the above-stated findings  PLAN: present time patient is doing extremely well with excellent result from palliative radiation therapy. I'm please was overall progress. I've asked to see him back in 3-4 months for follow-up. He continues close follow-up care with Dr. Caryl Comes. Family knows to call with any concerns at any time. He is being evaluated for possible hospice placement.  I would like to take this opportunity to thank you for allowing me to participate in the care of your patient.Noreene Filbert, MD

## 2018-06-04 ENCOUNTER — Encounter: Payer: Self-pay | Admitting: Emergency Medicine

## 2018-06-04 ENCOUNTER — Emergency Department: Payer: Medicare Other

## 2018-06-04 ENCOUNTER — Other Ambulatory Visit: Payer: Self-pay

## 2018-06-04 ENCOUNTER — Emergency Department
Admission: EM | Admit: 2018-06-04 | Discharge: 2018-06-04 | Disposition: A | Discharge status: Home / Self Care | Payer: Medicare Other | Source: Home / Self Care | Attending: Student in an Organized Health Care Education/Training Program | Admitting: Student in an Organized Health Care Education/Training Program

## 2018-06-04 DIAGNOSIS — Z87891 Personal history of nicotine dependence: Secondary | ICD-10-CM | POA: Insufficient documentation

## 2018-06-04 DIAGNOSIS — W0110XA Fall on same level from slipping, tripping and stumbling with subsequent striking against unspecified object, initial encounter: Secondary | ICD-10-CM

## 2018-06-04 DIAGNOSIS — I129 Hypertensive chronic kidney disease with stage 1 through stage 4 chronic kidney disease, or unspecified chronic kidney disease: Secondary | ICD-10-CM

## 2018-06-04 DIAGNOSIS — W19XXXA Unspecified fall, initial encounter: Secondary | ICD-10-CM

## 2018-06-04 DIAGNOSIS — N183 Chronic kidney disease, stage 3 (moderate): Secondary | ICD-10-CM | POA: Insufficient documentation

## 2018-06-04 DIAGNOSIS — E039 Hypothyroidism, unspecified: Secondary | ICD-10-CM | POA: Insufficient documentation

## 2018-06-04 DIAGNOSIS — Y9389 Activity, other specified: Secondary | ICD-10-CM | POA: Insufficient documentation

## 2018-06-04 DIAGNOSIS — Z23 Encounter for immunization: Secondary | ICD-10-CM

## 2018-06-04 DIAGNOSIS — I2510 Atherosclerotic heart disease of native coronary artery without angina pectoris: Secondary | ICD-10-CM

## 2018-06-04 DIAGNOSIS — Z794 Long term (current) use of insulin: Secondary | ICD-10-CM | POA: Insufficient documentation

## 2018-06-04 DIAGNOSIS — Z85828 Personal history of other malignant neoplasm of skin: Secondary | ICD-10-CM | POA: Insufficient documentation

## 2018-06-04 DIAGNOSIS — S01111A Laceration without foreign body of right eyelid and periocular area, initial encounter: Secondary | ICD-10-CM

## 2018-06-04 DIAGNOSIS — Z79899 Other long term (current) drug therapy: Secondary | ICD-10-CM | POA: Insufficient documentation

## 2018-06-04 DIAGNOSIS — Y998 Other external cause status: Secondary | ICD-10-CM | POA: Insufficient documentation

## 2018-06-04 DIAGNOSIS — E1122 Type 2 diabetes mellitus with diabetic chronic kidney disease: Secondary | ICD-10-CM | POA: Insufficient documentation

## 2018-06-04 DIAGNOSIS — Y92002 Bathroom of unspecified non-institutional (private) residence single-family (private) house as the place of occurrence of the external cause: Secondary | ICD-10-CM

## 2018-06-04 LAB — COMPREHENSIVE METABOLIC PANEL
ALK PHOS: 61 U/L (ref 38–126)
ALT: 18 U/L (ref 0–44)
AST: 27 U/L (ref 15–41)
Albumin: 2.5 g/dL — ABNORMAL LOW (ref 3.5–5.0)
Anion gap: 3 — ABNORMAL LOW (ref 5–15)
BUN: 28 mg/dL — AB (ref 8–23)
CO2: 32 mmol/L (ref 22–32)
Calcium: 8.4 mg/dL — ABNORMAL LOW (ref 8.9–10.3)
Chloride: 106 mmol/L (ref 98–111)
Creatinine, Ser: 1.3 mg/dL — ABNORMAL HIGH (ref 0.61–1.24)
GFR calc Af Amer: 58 mL/min — ABNORMAL LOW (ref >60)
GFR calc non Af Amer: 50 mL/min — ABNORMAL LOW (ref >60)
Glucose, Bld: 107 mg/dL — ABNORMAL HIGH (ref 70–99)
Potassium: 4.7 mmol/L (ref 3.5–5.1)
Sodium: 141 mmol/L (ref 135–145)
Total Bilirubin: 0.8 mg/dL (ref 0.3–1.2)
Total Protein: 7.5 g/dL (ref 6.5–8.1)

## 2018-06-04 LAB — CBC WITH DIFFERENTIAL/PLATELET
Abs Immature Granulocytes: 0.02 10*3/uL (ref 0.00–0.07)
Basophils Absolute: 0 10*3/uL (ref 0.0–0.1)
Basophils Relative: 1 %
Eosinophils Absolute: 0.4 10*3/uL (ref 0.0–0.5)
Eosinophils Relative: 6 %
HCT: 39.1 % (ref 39.0–52.0)
Hemoglobin: 11.9 g/dL — ABNORMAL LOW (ref 13.0–17.0)
Immature Granulocytes: 0 %
LYMPHS PCT: 40 %
Lymphs Abs: 3.1 10*3/uL (ref 0.7–4.0)
MCH: 27.6 pg (ref 26.0–34.0)
MCHC: 30.4 g/dL (ref 30.0–36.0)
MCV: 90.7 fL (ref 80.0–100.0)
Monocytes Absolute: 0.6 10*3/uL (ref 0.1–1.0)
Monocytes Relative: 8 %
Neutro Abs: 3.6 10*3/uL (ref 1.7–7.7)
Neutrophils Relative %: 45 %
Platelets: 148 10*3/uL — ABNORMAL LOW (ref 150–400)
RBC: 4.31 MIL/uL (ref 4.22–5.81)
RDW: 17.2 % — ABNORMAL HIGH (ref 11.5–15.5)
WBC: 7.9 10*3/uL (ref 4.0–10.5)
nRBC: 0 % (ref 0.0–0.2)

## 2018-06-04 LAB — TROPONIN I
Troponin I: <0.03 ng/mL (ref <0.03)
Troponin I: <0.03 ng/mL (ref <0.03)

## 2018-06-04 MED ORDER — LIDOCAINE HCL (PF) 1 % IJ SOLN
5.0000 mL | Freq: Once | INTRAMUSCULAR | Status: AC
  Administered 2018-06-04: 5 mL via INTRADERMAL
  Filled 2018-06-04: qty 5

## 2018-06-04 MED ORDER — LIDOCAINE-EPINEPHRINE-TETRACAINE (LET) SOLUTION
3.0000 mL | Freq: Once | NASAL | Status: AC
  Administered 2018-06-04: 3 mL via TOPICAL
  Filled 2018-06-04: qty 3

## 2018-06-04 MED ORDER — BACITRACIN-NEOMYCIN-POLYMYXIN 400-5-5000 EX OINT
TOPICAL_OINTMENT | CUTANEOUS | Status: AC
  Filled 2018-06-04: qty 2

## 2018-06-04 MED ORDER — TETANUS-DIPHTH-ACELL PERTUSSIS 5-2.5-18.5 LF-MCG/0.5 IM SUSP
0.5000 mL | Freq: Once | INTRAMUSCULAR | Status: AC
  Administered 2018-06-04: 0.5 mL via INTRAMUSCULAR
  Filled 2018-06-04: qty 0.5

## 2018-06-04 NOTE — ED Notes (Signed)
Pt given applesauce and water. 

## 2018-06-04 NOTE — ED Provider Notes (Signed)
[facility] Emergency Department Provider Note    First MD Initiated Contact with Patient 06/04/18 1132     (approximate)  I have reviewed the triage vital signs and the nursing notes.   HISTORY  Chief Complaint Fall    HPI Glenn Reeves is a 85 y.o. male below listed past medical history no longer on any anticoagulation presents to the ER after fall with head injury that occurred today.  Typically ambulates with a walker.  Daughter at bedside with him  states that he most of been feeling well this morning and got up on his own to go to the bathroom which she typically does with assistance.  She heard a loud thud and went to check on him and he had hit his head with a laceration of his forehead.   Past Medical History:  Diagnosis Date  . Atrial fibrillation ([place])   . CAD (coronary artery disease)   . Cancer (HCC)    squamous cell - left ear  . Diabetes mellitus without complication ([place])   . GI bleed   . Hypertension   . Pulmonary fibrosis ([place])   . Thyroid disease    Family History  Problem Relation Age of Onset  . Heart attack Mother   . Heart attack Father   . Heart disease Brother   . Stroke Brother   . Autoimmune disease Neg Hx    Past Surgical History:  Procedure Laterality Date  . ESOPHAGOGASTRODUODENOSCOPY (EGD) WITH PROPOFOL N/A 06/29/2016   Procedure: ESOPHAGOGASTRODUODENOSCOPY (EGD) WITH PROPOFOL;  Surgeon: [name], MD;  Location: [facility] ENDOSCOPY;  Service: Endoscopy;  Laterality: N/A;   Patient Active Problem List   Diagnosis Date Noted  . Cellulitis of left elbow 03/17/2018  . Weight loss 03/17/2018  . Acute hypoxemic respiratory failure ([place]) 03/06/2018  . Pressure injury of skin 03/06/2018  . Protein-calorie malnutrition, severe 03/06/2018  . Squamous cell carcinoma of skin 01/02/2018  . Acquired hypothyroidism 08/03/2016  . Atrial fibrillation and flutter ([place]) 08/03/2016  . CKD (chronic kidney disease) stage  3, GFR 30-59 ml/min (HCC) 08/03/2016  . Controlled type 2 diabetes mellitus with complication, without long-term current use of insulin ([place]) 08/03/2016  . Essential hypertension 08/03/2016  . MCI (mild cognitive impairment) with memory loss 08/03/2016  . Peripheral vascular disease ([place]) 08/03/2016  . Senile purpura ([place]) 08/03/2016  . Spinal stenosis of lumbar region without neurogenic claudication 08/03/2016  . Thoracic aortic aneurysm without rupture ([place]) 08/03/2016  . Pulmonary fibrosis ([place]) 08/02/2016  . HCAP (healthcare-associated pneumonia)   . Other fatigue   . Palliative care by specialist   . Goals of care, counseling/discussion   . Blood in stool   . Chronic duodenal ulcer with hemorrhage   . Gastritis without bleeding   . Sepsis ([place]) 06/26/2016  . Chronic obstructive pulmonary disease ([place]) 05/21/2016  . COPD (chronic obstructive pulmonary disease) ([place]) 02/28/2016  . OSA (obstructive sleep apnea) 02/28/2016      Prior to Admission medications   Medication Sig Start Date End Date Taking? Authorizing Provider  carvedilol (COREG) 6.25 MG tablet Take 6.25 mg by mouth 2 (two) times daily. 06/13/16   [provider]  cephALEXin (KEFLEX) 500 MG capsule Take 1 capsule (500 mg total) by mouth 2 (two) times daily. 03/15/18   Earlie Server, MD  dronabinol (MARINOL) 5 MG capsule Take 1 capsule (5 mg total) by mouth 2 (two) times daily before a meal. 03/15/18   Earlie Server, MD  finasteride (  PROSCAR) 5 MG tablet Take 5 mg by mouth daily. 06/07/16   [provider]  furosemide (LASIX) 20 MG tablet Take 20 mg by mouth every other day.     [provider]  HUMALOG KWIKPEN 100 UNIT/ML KiwkPen Inject 12 Units into the skin See admin instructions. Inject 12u under the skin with meals as directed for blood sugar over 150    [provider]  ipratropium-albuterol (DUONEB) 0.5-2.5 (3) MG/3ML SOLN Inhale 3 mLs into the lungs every evening.     [provider]  LEVEMIR FLEXTOUCH 100 UNIT/ML Pen Inject 5 Units into the skin at bedtime.     [provider]  levothyroxine (SYNTHROID, LEVOTHROID) 75 MCG tablet Take 75 mcg by mouth daily. 06/07/16   [provider]  montelukast (SINGULAIR) 10 MG tablet Take 1 tablet by mouth daily. 06/07/16   [provider]  pantoprazole (PROTONIX) 40 MG tablet Take 40 mg by mouth daily.    [provider]  polycarbophil (FIBERCON) 625 MG tablet Take 625 mg by mouth daily.     [provider]  rosuvastatin (CRESTOR) 20 MG tablet Take 10 mg by mouth every evening.     [provider]  tamsulosin (FLOMAX) 0.4 MG CAPS capsule Take 0.4 mg by mouth daily.     [provider]    Allergies Codeine    Social History Social History   Tobacco Use  . Smoking status: Former Smoker    Types: Cigarettes    Last attempt to quit: 01/03/1988    Years since quitting: 30.4  . Smokeless tobacco: Never Used  . Tobacco comment: smoked in the winter  Substance Use Topics  . Alcohol use: No  . Drug use: No    Review of Systems Patient denies headaches, rhinorrhea, blurry vision, numbness, shortness of breath, chest pain, edema, cough, abdominal pain, nausea, vomiting, diarrhea, dysuria, fevers, rashes or hallucinations unless otherwise stated above in HPI. ____________________________________________   PHYSICAL EXAM:  VITAL SIGNS: Vitals:   06/04/18 1310 06/04/18 1403  BP:  130/78  Pulse:  83  Resp:  (!) 21  Temp: 97.9 F (36.6 C)   SpO2:  99%    Constitutional: Alert, ill appearing Eyes: Conjunctivae are normal.  Head: 3cm laceration to right forehead, no proptosis Nose: No congestion/rhinnorhea. Mouth/Throat: Mucous membranes are moist.   Neck: No stridor. Painless ROM.  Cardiovascular: Normal rate, regular rhythm. Grossly normal heart sounds.  Good peripheral circulation. Respiratory: mild tachypnea reportedly at baseline per family.  No  retractions. Lungs with coarse bibasilar bs Gastrointestinal: Soft and nontender. No distention. No abdominal bruits. No CVA tenderness. Genitourinary: deferred Musculoskeletal: No lower extremity tenderness nor edema.  No joint effusions. Neurologic:  Normal speech and language. No gross focal neurologic deficits are appreciated. No facial droop Skin:  Skin is warm, dry.  Laceration as described above. Psychiatric: Mood and affect are normal. Speech and behavior are normal.  ____________________________________________   LABS (all labs ordered are listed, but only abnormal results are displayed)  Results for orders placed or performed during the hospital encounter of 06/04/18 (from the past 24 hour(s))  CBC with Differential     Status: Abnormal   Collection Time: 06/04/18 11:19 AM  Result Value Ref Range   WBC 7.9 4.0 - 10.5 K/uL   RBC 4.31 4.22 - 5.81 MIL/uL   Hemoglobin 11.9 (L) 13.0 - 17.0 g/dL   HCT 39.1 39.0 - 52.0 %   MCV 90.7 80.0 - 100.0  fL   MCH 27.6 26.0 - 34.0 pg   MCHC 30.4 30.0 - 36.0 g/dL   RDW 17.2 (H) 11.5 - 15.5 %   Platelets 148 (L) 150 - 400 K/uL   nRBC 0.0 0.0 - 0.2 %   Neutrophils Relative % 45 %   Neutro Abs 3.6 1.7 - 7.7 K/uL   Lymphocytes Relative 40 %   Lymphs Abs 3.1 0.7 - 4.0 K/uL   Monocytes Relative 8 %   Monocytes Absolute 0.6 0.1 - 1.0 K/uL   Eosinophils Relative 6 %   Eosinophils Absolute 0.4 0.0 - 0.5 K/uL   Basophils Relative 1 %   Basophils Absolute 0.0 0.0 - 0.1 K/uL   Immature Granulocytes 0 %   Abs Immature Granulocytes 0.02 0.00 - 0.07 K/uL  Comprehensive metabolic panel     Status: Abnormal   Collection Time: 06/04/18 11:19 AM  Result Value Ref Range   Sodium 141 135 - 145 mmol/L   Potassium 4.7 3.5 - 5.1 mmol/L   Chloride 106 98 - 111 mmol/L   CO2 32 22 - 32 mmol/L   Glucose, Bld 107 (H) 70 - 99 mg/dL   BUN 28 (H) 8 - 23 mg/dL   Creatinine, Ser 1.30 (H) 0.61 - 1.24 mg/dL   Calcium 8.4 (L) 8.9 - 10.3 mg/dL   Total Protein 7.5  6.5 - 8.1 g/dL   Albumin 2.5 (L) 3.5 - 5.0 g/dL   AST 27 15 - 41 U/L   ALT 18 0 - 44 U/L   Alkaline Phosphatase 61 38 - 126 U/L   Total Bilirubin 0.8 0.3 - 1.2 mg/dL   GFR calc non Af Amer 50 (L) >60 mL/min   GFR calc Af Amer 58 (L) >60 mL/min   Anion gap 3 (L) 5 - 15  Troponin I - ONCE - STAT     Status: None   Collection Time: 06/04/18 11:19 AM  Result Value Ref Range   Troponin I <0.03 <0.03 ng/mL  Troponin I - ONCE - STAT     Status: None   Collection Time: 06/04/18  2:10 PM  Result Value Ref Range   Troponin I <0.03 <0.03 ng/mL   ____________________________________________  EKG My review and personal interpretation at Time: 11:18   Indication: fall  Rate: 100  Rhythm: afib Axis: left Other: no stemi, nonspecific st abn, abnml ekg, unchanged from previous 03/05/18 ____________________________________________  RADIOLOGY  I personally reviewed all radiographic images ordered to evaluate for the above acute complaints and reviewed radiology reports and findings.  These findings were personally discussed with the patient.  Please see medical record for radiology report.  ____________________________________________   PROCEDURES  Procedure(s) performed:  Marland KitchenMarland KitchenLaceration Repair Date/Time: 06/04/2018 2:29 PM Performed by: Merlyn Lot, MD Authorized by: Merlyn Lot, MD   Consent:    Consent obtained:  Verbal   Consent given by:  Patient Laceration details:    Location:  Face   Face location:  R eyebrow   Length (cm):  2   Depth (mm):  3 Repair type:    Repair type:  Simple Exploration:    Hemostasis achieved with:  LET Treatment:    Area cleansed with:  Betadine Skin repair:    Repair method:  Sutures   Suture size:  5-0   Suture material:  Nylon   Suture technique:  Simple interrupted   Number of sutures:  3 Approximation:    Approximation:  Close Post-procedure details:    Dressing:  Open (no  dressing)   Patient tolerance of procedure:   Tolerated well, no immediate complications      Critical Care performed: no ____________________________________________   INITIAL IMPRESSION / ASSESSMENT AND PLAN / ED COURSE  Pertinent labs & imaging results that were available during my care of the patient were reviewed by me and considered in my medical decision making (see chart for details).   DDX: contusion, laceration, sdh, sah, iph, edh  Cori Henningsen is a 85 y.o. who presents to the ED with fall as described above.  Patient with multiple chronic comorbidities.  This was an unwitnessed fall but sounds like patient was nauseous to be ambulating without assistance to begin with likely mechanical.  CT imaging will be ordered to evaluate for the but differential.  Blood will be sent as well.  EKG abnormal but similar to previous EKGs.  Will order serial enzymes to further re-stratify.  Clinical Course as of Jun 04 1522  Tue Jun 04, 2018  1505 Repeat troponin is negative.  Patient tolerating oral hydration.  This point believe he stable and appropriate for outpatient follow-up.   [PR]    Clinical Course User Index [PR] Merlyn Lot, MD     As part of my medical decision making, I reviewed the following data within the [facility] notes reviewed and incorporated, Labs reviewed, notes from prior ED visits and [place] Controlled Substance Database   ____________________________________________   FINAL CLINICAL IMPRESSION(S) / ED DIAGNOSES  Final diagnoses:  Fall, initial encounter  Laceration of right eyebrow, initial encounter      NEW MEDICATIONS STARTED DURING THIS VISIT:  New Prescriptions   No medications on file     Note:  This document was prepared using Dragon voice recognition software and may include unintentional dictation errors.    Merlyn Lot, MD 06/04/18 1524

## 2018-06-04 NOTE — ED Triage Notes (Signed)
Pt had unwitnessed fall in the bathroom. Pt does not remember falling but does report neck pain. Pt's daughter heard noise in bathroom and found him on the floor. Pt had hematoma to forehead with laceration. Difference to left eye; pt reports that is his baseline. Pt arrives alert. Pt also has skin tear to right knuckle.   EMS Reports  CBG 122

## 2018-06-04 NOTE — ED Notes (Signed)
Patient transported to CT 

## 2018-06-05 ENCOUNTER — Encounter: Payer: Self-pay | Admitting: Emergency Medicine

## 2018-06-05 ENCOUNTER — Inpatient Hospital Stay: Payer: Medicare Other

## 2018-06-05 ENCOUNTER — Inpatient Hospital Stay
Admission: EM | Admit: 2018-06-05 | Discharge: 2018-06-23 | DRG: 640 | Disposition: E | Payer: Medicare Other | Attending: Internal Medicine | Admitting: Internal Medicine

## 2018-06-05 ENCOUNTER — Other Ambulatory Visit: Payer: Self-pay

## 2018-06-05 DIAGNOSIS — Y92002 Bathroom of unspecified non-institutional (private) residence single-family (private) house as the place of occurrence of the external cause: Secondary | ICD-10-CM | POA: Diagnosis not present

## 2018-06-05 DIAGNOSIS — I482 Chronic atrial fibrillation, unspecified: Secondary | ICD-10-CM | POA: Diagnosis present

## 2018-06-05 DIAGNOSIS — Z66 Do not resuscitate: Secondary | ICD-10-CM | POA: Diagnosis present

## 2018-06-05 DIAGNOSIS — Z79899 Other long term (current) drug therapy: Secondary | ICD-10-CM | POA: Diagnosis not present

## 2018-06-05 DIAGNOSIS — W1830XA Fall on same level, unspecified, initial encounter: Secondary | ICD-10-CM | POA: Diagnosis present

## 2018-06-05 DIAGNOSIS — S0101XA Laceration without foreign body of scalp, initial encounter: Secondary | ICD-10-CM | POA: Diagnosis present

## 2018-06-05 DIAGNOSIS — N183 Chronic kidney disease, stage 3 (moderate): Secondary | ICD-10-CM | POA: Diagnosis present

## 2018-06-05 DIAGNOSIS — N179 Acute kidney failure, unspecified: Secondary | ICD-10-CM | POA: Diagnosis not present

## 2018-06-05 DIAGNOSIS — Z79890 Hormone replacement therapy (postmenopausal): Secondary | ICD-10-CM | POA: Diagnosis not present

## 2018-06-05 DIAGNOSIS — Z923 Personal history of irradiation: Secondary | ICD-10-CM | POA: Diagnosis not present

## 2018-06-05 DIAGNOSIS — R2689 Other abnormalities of gait and mobility: Secondary | ICD-10-CM | POA: Diagnosis present

## 2018-06-05 DIAGNOSIS — I2510 Atherosclerotic heart disease of native coronary artery without angina pectoris: Secondary | ICD-10-CM | POA: Diagnosis present

## 2018-06-05 DIAGNOSIS — J9610 Chronic respiratory failure, unspecified whether with hypoxia or hypercapnia: Secondary | ICD-10-CM | POA: Diagnosis present

## 2018-06-05 DIAGNOSIS — L89213 Pressure ulcer of right hip, stage 3: Secondary | ICD-10-CM | POA: Diagnosis present

## 2018-06-05 DIAGNOSIS — Z23 Encounter for immunization: Secondary | ICD-10-CM

## 2018-06-05 DIAGNOSIS — J449 Chronic obstructive pulmonary disease, unspecified: Secondary | ICD-10-CM | POA: Diagnosis present

## 2018-06-05 DIAGNOSIS — E860 Dehydration: Secondary | ICD-10-CM | POA: Diagnosis present

## 2018-06-05 DIAGNOSIS — Z85828 Personal history of other malignant neoplasm of skin: Secondary | ICD-10-CM

## 2018-06-05 DIAGNOSIS — Z794 Long term (current) use of insulin: Secondary | ICD-10-CM | POA: Diagnosis not present

## 2018-06-05 DIAGNOSIS — T148XXA Other injury of unspecified body region, initial encounter: Secondary | ICD-10-CM

## 2018-06-05 DIAGNOSIS — I480 Paroxysmal atrial fibrillation: Secondary | ICD-10-CM | POA: Diagnosis present

## 2018-06-05 DIAGNOSIS — I129 Hypertensive chronic kidney disease with stage 1 through stage 4 chronic kidney disease, or unspecified chronic kidney disease: Secondary | ICD-10-CM | POA: Diagnosis present

## 2018-06-05 DIAGNOSIS — J8410 Pulmonary fibrosis, unspecified: Secondary | ICD-10-CM | POA: Diagnosis present

## 2018-06-05 DIAGNOSIS — Z87891 Personal history of nicotine dependence: Secondary | ICD-10-CM | POA: Diagnosis not present

## 2018-06-05 DIAGNOSIS — R55 Syncope and collapse: Secondary | ICD-10-CM

## 2018-06-05 DIAGNOSIS — Z515 Encounter for palliative care: Secondary | ICD-10-CM | POA: Diagnosis not present

## 2018-06-05 DIAGNOSIS — E1122 Type 2 diabetes mellitus with diabetic chronic kidney disease: Secondary | ICD-10-CM | POA: Diagnosis present

## 2018-06-05 DIAGNOSIS — Z9981 Dependence on supplemental oxygen: Secondary | ICD-10-CM

## 2018-06-05 DIAGNOSIS — I951 Orthostatic hypotension: Secondary | ICD-10-CM | POA: Diagnosis present

## 2018-06-05 LAB — GLUCOSE, CAPILLARY
Glucose-Capillary: 108 mg/dL — ABNORMAL HIGH (ref 70–99)
Glucose-Capillary: 130 mg/dL — ABNORMAL HIGH (ref 70–99)
Glucose-Capillary: 88 mg/dL (ref 70–99)

## 2018-06-05 LAB — COMPREHENSIVE METABOLIC PANEL
ALBUMIN: 2.4 g/dL — AB (ref 3.5–5.0)
ALK PHOS: 60 U/L (ref 38–126)
ALT: 20 U/L (ref 0–44)
AST: 34 U/L (ref 15–41)
Anion gap: 4 — ABNORMAL LOW (ref 5–15)
BILIRUBIN TOTAL: 0.9 mg/dL (ref 0.3–1.2)
BUN: 28 mg/dL — ABNORMAL HIGH (ref 8–23)
CO2: 31 mmol/L (ref 22–32)
Calcium: 8.3 mg/dL — ABNORMAL LOW (ref 8.9–10.3)
Chloride: 105 mmol/L (ref 98–111)
Creatinine, Ser: 1.25 mg/dL — ABNORMAL HIGH (ref 0.61–1.24)
GFR calc Af Amer: >60 mL/min (ref >60)
GFR calc non Af Amer: 52 mL/min — ABNORMAL LOW (ref >60)
GLUCOSE: 130 mg/dL — AB (ref 70–99)
Potassium: 4.4 mmol/L (ref 3.5–5.1)
Sodium: 140 mmol/L (ref 135–145)
TOTAL PROTEIN: 7.2 g/dL (ref 6.5–8.1)

## 2018-06-05 LAB — TROPONIN I
Troponin I: <0.03 ng/mL (ref <0.03)
Troponin I: 0.03 ng/mL (ref <0.03)
Troponin I: 0.03 ng/mL (ref <0.03)
Troponin I: 0.03 ng/mL (ref <0.03)

## 2018-06-05 LAB — CBC WITH DIFFERENTIAL/PLATELET
Abs Immature Granulocytes: 0.03 10*3/uL (ref 0.00–0.07)
BASOS ABS: 0 10*3/uL (ref 0.0–0.1)
Basophils Relative: 0 %
EOS PCT: 3 %
Eosinophils Absolute: 0.3 10*3/uL (ref 0.0–0.5)
HCT: 36.2 % — ABNORMAL LOW (ref 39.0–52.0)
Hemoglobin: 11.1 g/dL — ABNORMAL LOW (ref 13.0–17.0)
Immature Granulocytes: 0 %
Lymphocytes Relative: 28 %
Lymphs Abs: 2.7 10*3/uL (ref 0.7–4.0)
MCH: 28.2 pg (ref 26.0–34.0)
MCHC: 30.7 g/dL (ref 30.0–36.0)
MCV: 91.9 fL (ref 80.0–100.0)
Monocytes Absolute: 0.8 10*3/uL (ref 0.1–1.0)
Monocytes Relative: 9 %
NRBC: 0 % (ref 0.0–0.2)
Neutro Abs: 5.5 10*3/uL (ref 1.7–7.7)
Neutrophils Relative %: 60 %
Platelets: 149 10*3/uL — ABNORMAL LOW (ref 150–400)
RBC: 3.94 MIL/uL — AB (ref 4.22–5.81)
RDW: 17.1 % — ABNORMAL HIGH (ref 11.5–15.5)
WBC: 9.4 10*3/uL (ref 4.0–10.5)

## 2018-06-05 LAB — MRSA PCR SCREENING: MRSA BY PCR: NEGATIVE

## 2018-06-05 MED ORDER — CARVEDILOL 6.25 MG PO TABS
6.2500 mg | ORAL_TABLET | Freq: Two times a day (BID) | ORAL | Status: DC
  Administered 2018-06-05: 6.25 mg via ORAL
  Filled 2018-06-05: qty 1

## 2018-06-05 MED ORDER — ACETAMINOPHEN 650 MG RE SUPP: 650.0000 mg | Freq: Four times a day (QID) | RECTAL | Status: DC | PRN

## 2018-06-05 MED ORDER — INSULIN DETEMIR 100 UNIT/ML ~~LOC~~ SOLN
5.0000 [IU] | Freq: Every day | SUBCUTANEOUS | Status: DC
  Filled 2018-06-05 (×3): qty 0.05

## 2018-06-05 MED ORDER — SODIUM CHLORIDE 0.9 % IV BOLUS
500.0000 mL | Freq: Once | INTRAVENOUS | Status: AC
  Administered 2018-06-05: 09:00:00 via INTRAVENOUS

## 2018-06-05 MED ORDER — SODIUM CHLORIDE 0.9% FLUSH: 10.0000 mL | INTRAVENOUS | Status: DC | PRN

## 2018-06-05 MED ORDER — CALCIUM POLYCARBOPHIL 625 MG PO TABS
625.0000 mg | ORAL_TABLET | Freq: Every day | ORAL | Status: DC
  Administered 2018-06-06: 625 mg via ORAL
  Filled 2018-06-05 (×3): qty 1

## 2018-06-05 MED ORDER — SODIUM CHLORIDE 0.9% FLUSH
3.0000 mL | Freq: Two times a day (BID) | INTRAVENOUS | Status: DC
  Administered 2018-06-05 – 2018-06-07 (×3): 3 mL via INTRAVENOUS

## 2018-06-05 MED ORDER — ROSUVASTATIN CALCIUM 5 MG PO TABS
5.0000 mg | ORAL_TABLET | Freq: Every evening | ORAL | Status: DC
  Administered 2018-06-05 – 2018-06-06 (×2): 5 mg via ORAL
  Filled 2018-06-05 (×2): qty 1

## 2018-06-05 MED ORDER — MONTELUKAST SODIUM 10 MG PO TABS
10.0000 mg | ORAL_TABLET | Freq: Every day | ORAL | Status: DC
  Administered 2018-06-05 – 2018-06-06 (×2): 10 mg via ORAL
  Filled 2018-06-05 (×3): qty 1

## 2018-06-05 MED ORDER — SODIUM CHLORIDE 0.9% FLUSH: 10.0000 mL | Freq: Two times a day (BID) | INTRAVENOUS | Status: DC

## 2018-06-05 MED ORDER — FINASTERIDE 5 MG PO TABS
5.0000 mg | ORAL_TABLET | Freq: Every day | ORAL | Status: DC
  Administered 2018-06-05 – 2018-06-07 (×3): 5 mg via ORAL
  Filled 2018-06-05 (×3): qty 1

## 2018-06-05 MED ORDER — ONDANSETRON HCL 4 MG/2ML IJ SOLN: 4.0000 mg | Freq: Four times a day (QID) | INTRAMUSCULAR | Status: DC | PRN

## 2018-06-05 MED ORDER — IPRATROPIUM-ALBUTEROL 0.5-2.5 (3) MG/3ML IN SOLN
3.0000 mL | Freq: Every evening | RESPIRATORY_TRACT | Status: DC
  Administered 2018-06-05 – 2018-06-06 (×2): 3 mL via RESPIRATORY_TRACT
  Filled 2018-06-05 (×2): qty 3

## 2018-06-05 MED ORDER — TAMSULOSIN HCL 0.4 MG PO CAPS
0.4000 mg | ORAL_CAPSULE | Freq: Every day | ORAL | Status: DC
  Administered 2018-06-05 – 2018-06-07 (×3): 0.4 mg via ORAL
  Filled 2018-06-05 (×3): qty 1

## 2018-06-05 MED ORDER — CARVEDILOL 3.125 MG PO TABS
3.1250 mg | ORAL_TABLET | Freq: Two times a day (BID) | ORAL | Status: DC
  Administered 2018-06-06: 3.125 mg via ORAL
  Filled 2018-06-05: qty 1

## 2018-06-05 MED ORDER — SENNOSIDES-DOCUSATE SODIUM 8.6-50 MG PO TABS: 1.0000 | ORAL_TABLET | Freq: Every evening | ORAL | Status: DC | PRN

## 2018-06-05 MED ORDER — ACETAMINOPHEN 325 MG PO TABS
650.0000 mg | ORAL_TABLET | Freq: Four times a day (QID) | ORAL | Status: DC | PRN
  Administered 2018-06-05 – 2018-06-06 (×2): 650 mg via ORAL
  Filled 2018-06-05 (×2): qty 2

## 2018-06-05 MED ORDER — LEVOTHYROXINE SODIUM 75 MCG PO TABS
75.0000 ug | ORAL_TABLET | Freq: Every day | ORAL | Status: DC
  Administered 2018-06-06 – 2018-06-07 (×2): 75 ug via ORAL
  Filled 2018-06-05 (×2): qty 1
  Filled 2018-06-05 (×2): qty 3
  Filled 2018-06-05: qty 1

## 2018-06-05 MED ORDER — OXYCODONE-ACETAMINOPHEN 5-325 MG PO TABS
2.0000 | ORAL_TABLET | Freq: Four times a day (QID) | ORAL | Status: DC | PRN
  Administered 2018-06-05 – 2018-06-06 (×4): 2 via ORAL
  Filled 2018-06-05 (×4): qty 2

## 2018-06-05 MED ORDER — ROSUVASTATIN CALCIUM 10 MG PO TABS: 10.0000 mg | ORAL_TABLET | Freq: Every evening | ORAL | Status: DC

## 2018-06-05 MED ORDER — DRONABINOL 2.5 MG PO CAPS
5.0000 mg | ORAL_CAPSULE | Freq: Two times a day (BID) | ORAL | Status: DC
  Administered 2018-06-05 – 2018-06-06 (×3): 5 mg via ORAL
  Filled 2018-06-05 (×5): qty 2

## 2018-06-05 MED ORDER — INSULIN ASPART 100 UNIT/ML ~~LOC~~ SOLN: 0.0000 [IU] | Freq: Every day | SUBCUTANEOUS | Status: DC

## 2018-06-05 MED ORDER — MORPHINE SULFATE (PF) 2 MG/ML IV SOLN
2.0000 mg | INTRAVENOUS | Status: DC | PRN
  Administered 2018-06-07: 2 mg via INTRAVENOUS
  Filled 2018-06-05: qty 1

## 2018-06-05 MED ORDER — INSULIN ASPART 100 UNIT/ML ~~LOC~~ SOLN
0.0000 [IU] | Freq: Three times a day (TID) | SUBCUTANEOUS | Status: DC
  Administered 2018-06-05: 1 [IU] via SUBCUTANEOUS
  Administered 2018-06-06: 2 [IU] via SUBCUTANEOUS
  Filled 2018-06-05 (×2): qty 1

## 2018-06-05 MED ORDER — ONDANSETRON HCL 4 MG PO TABS: 4.0000 mg | ORAL_TABLET | Freq: Four times a day (QID) | ORAL | Status: DC | PRN

## 2018-06-05 MED ORDER — SODIUM CHLORIDE 0.9 % IV SOLN
INTRAVENOUS | Status: DC
  Administered 2018-06-05 – 2018-06-07 (×4): via INTRAVENOUS

## 2018-06-05 MED ORDER — PANTOPRAZOLE SODIUM 40 MG PO TBEC
40.0000 mg | DELAYED_RELEASE_TABLET | Freq: Every day | ORAL | Status: DC
  Administered 2018-06-05 – 2018-06-07 (×3): 40 mg via ORAL
  Filled 2018-06-05 (×3): qty 1

## 2018-06-05 MED ORDER — ENOXAPARIN SODIUM 40 MG/0.4ML ~~LOC~~ SOLN
40.0000 mg | SUBCUTANEOUS | Status: DC
  Administered 2018-06-05: 40 mg via SUBCUTANEOUS
  Filled 2018-06-05 (×2): qty 0.4

## 2018-06-05 NOTE — ED Notes (Signed)
Patients daughter at bedside. Updated on plan of care.

## 2018-06-05 NOTE — Care Management Obs Status (Signed)
[facility] NOTIFICATION   Patient Details  Name: Glenn Reeves MRN: [id number] Date of Birth: 1930/09/26   Medicare Observation Status Notification Given:  Yes  Reviewed with daughter and patient- daughter signed form     Shelbie Hutching, RN 06/10/2018, 9:58 AM

## 2018-06-05 NOTE — H&P (Signed)
[facility] NAME: Glenn Reeves    MR#:  [id number]  DATE OF BIRTH:  Mar 01, 1931  DATE OF ADMISSION:  05/28/2018  PRIMARY CARE PHYSICIAN: Adin Hector, MD   REQUESTING/REFERRING PHYSICIAN:   CHIEF COMPLAINT:   Chief Complaint  Patient presents with  . Loss of Consciousness    HISTORY OF PRESENT ILLNESS: Glenn Reeves  is a 85 y.o. 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  Patient had a bowel movement over the family member and later on he stood up and then passed out.  He also had some radiation therapy to these frontal skull area.  Was evaluated in the emergency room with CT head and CT cervical spine which did not show any acute abnormality.  Hospitalist service was consulted for further care.  PAST MEDICAL HISTORY:   Past Medical History:  Diagnosis Date  . Atrial fibrillation ([place])   . CAD (coronary artery disease)   . Cancer (H[facility])    squamous cell - left ear  . Diabetes mellitus without complication ([place])   . GI bleed   . Hypertension   . Pulmonary fibrosis ([place])   . Thyroid disease     PAST SURGICAL HISTORY:  Past Surgical History:  Procedure Laterality Date  . ESOPHAGOGASTRODUODENOSCOPY (EGD) WITH PROPOFOL N/A 06/29/2016   Procedure: ESOPHAGOGASTRODUODENOSCOPY (EGD) WITH PROPOFOL;  Surgeon: [name], MD;  Location: [facility] ENDOSCOPY;  Service: Endoscopy;  Laterality: N/A;    SOCIAL HISTORY:  Social History   Tobacco Use  . Smoking status: Former Smoker    Types: Cigarettes    Last attempt to quit: 01/03/1988    Years since quitting: 30.4  . Smokeless tobacco: Never Used  . Tobacco comment: smoked in the winter  Substance Use Topics  .  Alcohol use: No    FAMILY HISTORY:  Family History  Problem Relation Age of Onset  . Heart attack Mother   . Heart attack Father   . Heart disease Brother   . Stroke Brother   . Autoimmune disease Neg Hx     DRUG ALLERGIES:  Allergies  Allergen Reactions  . Codeine Rash    REVIEW OF SYSTEMS:   CONSTITUTIONAL: No fever, has fatigue and weakness.  EYES: No blurred or double vision.  EARS, NOSE, AND THROAT: No tinnitus or ear pain.  RESPIRATORY: No cough, shortness of breath, wheezing or hemoptysis.  CARDIOVASCULAR: No chest pain, orthopnea, edema.  GASTROINTESTINAL: No nausea, vomiting, diarrhea or abdominal pain.  GENITOURINARY: No dysuria, hematuria.  ENDOCRINE: No polyuria, nocturia,  HEMATOLOGY: No anemia, easy bruising or bleeding SKIN: Laceration over frontal scalp area MUSCULOSKELETAL: No joint pain or arthritis.   NEUROLOGIC: No tingling, numbness, weakness.  PSYCHIATRY: No anxiety or depression.   MEDICATIONS AT HOME:  Prior to Admission medications   Medication Sig Start Date End Date Taking? Authorizing Provider  carvedilol (COREG) 6.25 MG tablet Take 6.25 mg by mouth 2 (two) times daily. 06/13/16   [provider]  cephALEXin (KEFLEX) 500 MG capsule Take 1 capsule (500 mg total) by mouth 2 (two) times daily. 03/15/18   Earlie Server, MD  dronabinol (MARINOL) 5 MG capsule Take 1 capsule (5 mg total) by mouth 2 (two) times daily before a meal.  03/15/18   Earlie Server, MD  finasteride (PROSCAR) 5 MG tablet Take 5 mg by mouth daily. 06/07/16   [provider]  furosemide (LASIX) 20 MG tablet Take 20 mg by mouth every other day.     [provider]  HUMALOG KWIKPEN 100 UNIT/ML KiwkPen Inject 12 Units into the skin See admin instructions. Inject 12u under the skin with meals as directed for blood sugar over 150    [provider]  ipratropium-albuterol (DUONEB) 0.5-2.5 (3) MG/3ML SOLN Inhale 3 mLs into the lungs every evening.     [provider]  LEVEMIR FLEXTOUCH 100 UNIT/ML Pen Inject 5 Units into the skin at bedtime.     [provider]  levothyroxine (SYNTHROID, LEVOTHROID) 75 MCG tablet Take 75 mcg by mouth daily. 06/07/16   [provider]  montelukast (SINGULAIR) 10 MG tablet Take 1 tablet by mouth daily. 06/07/16   [provider]  pantoprazole (PROTONIX) 40 MG tablet Take 40 mg by mouth daily.    [provider]  polycarbophil (FIBERCON) 625 MG tablet Take 625 mg by mouth daily.     [provider]  rosuvastatin (CRESTOR) 20 MG tablet Take 10 mg by mouth every evening.     [provider]  tamsulosin (FLOMAX) 0.4 MG CAPS capsule Take 0.4 mg by mouth daily.     [provider]      PHYSICAL EXAMINATION:   VITAL SIGNS: Blood pressure 129/78, pulse 77, temperature (!) 97.5 F (36.4 C), temperature source Oral, resp. rate 18, height 5\' 9"  (1.753 m), weight 72.2 kg, SpO2 99 %.  GENERAL:  85 y.o.-year-old patient lying in the bed with no acute distress.  EYES: Pupils equal, round, reactive to light and accommodation. No scleral icterus. Extraocular muscles intact.  HEENT: Head Laceration , ecchymosis around right eye . Oropharynx and nasopharynx clear.  NECK:  Supple, no jugular venous distention. No thyroid enlargement, no tenderness.  LUNGS: Normal breath sounds bilaterally, no wheezing, rales,rhonchi or crepitation. No use of accessory muscles of respiration.  CARDIOVASCULAR: S1, S2 normal. No murmurs, rubs, or gallops.  ABDOMEN: Soft, nontender, nondistended. Bowel sounds present. No organomegaly or mass.  EXTREMITIES: No pedal edema, cyanosis, or clubbing.  NEUROLOGIC: Cranial nerves II through XII are intact. Muscle strength 5/5 in all extremities. Sensation intact. Gait not checked.  PSYCHIATRIC: The patient is alert and oriented x 3.  SKIN: Laceration over frontal scalp and extremities Ecchymosis around right eye.  LABORATORY PANEL:    CBC Recent Labs  Lab 06/04/18 1119 06/19/2018 0833  WBC 7.9 9.4  HGB 11.9* 11.1*  HCT 39.1 36.2*  PLT 148* 149*  MCV 90.7 91.9  MCH 27.6 28.2  MCHC 30.4 30.7  RDW 17.2* 17.1*  LYMPHSABS 3.1 2.7  MONOABS 0.6 0.8  EOSABS 0.4 0.3  BASOSABS 0.0 0.0   ------------------------------------------------------------------------------------------------------------------  Chemistries  Recent Labs  Lab 06/04/18 1119 05/26/2018 0833  NA 141 140  K 4.7 4.4  CL 106 105  CO2 32 31  GLUCOSE 107* 130*  BUN 28* 28*  CREATININE 1.30* 1.25*  CALCIUM 8.4* 8.3*  AST 27 34  ALT 18 20  ALKPHOS 61 60  BILITOT 0.8 0.9   ------------------------------------------------------------------------------------------------------------------ estimated creatinine clearance is 43.2 mL/min (A) (by C-G formula based on SCr of 1.25 mg/dL (H)). ------------------------------------------------------------------------------------------------------------------ No results for input(s): TSH, T4TOTAL, T3FREE, THYROIDAB in the last 72 hours.  Invalid input(s): FREET3   Coagulation profile No results for input(s): INR, PROTIME in the last  168 hours. ------------------------------------------------------------------------------------------------------------------- No results for input(s): DDIMER in the last 72 hours. -------------------------------------------------------------------------------------------------------------------  Cardiac Enzymes Recent Labs  Lab 06/04/18 1119 06/04/18 1410 06/15/2018 0833  TROPONINI <0.03 <0.03 <0.03   ------------------------------------------------------------------------------------------------------------------ Invalid input(s): POCBNP  ---------------------------------------------------------------------------------------------------------------  Urinalysis    Component Value Date/Time   COLORURINE YELLOW (A) 03/06/2018 0521   APPEARANCEUR CLEAR (A) 03/06/2018  0521   LABSPEC >1.046 (H) 03/06/2018 0521   PHURINE 5.0 03/06/2018 0521   GLUCOSEU NEGATIVE 03/06/2018 0521   HGBUR NEGATIVE 03/06/2018 0521   BILIRUBINUR NEGATIVE 03/06/2018 0521   KETONESUR NEGATIVE 03/06/2018 0521   PROTEINUR NEGATIVE 03/06/2018 0521   NITRITE NEGATIVE 03/06/2018 0521   LEUKOCYTESUR NEGATIVE 03/06/2018 0521     RADIOLOGY: Ct Head Wo Contrast  Result Date: 06/04/2018 CLINICAL DATA:  Neck pain and scalp hematoma after unwitnessed fall. EXAM: CT HEAD WITHOUT CONTRAST CT CERVICAL SPINE WITHOUT CONTRAST TECHNIQUE: Multidetector CT imaging of the head and cervical spine was performed following the standard protocol without intravenous contrast. Multiplanar CT image reconstructions of the cervical spine were also generated. COMPARISON:  CT scan of May 14, 2018. FINDINGS: CT HEAD FINDINGS Brain: Mild diffuse cortical atrophy is noted. Mild chronic ischemic white matter disease is noted. Old lacunar infarction is noted in left basal ganglia. No midline shift is noted. Ventricular size is within normal limits. There is no evidence of hemorrhage or acute infarction. Stable appearance of destructive mass seen in the left frontal skull region. Vascular: No hyperdense vessel or unexpected calcification. 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 This extends into the left lateral orbital wall and left zygomatic arch. No acute traumatic fracture is noted. Sinuses/Orbits: Left ethmoid sinusitis is noted. Other: Fluid is noted in right mastoid air cells. CT CERVICAL SPINE FINDINGS Alignment: Normal. Skull base and vertebrae: No acute fracture. No primary bone lesion or focal pathologic process. Soft tissues and spinal canal: No prevertebral fluid or swelling. No visible canal hematoma. Disc levels: Moderate degenerative disc disease is noted at C4-5 and C6-7 with anterior osteophyte formation. Severe degenerative disc disease is noted at C5-6. Upper chest:  Extensive amount of scarring and pleural thickening is noted in both lung apices. Other: Degenerative changes are seen involving posterior facet joints bilaterally. IMPRESSION: Mild diffuse cortical atrophy. Mild chronic ischemic white matter disease. 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 No other significant intracranial abnormality seen. Left ethmoid sinusitis. Multilevel degenerative disc disease. No acute abnormality seen in the cervical spine. Electronically Signed   By: Marijo Conception, M.D.   On: 06/04/2018 12:31   Ct Cervical Spine Wo Contrast  Result Date: 06/04/2018 CLINICAL DATA:  Neck pain and scalp hematoma after unwitnessed fall. EXAM: CT HEAD WITHOUT CONTRAST CT CERVICAL SPINE WITHOUT CONTRAST TECHNIQUE: Multidetector CT imaging of the head and cervical spine was performed following the standard protocol without intravenous contrast. Multiplanar CT image reconstructions of the cervical spine were also generated. COMPARISON:  CT scan of May 14, 2018. FINDINGS: CT HEAD FINDINGS Brain: Mild diffuse cortical atrophy is noted. Mild chronic ischemic white matter disease is noted. Old lacunar infarction is noted in left basal ganglia. No midline shift is noted. Ventricular size is within normal limits. There is no evidence of hemorrhage or acute infarction. Stable appearance of destructive mass seen in the left frontal skull region. Vascular: No hyperdense vessel or unexpected calcification. 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 This extends into the left lateral  orbital wall and left zygomatic arch. No acute traumatic fracture is noted. Sinuses/Orbits: Left ethmoid sinusitis is noted. Other: Fluid is noted in right mastoid air cells. CT CERVICAL SPINE FINDINGS Alignment: Normal. Skull base and vertebrae: No acute fracture. No primary bone lesion or focal pathologic process. Soft tissues and spinal canal: No  prevertebral fluid or swelling. No visible canal hematoma. Disc levels: Moderate degenerative disc disease is noted at C4-5 and C6-7 with anterior osteophyte formation. Severe degenerative disc disease is noted at C5-6. Upper chest: Extensive amount of scarring and pleural thickening is noted in both lung apices. Other: Degenerative changes are seen involving posterior facet joints bilaterally. IMPRESSION: Mild diffuse cortical atrophy. Mild chronic ischemic white matter disease. 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 No other significant intracranial abnormality seen. Left ethmoid sinusitis. Multilevel degenerative disc disease. No acute abnormality seen in the cervical spine. Electronically Signed   By: Marijo Conception, M.D.   On: 06/04/2018 12:31   Dg Chest Portable 1 View  Result Date: 06/04/2018 CLINICAL DATA:  Unwitnessed fall today. EXAM: PORTABLE CHEST 1 VIEW COMPARISON:  Chest x-rays dated 03/05/2018 and 12/26/2017 and chest CT dated 03/05/2018 FINDINGS: Heart size and pulmonary vascularity are normal. Aortic atherosclerosis. The patient has severe diffuse findings of pulmonary fibrosis and bronchiectasis as demonstrated on the prior CT scan. There is new volume loss at the right lung base and there is increased peripheral density in the left upper lobe which may represent acute inflammation superimposed on the chronic lung disease. No acute bone abnormality. Multiple surgical clips in the left side of the neck. IMPRESSION: 1. Severe chronic lung disease with pulmonary fibrosis and bronchiectasis. 2. New volume loss at the right lung base. 3. New peripheral density in the left upper lung zone which may represent acute inflammation/infection superimposed on chronic lung disease. 4.  Aortic Atherosclerosis (ICD10-I70.0). Electronically Signed   By: Lorriane Shire M.D.   On: 06/04/2018 12:22    EKG: Orders placed or performed during the hospital encounter of  05/28/2018  . ED EKG  . ED EKG    IMPRESSION AND PLAN:  85 year old elderly male patient with history of atrial fibrillation not on anticoagulation with history of GI bleed, squamous cell cancer of left ear, diabetes mellitus type 2, hypertension, pulmonary fibrosis, coronary disease presented to emergency room for passing out  -Syncope Admit patient to telemetry Serial cardiac enzymes Check orthostatic vitals Echocardiogram done in November 2019 showed EF of 55 to 60% Cardiology consult  -Chronic atrial fibrillation Rate control meds No anticoagulation secondary to history of GI bleed  -Gait instability Physical therapy evaluation for gait and balance training  -Type 2 diabetes mellitus Diabetic diet with sliding scale coverage with insulin  -DVT prophylaxis subcu Lovenox daily  -Squamous cell cancer of the left frontal area Outpatient oncology follow-up  All the records are reviewed and case discussed with ED provider. Management plans discussed with the patient, family and they are in agreement.  CODE STATUS:DNR    Code Status Orders  (From admission, onward)         Start     Ordered   06/19/2018 0957  Do not attempt resuscitation (DNR)  Continuous    Question Answer Comment  In the event of cardiac or respiratory ARREST Do not call a "code blue"   In the event of cardiac or respiratory ARREST Do not perform Intubation, CPR, defibrillation or ACLS   In the event of cardiac or respiratory  ARREST Use medication by any route, position, wound care, and other measures to relive pain and suffering. May use oxygen, suction and manual treatment of airway obstruction as needed for comfort.      06/04/2018 0956        Code Status History    Date Active Date Inactive Code Status Order ID Comments User Context   03/06/2018 0245 03/08/2018 1943 Full Code [id number]  Arta Silence, MD Inpatient   06/26/2016 2116 07/05/2016 2239 Full Code [id number]  Hillary Bow, MD ED        TOTAL TIME TAKING CARE OF THIS PATIENT: 52 minutes.    Saundra Shelling M.D on 06/21/2018 at 11:46 AM  Between 7am to 6pm - Pager - [phone]  After 6pm go to [email or web address] - password [email or web address] [facility]  [facility] [facility]  Office  [phone]  CC: Primary care physician; Adin Hector, MD

## 2018-06-05 NOTE — Progress Notes (Signed)
PT Cancellation Note  Patient Details Name: Glenn Reeves MRN: [id number] DOB: [birth year]   Cancelled Treatment:    Reason Eval/Treat Not Completed: Patient not medically ready(patient scheduled to have xrays of B hips to r/o fractures ) will re-attempt tomorrow when/if appropriate.    Derron Pipkins 06/17/2018, 12:50 PM

## 2018-06-05 NOTE — Care Management Note (Signed)
Case Management Note  Patient Details  Name: Glenn Reeves MRN: [id number] Date of Birth: [birth year]  Subjective/Objective:     Patient is being seen in the Emergency room for loss of consciousness.  Patient came to the ED yesterday after a fall and was discharged home.  Daughter reports that this morning her father was on the toilet and tried to get up and passed out.  Patient lives with daughter [name] and her husband.  [name] is the primary care giver though sometimes she does have sitters come in.  Last night a CNA that works at the hospital came and stayed the night with the patient until 6 am.  Daughter reports that patient was very restless throughout the night.  Patient has a walker and wheelchair in the home.  Patient has been to rehab in the past at [facility].  Daughter would like for patient to be able to go to rehab, she only wants [facility].  MOON letter reviewed and daughter understands that observation days do not count toward inpatient days and Medicare requires a minimum 3 night inpatient stay.  Patient will be placed in observation.  Patient wears oxygen at home supply company is [place].  Patient has been followed in the past by outpatient palliative and hospice services were offered.  Hospice was declined by the patient and family when offered in January of this year.  Daughter reports that patient is no longer followed by palliative.   RNCM will cont to follow and LCSW is following as well. Doran Clay RN BSN [phone]                 Action/Plan:   Expected Discharge Date:                  Expected Discharge Plan:     In-House Referral:     Discharge planning Services     Post Acute Care Choice:    Choice offered to:     DME Arranged:    DME Agency:     HH Arranged:    HH Agency:     Status of Service:     If discussed at [place] of Stay Meetings, dates discussed:    Additional Comments:  Shelbie Hutching, RN 06/19/2018, 9:59  AM

## 2018-06-05 NOTE — ED Provider Notes (Signed)
[facility] Emergency Department Provider Note    First MD Initiated Contact with Patient 06/15/2018 0825     (approximate)  I have reviewed the triage vital signs and the nursing notes.   HISTORY  Chief Complaint Loss of Consciousness    HPI Glenn Reeves is a 85 y.o. male below listed past medical history presents to the ER after near syncopal episode.  Patient had a diarrheal episode while he was standing and then had syncopal episode where he was helped down on the commode by his daughter.  Otherwise feels well at this point.  Patient sent to the ER for discussion of placement and need for rehab is the family can no longer take care of him.  Denies any pain or worsening shortness of breath at this time.    Past Medical History:  Diagnosis Date  . Atrial fibrillation ([place])   . CAD (coronary artery disease)   . Cancer (HCC)    squamous cell - left ear  . Diabetes mellitus without complication ([place])   . GI bleed   . Hypertension   . Pulmonary fibrosis ([place])   . Thyroid disease    Family History  Problem Relation Age of Onset  . Heart attack Mother   . Heart attack Father   . Heart disease Brother   . Stroke Brother   . Autoimmune disease Neg Hx    Past Surgical History:  Procedure Laterality Date  . ESOPHAGOGASTRODUODENOSCOPY (EGD) WITH PROPOFOL N/A 06/29/2016   Procedure: ESOPHAGOGASTRODUODENOSCOPY (EGD) WITH PROPOFOL;  Surgeon: [name], MD;  Location: [facility] ENDOSCOPY;  Service: Endoscopy;  Laterality: N/A;   Patient Active Problem List   Diagnosis Date Noted  . Cellulitis of left elbow 03/17/2018  . Weight loss 03/17/2018  . Acute hypoxemic respiratory failure ([place]) 03/06/2018  . Pressure injury of skin 03/06/2018  . Protein-calorie malnutrition, severe 03/06/2018  . Squamous cell carcinoma of skin 01/02/2018  . Acquired hypothyroidism 08/03/2016  . Atrial fibrillation and flutter ([place]) 08/03/2016  . CKD (chronic kidney  disease) stage 3, GFR 30-59 ml/min (HCC) 08/03/2016  . Controlled type 2 diabetes mellitus with complication, without long-term current use of insulin ([place]) 08/03/2016  . Essential hypertension 08/03/2016  . MCI (mild cognitive impairment) with memory loss 08/03/2016  . Peripheral vascular disease ([place]) 08/03/2016  . Senile purpura ([place]) 08/03/2016  . Spinal stenosis of lumbar region without neurogenic claudication 08/03/2016  . Thoracic aortic aneurysm without rupture ([place]) 08/03/2016  . Pulmonary fibrosis ([place]) 08/02/2016  . HCAP (healthcare-associated pneumonia)   . Other fatigue   . Palliative care by specialist   . Goals of care, counseling/discussion   . Blood in stool   . Chronic duodenal ulcer with hemorrhage   . Gastritis without bleeding   . Sepsis ([place]) 06/26/2016  . Chronic obstructive pulmonary disease ([place]) 05/21/2016  . COPD (chronic obstructive pulmonary disease) ([place]) 02/28/2016  . OSA (obstructive sleep apnea) 02/28/2016      Prior to Admission medications   Medication Sig Start Date End Date Taking? Authorizing Provider  carvedilol (COREG) 6.25 MG tablet Take 6.25 mg by mouth 2 (two) times daily. 06/13/16   [provider]  cephALEXin (KEFLEX) 500 MG capsule Take 1 capsule (500 mg total) by mouth 2 (two) times daily. 03/15/18   Earlie Server, MD  dronabinol (MARINOL) 5 MG capsule Take 1 capsule (5 mg total) by mouth 2 (two) times daily before a meal. 03/15/18   Earlie Server, MD  finasteride [facility])  5 MG tablet Take 5 mg by mouth daily. 06/07/16   [provider]  furosemide (LASIX) 20 MG tablet Take 20 mg by mouth every other day.     [provider]  HUMALOG KWIKPEN 100 UNIT/ML KiwkPen Inject 12 Units into the skin See admin instructions. Inject 12u under the skin with meals as directed for blood sugar over 150    [provider]  ipratropium-albuterol (DUONEB) 0.5-2.5 (3) MG/3ML SOLN Inhale 3 mLs into the lungs every evening.      [provider]  LEVEMIR FLEXTOUCH 100 UNIT/ML Pen Inject 5 Units into the skin at bedtime.     [provider]  levothyroxine (SYNTHROID, LEVOTHROID) 75 MCG tablet Take 75 mcg by mouth daily. 06/07/16   [provider]  montelukast (SINGULAIR) 10 MG tablet Take 1 tablet by mouth daily. 06/07/16   [provider]  pantoprazole (PROTONIX) 40 MG tablet Take 40 mg by mouth daily.    [provider]  polycarbophil (FIBERCON) 625 MG tablet Take 625 mg by mouth daily.     [provider]  rosuvastatin (CRESTOR) 20 MG tablet Take 10 mg by mouth every evening.     [provider]  tamsulosin (FLOMAX) 0.4 MG CAPS capsule Take 0.4 mg by mouth daily.     [provider]    Allergies Codeine    Social History Social History   Tobacco Use  . Smoking status: Former Smoker    Types: Cigarettes    Last attempt to quit: 01/03/1988    Years since quitting: 30.4  . Smokeless tobacco: Never Used  . Tobacco comment: smoked in the winter  Substance Use Topics  . Alcohol use: No  . Drug use: No    Review of Systems Patient denies headaches, rhinorrhea, blurry vision, numbness, shortness of breath, chest pain, edema, cough, abdominal pain, nausea, vomiting, diarrhea, dysuria, fevers, rashes or hallucinations unless otherwise stated above in HPI. ____________________________________________   PHYSICAL EXAM:  VITAL SIGNS: Vitals:   06/11/2018 0830  BP: 120/73  Pulse: 87  Resp: (!) 29  Temp: (!) 97.5 F (36.4 C)  SpO2: 95%    Constitutional: Alert and oriented.  Eyes: Conjunctivae are normal.  Head: multiple bruises noted across forehead with laceration to right eyebrow from yesterady well healing Nose: No congestion/rhinnorhea. Mouth/Throat: Mucous membranes are moist.   Neck: No stridor. Painless ROM.  Cardiovascular: Normal rate, irregularly irregular rhythm. Grossly normal heart sounds.  Good peripheral  circulation. Respiratory: mild tachypnea reportedly at baseline.  No retractions. Lungs CTAB. Gastrointestinal: Soft and nontender. No distention. No abdominal bruits. No CVA tenderness. Genitourinary:  Musculoskeletal: No lower extremity tenderness nor edema.  No joint effusions. Neurologic:  Normal speech and language. No gross focal neurologic deficits are appreciated. No facial droop Skin:  Skin is warm, dry and intact. No rash noted. Psychiatric: Mood and affect are normal. Speech and behavior are normal.  ____________________________________________   LABS (all labs ordered are listed, but only abnormal results are displayed)  Results for orders placed or performed during the hospital encounter of 06/04/18 (from the past 24 hour(s))  CBC with Differential     Status: Abnormal   Collection Time: 06/04/18 11:19 AM  Result Value Ref Range   WBC 7.9 4.0 - 10.5 K/uL   RBC 4.31 4.22 - 5.81 MIL/uL   Hemoglobin 11.9 (L) 13.0 - 17.0 g/dL   HCT 39.1 39.0 - 52.0 %   MCV 90.7 80.0 - 100.0 fL  MCH 27.6 26.0 - 34.0 pg   MCHC 30.4 30.0 - 36.0 g/dL   RDW 17.2 (H) 11.5 - 15.5 %   Platelets 148 (L) 150 - 400 K/uL   nRBC 0.0 0.0 - 0.2 %   Neutrophils Relative % 45 %   Neutro Abs 3.6 1.7 - 7.7 K/uL   Lymphocytes Relative 40 %   Lymphs Abs 3.1 0.7 - 4.0 K/uL   Monocytes Relative 8 %   Monocytes Absolute 0.6 0.1 - 1.0 K/uL   Eosinophils Relative 6 %   Eosinophils Absolute 0.4 0.0 - 0.5 K/uL   Basophils Relative 1 %   Basophils Absolute 0.0 0.0 - 0.1 K/uL   Immature Granulocytes 0 %   Abs Immature Granulocytes 0.02 0.00 - 0.07 K/uL  Comprehensive metabolic panel     Status: Abnormal   Collection Time: 06/04/18 11:19 AM  Result Value Ref Range   Sodium 141 135 - 145 mmol/L   Potassium 4.7 3.5 - 5.1 mmol/L   Chloride 106 98 - 111 mmol/L   CO2 32 22 - 32 mmol/L   Glucose, Bld 107 (H) 70 - 99 mg/dL   BUN 28 (H) 8 - 23 mg/dL   Creatinine, Ser 1.30 (H) 0.61 - 1.24 mg/dL   Calcium 8.4 (L)  8.9 - 10.3 mg/dL   Total Protein 7.5 6.5 - 8.1 g/dL   Albumin 2.5 (L) 3.5 - 5.0 g/dL   AST 27 15 - 41 U/L   ALT 18 0 - 44 U/L   Alkaline Phosphatase 61 38 - 126 U/L   Total Bilirubin 0.8 0.3 - 1.2 mg/dL   GFR calc non Af Amer 50 (L) >60 mL/min   GFR calc Af Amer 58 (L) >60 mL/min   Anion gap 3 (L) 5 - 15  Troponin I - ONCE - STAT     Status: None   Collection Time: 06/04/18 11:19 AM  Result Value Ref Range   Troponin I <0.03 <0.03 ng/mL  Troponin I - ONCE - STAT     Status: None   Collection Time: 06/04/18  2:10 PM  Result Value Ref Range   Troponin I <0.03 <0.03 ng/mL   ____________________________________________  EKG My review and personal interpretation at Time: 8:30   Indication: syncope  Rate: 90  Rhythm: afib Axis: left Other: nonspecific st abn, no stemi, abnml ekg unchnaged from yesterday ____________________________________________  RADIOLOGY  I personally reviewed all radiographic images ordered to evaluate for the above acute complaints and reviewed radiology reports and findings.  These findings were personally discussed with the patient.  Please see medical record for radiology report.  ____________________________________________   PROCEDURES  Procedure(s) performed:  Procedures    Critical Care performed: no ____________________________________________   INITIAL IMPRESSION / ASSESSMENT AND PLAN / ED COURSE  Pertinent labs & imaging results that were available during my care of the patient were reviewed by me and considered in my medical decision making (see chart for details).   DDX: dehydration, vasovagal, anemia, dysrhythmia  Hudsyn Champine is a 85 y.o. who presents to the ED with second syncopal episode.  Patient with multiple comorbidities.  Blood work relatively stable.  Will give gentle IV hydration.  Troponin negative.  EKG is unchanged from previous but given his comorbidities and second syncopal episode do feel patient would benefit  from our admission to the hospital for syncopal evaluation.      As part of my medical decision making, I reviewed the following data within the electronic  MEDICAL RECORD NUMBER Nursing notes reviewed and incorporated, Labs reviewed, notes from prior ED visits and [place] Controlled Substance Database   ____________________________________________   FINAL CLINICAL IMPRESSION(S) / ED DIAGNOSES  Final diagnoses:  Syncope and collapse      NEW MEDICATIONS STARTED DURING THIS VISIT:  New Prescriptions   No medications on file     Note:  This document was prepared using Dragon voice recognition software and may include unintentional dictation errors.    Merlyn Lot, MD 06/13/2018 [phone]

## 2018-06-05 NOTE — ED Notes (Signed)
ED TO INPATIENT HANDOFF REPORT  Name/Age/Gender Glenn Reeves 85 y.o. male  Code Status Code Status History    Date Active Date Inactive Code Status Order ID Comments User Context   03/06/2018 0245 03/08/2018 1943 Full Code [id number]  Arta Silence, MD Inpatient   06/26/2016 2116 07/05/2016 2239 Full Code [id number]  Hillary Bow, MD ED      Home/SNF/Other Rehab  Chief Complaint syncopal episode  Level of Care/Admitting Diagnosis ED Disposition    ED Disposition Condition [facility]: [place] [[id number]]  Level of Care: Telemetry [5]  Diagnosis: Syncope and collapse [780.2.ICD-9-CM]  Admitting Physician: Saundra Shelling [[id number]]  Attending Physician: Saundra Shelling [[id number]]  PT Class (Do Not Modify): Observation [104]  PT Acc Code (Do Not Modify): Observation [10022]       Medical History Past Medical History:  Diagnosis Date  . Atrial fibrillation ([place])   . CAD (coronary artery disease)   . Cancer (HCC)    squamous cell - left ear  . Diabetes mellitus without complication ([place])   . GI bleed   . Hypertension   . Pulmonary fibrosis ([place])   . Thyroid disease     Allergies Allergies  Allergen Reactions  . Codeine Rash    IV Location/Drains/Wounds Patient Lines/Drains/Airways Status   Active Line/Drains/Airways    Name:   Placement date:   Placement time:   Site:   Days:   Peripheral IV 06/16/2018 Left Antecubital   06/08/2018    0839    Antecubital   less than 1   Pressure Injury Stage II -  Partial thickness loss of dermis presenting as a shallow open ulcer with a red, pink wound bed without slough.   -    -               Labs/Imaging Results for orders placed or performed during the hospital encounter of 06/13/2018 (from the past 48 hour(s))  CBC with Differential/Platelet     Status: Abnormal   Collection Time: 05/29/2018  8:33 AM  Result Value Ref Range   WBC 9.4 4.0 - 10.5 K/uL   RBC 3.94 (L) 4.22 -  5.81 MIL/uL   Hemoglobin 11.1 (L) 13.0 - 17.0 g/dL   HCT 36.2 (L) 39.0 - 52.0 %   MCV 91.9 80.0 - 100.0 fL   MCH 28.2 26.0 - 34.0 pg   MCHC 30.7 30.0 - 36.0 g/dL   RDW 17.1 (H) 11.5 - 15.5 %   Platelets 149 (L) 150 - 400 K/uL   nRBC 0.0 0.0 - 0.2 %   Neutrophils Relative % 60 %   Neutro Abs 5.5 1.7 - 7.7 K/uL   Lymphocytes Relative 28 %   Lymphs Abs 2.7 0.7 - 4.0 K/uL   Monocytes Relative 9 %   Monocytes Absolute 0.8 0.1 - 1.0 K/uL   Eosinophils Relative 3 %   Eosinophils Absolute 0.3 0.0 - 0.5 K/uL   Basophils Relative 0 %   Basophils Absolute 0.0 0.0 - 0.1 K/uL   Immature Granulocytes 0 %   Abs Immature Granulocytes 0.03 0.00 - 0.07 K/uL    Comment: Performed at [facility], [address] [place], [place] [address]  Comprehensive metabolic panel     Status: Abnormal   Collection Time: 06/13/2018  8:33 AM  Result Value Ref Range   Sodium 140 135 - 145 mmol/L   Potassium 4.4 3.5 - 5.1 mmol/L   Chloride 105 98 - 111  mmol/L   CO2 31 22 - 32 mmol/L   Glucose, Bld 130 (H) 70 - 99 mg/dL   BUN 28 (H) 8 - 23 mg/dL   Creatinine, Ser 1.25 (H) 0.61 - 1.24 mg/dL   Calcium 8.3 (L) 8.9 - 10.3 mg/dL   Total Protein 7.2 6.5 - 8.1 g/dL   Albumin 2.4 (L) 3.5 - 5.0 g/dL   AST 34 15 - 41 U/L   ALT 20 0 - 44 U/L   Alkaline Phosphatase 60 38 - 126 U/L   Total Bilirubin 0.9 0.3 - 1.2 mg/dL   GFR calc non Af Amer 52 (L) >60 mL/min   GFR calc Af Amer >60 >60 mL/min   Anion gap 4 (L) 5 - 15    Comment: Performed at [facility], [address] [place], [place] [address]  Troponin I - ONCE - STAT     Status: None   Collection Time: 05/31/2018  8:33 AM  Result Value Ref Range   Troponin I <0.03 <0.03 ng/mL    Comment: Performed at [facility], [place], [place] [address]   Ct Head Wo Contrast  Result Date: 06/04/2018 CLINICAL DATA:  Neck pain and scalp hematoma after unwitnessed fall. EXAM: CT HEAD WITHOUT CONTRAST CT CERVICAL SPINE WITHOUT  CONTRAST TECHNIQUE: Multidetector CT imaging of the head and cervical spine was performed following the standard protocol without intravenous contrast. Multiplanar CT image reconstructions of the cervical spine were also generated. COMPARISON:  CT scan of May 14, 2018. FINDINGS: CT HEAD FINDINGS Brain: Mild diffuse cortical atrophy is noted. Mild chronic ischemic white matter disease is noted. Old lacunar infarction is noted in left basal ganglia. No midline shift is noted. Ventricular size is within normal limits. There is no evidence of hemorrhage or acute infarction. Stable appearance of destructive mass seen in the left frontal skull region. Vascular: No hyperdense vessel or unexpected calcification. 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 This extends into the left lateral orbital wall and left zygomatic arch. No acute traumatic fracture is noted. Sinuses/Orbits: Left ethmoid sinusitis is noted. Other: Fluid is noted in right mastoid air cells. CT CERVICAL SPINE FINDINGS Alignment: Normal. Skull base and vertebrae: No acute fracture. No primary bone lesion or focal pathologic process. Soft tissues and spinal canal: No prevertebral fluid or swelling. No visible canal hematoma. Disc levels: Moderate degenerative disc disease is noted at C4-5 and C6-7 with anterior osteophyte formation. Severe degenerative disc disease is noted at C5-6. Upper chest: Extensive amount of scarring and pleural thickening is noted in both lung apices. Other: Degenerative changes are seen involving posterior facet joints bilaterally. IMPRESSION: Mild diffuse cortical atrophy. Mild chronic ischemic white matter disease. 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 No other significant intracranial abnormality seen. Left ethmoid sinusitis. Multilevel degenerative disc disease. No acute abnormality seen in the cervical spine. Electronically Signed   By: Marijo Conception, M.D.   On: 06/04/2018 12:31   Ct Cervical Spine Wo Contrast  Result Date: 06/04/2018 CLINICAL DATA:  Neck pain and scalp hematoma after unwitnessed fall. EXAM: CT HEAD WITHOUT CONTRAST CT CERVICAL SPINE WITHOUT CONTRAST TECHNIQUE: Multidetector CT imaging of the head and cervical spine was performed following the standard protocol without intravenous contrast. Multiplanar CT image reconstructions of the cervical spine were also generated. COMPARISON:  CT scan of May 14, 2018. FINDINGS: CT HEAD FINDINGS Brain: Mild diffuse cortical atrophy is noted. Mild  chronic ischemic white matter disease is noted. Old lacunar infarction is noted in left basal ganglia. No midline shift is noted. Ventricular size is within normal limits. There is no evidence of hemorrhage or acute infarction. Stable appearance of destructive mass seen in the left frontal skull region. Vascular: No hyperdense vessel or unexpected calcification. 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 This extends into the left lateral orbital wall and left zygomatic arch. No acute traumatic fracture is noted. Sinuses/Orbits: Left ethmoid sinusitis is noted. Other: Fluid is noted in right mastoid air cells. CT CERVICAL SPINE FINDINGS Alignment: Normal. Skull base and vertebrae: No acute fracture. No primary bone lesion or focal pathologic process. Soft tissues and spinal canal: No prevertebral fluid or swelling. No visible canal hematoma. Disc levels: Moderate degenerative disc disease is noted at C4-5 and C6-7 with anterior osteophyte formation. Severe degenerative disc disease is noted at C5-6. Upper chest: Extensive amount of scarring and pleural thickening is noted in both lung apices. Other: Degenerative changes are seen involving posterior facet joints bilaterally. IMPRESSION: Mild diffuse cortical atrophy. Mild chronic ischemic white matter disease. Stable appearance of large destructive mass lesion  involving left frontal skull. No other significant intracranial abnormality seen. Left ethmoid sinusitis. Multilevel degenerative disc disease. No acute abnormality seen in the cervical spine. Electronically Signed   By: Marijo Conception, M.D.   On: 06/04/2018 12:31   Dg Chest Portable 1 View  Result Date: 06/04/2018 CLINICAL DATA:  Unwitnessed fall today. EXAM: PORTABLE CHEST 1 VIEW COMPARISON:  Chest x-rays dated 03/05/2018 and 12/26/2017 and chest CT dated 03/05/2018 FINDINGS: Heart size and pulmonary vascularity are normal. Aortic atherosclerosis. The patient has severe diffuse findings of pulmonary fibrosis and bronchiectasis as demonstrated on the prior CT scan. There is new volume loss at the right lung base and there is increased peripheral density in the left upper lobe which may represent acute inflammation superimposed on the chronic lung disease. No acute bone abnormality. Multiple surgical clips in the left side of the neck. IMPRESSION: 1. Severe chronic lung disease with pulmonary fibrosis and bronchiectasis. 2. New volume loss at the right lung base. 3. New peripheral density in the left upper lung zone which may represent acute inflammation/infection superimposed on chronic lung disease. 4.  Aortic Atherosclerosis (ICD10-I70.0). Electronically Signed   By: Lorriane Shire M.D.   On: 06/04/2018 12:22    Pending Labs [employer] (From admission, onward)    Start     Ordered   Signed and Held  CBC  (enoxaparin (LOVENOX)    CrCl >/= 30 ml/min)  Once,   R    Comments:  Baseline for enoxaparin therapy IF NOT ALREADY DRAWN.  Notify MD if PLT < 100 K.    Signed and Held   Signed and Held  Creatinine, serum  (enoxaparin (LOVENOX)    CrCl >/= 30 ml/min)  Once,   R    Comments:  Baseline for enoxaparin therapy IF NOT ALREADY DRAWN.    Signed and Held   Signed and Held  Creatinine, serum  (enoxaparin (LOVENOX)    CrCl >/= 30 ml/min)  Weekly,   R    Comments:  while on enoxaparin therapy     Signed and Held   Signed and Held  Troponin I - Now Then Q6H  Now then every 6 hours,   R     Signed and Held   Signed and Held  Basic metabolic panel  Tomorrow morning,  R     Signed and Held   Signed and Held  CBC  Tomorrow morning,   R     Signed and Held          Vitals/Pain Today's Vitals   06/04/2018 0830 06/19/2018 0831 06/14/2018 0900 06/06/2018 0917  BP: 120/73  118/61   Pulse: 87  95   Resp: (!) 29  (!) 21   Temp: (!) 97.5 F (36.4 C)     TempSrc: Oral     SpO2: 95%  95% 100%  Weight:  71.7 kg    Height:  5\' 9"  (1.753 m)    PainSc:  0-No pain      Isolation Precautions No active isolations  Medications Medications  sodium chloride 0.9 % bolus 500 mL (0 mLs Intravenous Stopped 06/20/2018 0918)    Mobility

## 2018-06-05 NOTE — Progress Notes (Signed)
Notify Dr. Anselm Jungling about patient's blood sugar of 88, patient has scheduled Levimir 5 units, order to hold. RN will continue to monitor.

## 2018-06-05 NOTE — Progress Notes (Signed)
Talked to Dr. Anselm Jungling about patient's BP of 92/50, HR 68 is scheduled to received coreg p.o, order to hold. Also uses CPAP at home, ordered given. RN will continue to monitor.

## 2018-06-05 NOTE — ED Notes (Signed)
Room air SpO2 97%. Patients daughter requesting patient be placed on oxygen. She reports patient will "sometimes" wear 2L at home. Patient placed on 2L [place] per her request.

## 2018-06-05 NOTE — Progress Notes (Signed)
Advanced care plan. Purpose of the Encounter: CODE STATUS Parties in Attendance: Patient and family Patient's Decision Capacity: Okay Subjective/Patient's story: Presented to emergency room for fall and passing out Objective/Medical story Patient needs work-up and evaluation for syncope Appears dry and dehydrated needs IV fluids Goals of care determination:  Advance care directives goals of care and treatment plan discussed .  Patient and family do not want CPR, intubation ventilator if the need arises CODE STATUS: DNR Time spent discussing advanced care planning: 16 minutes

## 2018-06-05 NOTE — ED Triage Notes (Signed)
Patient presents to ED via [facility] post syncopal event. Patient was seen yesterday post fall. Trauma noted to right eye, left temple region and left wrist. EMS report workup yesterday was negative and patient was discharged home. This morning patient was in the restroom with his daughter when he had a syncopal event post bowel movement. Daughter assisted patient down onto toilet. [place] on arrival. Patient reports shortness of breath.

## 2018-06-05 NOTE — Progress Notes (Signed)
MD notified. Pt is complaining of left hip pain. MD will enter orders. I will continue to assess.

## 2018-06-05 NOTE — Consult Note (Addendum)
[facility] Cardiology  CARDIOLOGY CONSULT NOTE  Patient ID: Glenn Reeves MRN: [id number] DOB/AGE: 06/18/1930 85 y.o.  Admit date: 06/02/2018 Referring Physician Pyreddy Primary Physician Ramonita Lab, MD Primary Cardiologist Fath Reason for Consultation syncope  HPI: 85 year old male referred for evaluation of syncope.  The patient has a history of paroxysmal atrial fibrillation, not on chronic anticoagulation due to GI bleed, pulmonary fibrosis, chronic respiratory failure, bradycardia, hypertension, type 2 diabetes, chronic kidney disease stage 3, no known history of coronary artery disease, history of carotid endarterectomy, and metastatic squamous cell carcinoma, with radiation therapy.  According to a recent visit with his primary care provider last week, the patient's overall health continues to decline, and palliative care is involved.  The patient presented to [facility] ER yesterday morning after sustaining an unwitnessed fall in the bathroom and lacerated his scalp.  Once stabilized, he was discharged home.  The patient return to [facility] ER this morning for recurrent possible syncopal episode. This history was obtained from the daughter, with whom the patient lives, and who was present during the episode. The patient had just finished urinating, his daughter helped clean him up, and she turned her back to him, only for the patient to fall forward onto her. She pushed him back towards the toilet so he could be sat down. She reports that his hand was shaking, his skin was pale, and he had a blank stare with drooping of his body to the side for about 3 minutes. She states this has happened on every witnessed occasion of what was felt to be syncope- his hand shakes and he has a blank stare without verbal communication or movement for about 3 minutes. During this admission, labs are notable for initial troponin less than 0.03, followed by 0.03.  ECG revealed atrial fibrillation at a rate of 103 bpm with right  bundle branch block and left anterior fascicular block.  2D echocardiogram 03/06/2018 during an admission for syncope revealed normal ventricular function with LVEF 55 to 60%.   Review of systems complete and found to be negative unless listed above     Past Medical History:  Diagnosis Date  . Atrial fibrillation ([place])   . CAD (coronary artery disease)   . Cancer (HCC)    squamous cell - left ear  . Diabetes mellitus without complication ([place])   . GI bleed   . Hypertension   . Pulmonary fibrosis ([place])   . Thyroid disease     Past Surgical History:  Procedure Laterality Date  . ESOPHAGOGASTRODUODENOSCOPY (EGD) WITH PROPOFOL N/A 06/29/2016   Procedure: ESOPHAGOGASTRODUODENOSCOPY (EGD) WITH PROPOFOL;  Surgeon: [name], MD;  Location: [facility] ENDOSCOPY;  Service: Endoscopy;  Laterality: N/A;    Medications Prior to Admission  Medication Sig Dispense Refill Last Dose  . carvedilol (COREG) 6.25 MG tablet Take 6.25 mg by mouth 2 (two) times daily.   Taking  . cephALEXin (KEFLEX) 500 MG capsule Take 1 capsule (500 mg total) by mouth 2 (two) times daily. 14 capsule 0 Taking  . dronabinol (MARINOL) 5 MG capsule Take 1 capsule (5 mg total) by mouth 2 (two) times daily before a meal. 60 capsule 0 Taking  . finasteride (PROSCAR) 5 MG tablet Take 5 mg by mouth daily.   Taking  . furosemide (LASIX) 20 MG tablet Take 20 mg by mouth every other day.    Taking  . HUMALOG KWIKPEN 100 UNIT/ML KiwkPen Inject 12 Units into the skin See admin instructions. Inject 12u under the skin with meals  as directed for blood sugar over 150   Taking  . ipratropium-albuterol (DUONEB) 0.5-2.5 (3) MG/3ML SOLN Inhale 3 mLs into the lungs every evening.    Taking  . LEVEMIR FLEXTOUCH 100 UNIT/ML Pen Inject 5 Units into the skin at bedtime.    Taking  . levothyroxine (SYNTHROID, LEVOTHROID) 75 MCG tablet Take 75 mcg by mouth daily.   Taking  . montelukast (SINGULAIR) 10 MG tablet Take 1 tablet by mouth daily.   Taking  .  pantoprazole (PROTONIX) 40 MG tablet Take 40 mg by mouth daily.   Taking  . polycarbophil (FIBERCON) 625 MG tablet Take 625 mg by mouth daily.    Taking  . rosuvastatin (CRESTOR) 20 MG tablet Take 10 mg by mouth every evening.    Taking  . tamsulosin (FLOMAX) 0.4 MG CAPS capsule Take 0.4 mg by mouth daily.    Taking   Social History   Socioeconomic History  . Marital status: Widowed    Spouse name: Not on file  . Number of children: Not on file  . Years of education: Not on file  . Highest education level: Not on file  Occupational History  . Not on file  Social Needs  . Financial resource strain: Patient refused  . Food insecurity:    Worry: Patient refused    Inability: Patient refused  . Transportation needs:    Medical: Patient refused    Non-medical: Patient refused  Tobacco Use  . Smoking status: Former Smoker    Types: Cigarettes    Last attempt to quit: 01/03/1988    Years since quitting: 30.4  . Smokeless tobacco: Never Used  . Tobacco comment: smoked in the winter  Substance and Sexual Activity  . Alcohol use: No  . Drug use: No  . Sexual activity: Not on file  Lifestyle  . Physical activity:    Days per week: Patient refused    Minutes per session: Patient refused  . Stress: Not on file  Relationships  . Social connections:    Talks on phone: Patient refused    Gets together: Patient refused    Attends religious service: Patient refused    Active member of club or organization: Patient refused    Attends meetings of clubs or organizations: Patient refused    Relationship status: Patient refused  . Intimate partner violence:    Fear of current or ex partner: Patient refused    Emotionally abused: Patient refused    Physically abused: Patient refused    Forced sexual activity: Patient refused  Other Topics Concern  . Not on file  Social History Narrative  . Not on file    Family History  Problem Relation Age of Onset  . Heart attack Mother   . Heart  attack Father   . Heart disease Brother   . Stroke Brother   . Autoimmune disease Neg Hx       Review of systems complete and found to be negative unless listed above      PHYSICAL EXAM  General: Frail elderly gentleman lying in bed in no acute distress HEENT: Bruising of face Neck:  No JVD.  Lungs: slight increased effort of breathing on supplemental oxygen Heart: irregularly irregular, tachycardic Abdomen: nondistended Extremities: No clubbing, cyanosis or edema.   Neuro: Alert Psych:  Good affect, responds appropriately  Labs:   Lab Results  Component Value Date   WBC 9.4 06/12/2018   HGB 11.1 (L) 05/25/2018   HCT 36.2 (L) 06/16/2018  MCV 91.9 06/09/2018   PLT 149 (L) 06/12/2018    Recent Labs  Lab 06/13/2018 0833  NA 140  K 4.4  CL 105  CO2 31  BUN 28*  CREATININE 1.25*  CALCIUM 8.3*  PROT 7.2  BILITOT 0.9  ALKPHOS 60  ALT 20  AST 34  GLUCOSE 130*   Lab Results  Component Value Date   CKTOTAL 23 (L) 03/05/2018   TROPONINI 0.03 (HH) 05/26/2018   No results found for: CHOL No results found for: HDL No results found for: LDLCALC No results found for: TRIG No results found for: CHOLHDL No results found for: LDLDIRECT    Radiology: Ct Head Wo Contrast  Result Date: 06/04/2018 CLINICAL DATA:  Neck pain and scalp hematoma after unwitnessed fall. EXAM: CT HEAD WITHOUT CONTRAST CT CERVICAL SPINE WITHOUT CONTRAST TECHNIQUE: Multidetector CT imaging of the head and cervical spine was performed following the standard protocol without intravenous contrast. Multiplanar CT image reconstructions of the cervical spine were also generated. COMPARISON:  CT scan of May 14, 2018. FINDINGS: CT HEAD FINDINGS Brain: Mild diffuse cortical atrophy is noted. Mild chronic ischemic white matter disease is noted. Old lacunar infarction is noted in left basal ganglia. No midline shift is noted. Ventricular size is within normal limits. There is no evidence of hemorrhage or  acute infarction. Stable appearance of destructive mass seen in the left frontal skull region. Vascular: No hyperdense vessel or unexpected calcification. 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 This extends into the left lateral orbital wall and left zygomatic arch. No acute traumatic fracture is noted. Sinuses/Orbits: Left ethmoid sinusitis is noted. Other: Fluid is noted in right mastoid air cells. CT CERVICAL SPINE FINDINGS Alignment: Normal. Skull base and vertebrae: No acute fracture. No primary bone lesion or focal pathologic process. Soft tissues and spinal canal: No prevertebral fluid or swelling. No visible canal hematoma. Disc levels: Moderate degenerative disc disease is noted at C4-5 and C6-7 with anterior osteophyte formation. Severe degenerative disc disease is noted at C5-6. Upper chest: Extensive amount of scarring and pleural thickening is noted in both lung apices. Other: Degenerative changes are seen involving posterior facet joints bilaterally. IMPRESSION: Mild diffuse cortical atrophy. Mild chronic ischemic white matter disease. 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 No other significant intracranial abnormality seen. Left ethmoid sinusitis. Multilevel degenerative disc disease. No acute abnormality seen in the cervical spine. Electronically Signed   By: Marijo Conception, M.D.   On: 06/04/2018 12:31   Ct Head Wo Contrast  Result Date: 05/14/2018 CLINICAL DATA:  85 year old male with mental status changes at night. Undergoing radiation therapy for treatment of squamous cell carcinoma to bone. Subsequent encounter. EXAM: CT HEAD WITHOUT CONTRAST TECHNIQUE: Contiguous axial images were obtained from the base of the skull through the vertex without intravenous contrast. COMPARISON:  None. FINDINGS: Brain: No intracranial hemorrhage or CT evidence of large acute infarct. Chronic microvascular changes. Anterior left  frontal lobe encephalomalacia may reflect result of radiation therapy. Global atrophy. Decrease in size of destructive lesion involving the left frontal calvarium, supraorbital region and left orbit. Currently, maximal thickness 2 cm versus prior 2.3 cm. Less mass effect upon the anterior left frontal lobe. Vascular: Vascular calcifications without acute hyperdense vessel. Skull: Destructive lesion left frontal calvarium. Sinuses/Orbits: Once again noted is extension of tumor into the left orbit with inferior displacement left globe. Tumor abuts the globe and left superior rectus muscle. Invasion of the  left lacrimal gland. Destroyed margins of the left frontal sinus. Opacification mid to posterior ethmoid sinus air cells bilaterally. Left sphenoid sinus mucosal thickening. Other: Partial opacification right mastoid air cells. IMPRESSION: 1. No intracranial hemorrhage or CT evidence of large acute infarct. 2. Chronic microvascular changes. Anterior left frontal lobe encephalomalacia may reflect result of radiation therapy. 3. Global atrophy. 4. Decrease in size of destructive lesion involving the left frontal calvarium, supraorbital region and left orbit. Currently, maximal thickness of left frontal region tumor is 2 cm versus prior 2.3 cm. Less mass effect upon the anterior left frontal lobe. 5. Once again noted is extension of tumor into the left orbit with inferior displacement left globe. Tumor abuts the globe and left superior rectus muscle. Invasion of the left lacrimal gland. Destroyed margins of the left frontal sinus. 6. Opacification mid to posterior ethmoid sinus air cells bilaterally. Left sphenoid sinus mucosal thickening. Partial opacification inferior right mastoid air cells. Electronically Signed   By: Genia Del M.D.   On: 05/14/2018 17:11   Ct Cervical Spine Wo Contrast  Result Date: 06/04/2018 CLINICAL DATA:  Neck pain and scalp hematoma after unwitnessed fall. EXAM: CT HEAD WITHOUT  CONTRAST CT CERVICAL SPINE WITHOUT CONTRAST TECHNIQUE: Multidetector CT imaging of the head and cervical spine was performed following the standard protocol without intravenous contrast. Multiplanar CT image reconstructions of the cervical spine were also generated. COMPARISON:  CT scan of May 14, 2018. FINDINGS: CT HEAD FINDINGS Brain: Mild diffuse cortical atrophy is noted. Mild chronic ischemic white matter disease is noted. Old lacunar infarction is noted in left basal ganglia. No midline shift is noted. Ventricular size is within normal limits. There is no evidence of hemorrhage or acute infarction. Stable appearance of destructive mass seen in the left frontal skull region. Vascular: No hyperdense vessel or unexpected calcification. 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 This extends into the left lateral orbital wall and left zygomatic arch. No acute traumatic fracture is noted. Sinuses/Orbits: Left ethmoid sinusitis is noted. Other: Fluid is noted in right mastoid air cells. CT CERVICAL SPINE FINDINGS Alignment: Normal. Skull base and vertebrae: No acute fracture. No primary bone lesion or focal pathologic process. Soft tissues and spinal canal: No prevertebral fluid or swelling. No visible canal hematoma. Disc levels: Moderate degenerative disc disease is noted at C4-5 and C6-7 with anterior osteophyte formation. Severe degenerative disc disease is noted at C5-6. Upper chest: Extensive amount of scarring and pleural thickening is noted in both lung apices. Other: Degenerative changes are seen involving posterior facet joints bilaterally. IMPRESSION: Mild diffuse cortical atrophy. Mild chronic ischemic white matter disease. 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 No other significant intracranial abnormality seen. Left ethmoid sinusitis. Multilevel degenerative disc disease. No acute abnormality seen in the cervical spine.  Electronically Signed   By: Marijo Conception, M.D.   On: 06/04/2018 12:31   Dg Chest Portable 1 View  Result Date: 06/04/2018 CLINICAL DATA:  Unwitnessed fall today. EXAM: PORTABLE CHEST 1 VIEW COMPARISON:  Chest x-rays dated 03/05/2018 and 12/26/2017 and chest CT dated 03/05/2018 FINDINGS: Heart size and pulmonary vascularity are normal. Aortic atherosclerosis. The patient has severe diffuse findings of pulmonary fibrosis and bronchiectasis as demonstrated on the prior CT scan. There is new volume loss at the right lung base and there is increased peripheral density in the left upper lobe which may represent acute inflammation superimposed on the chronic lung disease. No acute  bone abnormality. Multiple surgical clips in the left side of the neck. IMPRESSION: 1. Severe chronic lung disease with pulmonary fibrosis and bronchiectasis. 2. New volume loss at the right lung base. 3. New peripheral density in the left upper lung zone which may represent acute inflammation/infection superimposed on chronic lung disease. 4.  Aortic Atherosclerosis (ICD10-I70.0). Electronically Signed   By: Lorriane Shire M.D.   On: 06/04/2018 12:22   Dg Hip Unilat With Pelvis 2-3 Views Left  Result Date: 06/14/2018 CLINICAL DATA:  Acute LEFT hip pain following fall. Initial encounter. EXAM: DG HIP (WITH OR WITHOUT PELVIS) 2-3V LEFT COMPARISON:  None. FINDINGS: There is no evidence of acute fracture, subluxation or dislocation. Mild degenerative changes in the hips noted. No suspicious focal bony lesions are identified. Vascular calcifications are identified. IMPRESSION: No acute bony abnormality. Electronically Signed   By: Margarette Canada M.D.   On: 06/20/2018 14:50    EKG: atrial fibrillation, rate 70s  ASSESSMENT AND PLAN:  1. Syncope, possible. The patient has had multiple episodes of what was felt to be syncope with normal echocardiogram and no evidence of arrhthymias on telemetry during previous admissions. The daughter  describes that the patient has shaking of his hand, followed by what sounds like a postictal phase for about 3 minutes. Head CT revealed stable appearance of large destructive mass lesion involving left frontal skull, with no other significant intracranial abnormality seen. 2. Paroxysmal atrial fibrillation, not on chronic anticoagulation due to history of GI bleed, rate controlled on carvedilol. 3. Pulmonary fibrosis/chronic respiratory failure 4. Metastatic squamous cell carcinoma with large mass of left frontal skull   Recommendations: 1. Defer repeating echocardiogram with recent one done in 02/2018 2. Carotid ultrasound pending 3. Continue to monitor on telemetry for heart block and arrhythmias as cause of episode 4. Recommend neurology consult with possible EEG for evaluation of seizure like activity   Signed: Clabe Seal PA-C 06/11/2018, 3:02 PM

## 2018-06-05 NOTE — Progress Notes (Addendum)
Clinical Education officer, museum (CSW) received SNF consult. Per chart patient will be placed in observation, which means medicare will not pay for SNF. CSW contacted [facility] and confirmed that patient is not eligible for the SNF waiver because he is not a [facility] patient. RN case manager aware of above.   [employer], LCSW [phone]

## 2018-06-06 ENCOUNTER — Other Ambulatory Visit: Payer: Medicare Other

## 2018-06-06 DIAGNOSIS — R55 Syncope and collapse: Secondary | ICD-10-CM

## 2018-06-06 LAB — GLUCOSE, CAPILLARY
Glucose-Capillary: 100 mg/dL — ABNORMAL HIGH (ref 70–99)
Glucose-Capillary: 103 mg/dL — ABNORMAL HIGH (ref 70–99)
Glucose-Capillary: 130 mg/dL — ABNORMAL HIGH (ref 70–99)
Glucose-Capillary: 75 mg/dL (ref 70–99)
Glucose-Capillary: 85 mg/dL (ref 70–99)
Glucose-Capillary: 92 mg/dL (ref 70–99)

## 2018-06-06 LAB — CBC
HCT: 34.2 % — ABNORMAL LOW (ref 39.0–52.0)
Hemoglobin: 10.3 g/dL — ABNORMAL LOW (ref 13.0–17.0)
MCH: 27.9 pg (ref 26.0–34.0)
MCHC: 30.1 g/dL (ref 30.0–36.0)
MCV: 92.7 fL (ref 80.0–100.0)
Platelets: 121 10*3/uL — ABNORMAL LOW (ref 150–400)
RBC: 3.69 MIL/uL — ABNORMAL LOW (ref 4.22–5.81)
RDW: 17.2 % — ABNORMAL HIGH (ref 11.5–15.5)
WBC: 8.3 10*3/uL (ref 4.0–10.5)
nRBC: 0 % (ref 0.0–0.2)

## 2018-06-06 LAB — BASIC METABOLIC PANEL
Anion gap: 5 (ref 5–15)
BUN: 35 mg/dL — ABNORMAL HIGH (ref 8–23)
CO2: 31 mmol/L (ref 22–32)
Calcium: 8.3 mg/dL — ABNORMAL LOW (ref 8.9–10.3)
Chloride: 108 mmol/L (ref 98–111)
Creatinine, Ser: 1.36 mg/dL — ABNORMAL HIGH (ref 0.61–1.24)
GFR calc Af Amer: 55 mL/min — ABNORMAL LOW (ref >60)
GFR calc non Af Amer: 47 mL/min — ABNORMAL LOW (ref >60)
GLUCOSE: 84 mg/dL (ref 70–99)
Potassium: 4.8 mmol/L (ref 3.5–5.1)
Sodium: 144 mmol/L (ref 135–145)

## 2018-06-06 MED ORDER — BACITRACIN ZINC 500 UNIT/GM EX OINT
TOPICAL_OINTMENT | Freq: Two times a day (BID) | CUTANEOUS | Status: DC
  Administered 2018-06-06: 16:00:00 via TOPICAL
  Administered 2018-06-07: 1 via TOPICAL
  Filled 2018-06-06: qty 0.9

## 2018-06-06 MED ORDER — ZOLPIDEM TARTRATE 5 MG PO TABS
5.0000 mg | ORAL_TABLET | Freq: Every evening | ORAL | Status: DC | PRN
  Administered 2018-06-06: 5 mg via ORAL
  Filled 2018-06-06: qty 1

## 2018-06-06 NOTE — Progress Notes (Signed)
[facility] Cardiology  SUBJECTIVE: Per the patient's daughter at bedside, the patient did not sleep well last night, was disoriented and confused. He currently denies chest pain or shortness of breath at rest.   Vitals:   06/15/2018 1955 06/02/2018 2021 06/06/18 0415 06/06/18 0750  BP: (!) 92/50  99/72 103/64  Pulse: 60  79 77  Resp: 20  18   Temp: 98.3 F (36.8 C)  98.3 F (36.8 C) (!) 97.5 F (36.4 C)  TempSrc: Oral  Oral Oral  SpO2: 100% 99% 96% 100%  Weight:   78 kg   Height:         Intake/Output Summary (Last 24 hours) at 06/06/2018 9449 Last data filed at 06/06/2018 0458 Gross per 24 hour  Intake 1597.81 ml  Output 200 ml  Net 1397.81 ml      PHYSICAL EXAM  General: Frail elderly gentleman with lying in bed in no acute distress HEENT:  Bruising around eye and forehead, protruding bone of forehead Neck:  No JVD.  Lungs: Decreased breath sounds, course breath sounds, no wheezing, normal effort of breathing on supplemental oxygen Heart: irregularly irregular. Normal S1 and S2 without gallops or murmurs.  Abdomen: nondistended Msk:  Muscle atrophy. Bruising, skin tears of arms Extremities: No clubbing, cyanosis or edema.   Neuro: Alert, oriented x 2 Psych:  Flat affect   LABS: Basic Metabolic Panel: Recent Labs    06/14/2018 0833 06/06/18 0346  NA 140 144  K 4.4 4.8  CL 105 108  CO2 31 31  GLUCOSE 130* 84  BUN 28* 35*  CREATININE 1.25* 1.36*  CALCIUM 8.3* 8.3*   Liver Function Tests: Recent Labs    06/04/18 1119 05/26/2018 0833  AST 27 34  ALT 18 20  ALKPHOS 61 60  BILITOT 0.8 0.9  PROT 7.5 7.2  ALBUMIN 2.5* 2.4*   No results for input(s): LIPASE, AMYLASE in the last 72 hours. CBC: Recent Labs    06/04/18 1119 06/06/2018 0833 06/06/18 0346  WBC 7.9 9.4 8.3  NEUTROABS 3.6 5.5  --   HGB 11.9* 11.1* 10.3*  HCT 39.1 36.2* 34.2*  MCV 90.7 91.9 92.7  PLT 148* 149* 121*   Cardiac Enzymes: Recent Labs    06/04/2018 1119 06/13/2018 2149 06/02/2018 2312   TROPONINI 0.03* 0.03* 0.03*   BNP: Invalid input(s): POCBNP D-Dimer: No results for input(s): DDIMER in the last 72 hours. Hemoglobin A1C: No results for input(s): HGBA1C in the last 72 hours. Fasting Lipid Panel: No results for input(s): CHOL, HDL, LDLCALC, TRIG, CHOLHDL, LDLDIRECT in the last 72 hours. Thyroid Function Tests: No results for input(s): TSH, T4TOTAL, T3FREE, THYROIDAB in the last 72 hours.  Invalid input(s): FREET3 Anemia Panel: No results for input(s): VITAMINB12, FOLATE, FERRITIN, TIBC, IRON, RETICCTPCT in the last 72 hours.  Ct Head Wo Contrast  Result Date: 06/04/2018 CLINICAL DATA:  Neck pain and scalp hematoma after unwitnessed fall. EXAM: CT HEAD WITHOUT CONTRAST CT CERVICAL SPINE WITHOUT CONTRAST TECHNIQUE: Multidetector CT imaging of the head and cervical spine was performed following the standard protocol without intravenous contrast. Multiplanar CT image reconstructions of the cervical spine were also generated. COMPARISON:  CT scan of May 14, 2018. FINDINGS: CT HEAD FINDINGS Brain: Mild diffuse cortical atrophy is noted. Mild chronic ischemic white matter disease is noted. Old lacunar infarction is noted in left basal ganglia. No midline shift is noted. Ventricular size is within normal limits. There is no evidence of hemorrhage or acute infarction. Stable appearance of destructive mass  seen in the left frontal skull region. Vascular: No hyperdense vessel or unexpected calcification. 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 This extends into the left lateral orbital wall and left zygomatic arch. No acute traumatic fracture is noted. Sinuses/Orbits: Left ethmoid sinusitis is noted. Other: Fluid is noted in right mastoid air cells. CT CERVICAL SPINE FINDINGS Alignment: Normal. Skull base and vertebrae: No acute fracture. No primary bone lesion or focal pathologic process. Soft tissues and spinal canal: No prevertebral fluid or  swelling. No visible canal hematoma. Disc levels: Moderate degenerative disc disease is noted at C4-5 and C6-7 with anterior osteophyte formation. Severe degenerative disc disease is noted at C5-6. Upper chest: Extensive amount of scarring and pleural thickening is noted in both lung apices. Other: Degenerative changes are seen involving posterior facet joints bilaterally. IMPRESSION: Mild diffuse cortical atrophy. Mild chronic ischemic white matter disease. 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 No other significant intracranial abnormality seen. Left ethmoid sinusitis. Multilevel degenerative disc disease. No acute abnormality seen in the cervical spine. Electronically Signed   By: Marijo Conception, M.D.   On: 06/04/2018 12:31   Ct Cervical Spine Wo Contrast  Result Date: 06/04/2018 CLINICAL DATA:  Neck pain and scalp hematoma after unwitnessed fall. EXAM: CT HEAD WITHOUT CONTRAST CT CERVICAL SPINE WITHOUT CONTRAST TECHNIQUE: Multidetector CT imaging of the head and cervical spine was performed following the standard protocol without intravenous contrast. Multiplanar CT image reconstructions of the cervical spine were also generated. COMPARISON:  CT scan of May 14, 2018. FINDINGS: CT HEAD FINDINGS Brain: Mild diffuse cortical atrophy is noted. Mild chronic ischemic white matter disease is noted. Old lacunar infarction is noted in left basal ganglia. No midline shift is noted. Ventricular size is within normal limits. There is no evidence of hemorrhage or acute infarction. Stable appearance of destructive mass seen in the left frontal skull region. Vascular: No hyperdense vessel or unexpected calcification. 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 This extends into the left lateral orbital wall and left zygomatic arch. No acute traumatic fracture is noted. Sinuses/Orbits: Left ethmoid sinusitis is noted. Other: Fluid is noted  in right mastoid air cells. CT CERVICAL SPINE FINDINGS Alignment: Normal. Skull base and vertebrae: No acute fracture. No primary bone lesion or focal pathologic process. Soft tissues and spinal canal: No prevertebral fluid or swelling. No visible canal hematoma. Disc levels: Moderate degenerative disc disease is noted at C4-5 and C6-7 with anterior osteophyte formation. Severe degenerative disc disease is noted at C5-6. Upper chest: Extensive amount of scarring and pleural thickening is noted in both lung apices. Other: Degenerative changes are seen involving posterior facet joints bilaterally. IMPRESSION: Mild diffuse cortical atrophy. Mild chronic ischemic white matter disease. 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 No other significant intracranial abnormality seen. Left ethmoid sinusitis. Multilevel degenerative disc disease. No acute abnormality seen in the cervical spine. Electronically Signed   By: Marijo Conception, M.D.   On: 06/04/2018 12:31   US Carotid Bilateral  Result Date: 06/16/2018 CLINICAL DATA:  85 year old male with a history of syncope EXAM: BILATERAL CAROTID DUPLEX ULTRASOUND TECHNIQUE: Pearline Cables scale imaging, color Doppler and duplex ultrasound were performed of bilateral carotid and vertebral arteries in the neck. COMPARISON:  No prior duplex FINDINGS: Criteria: Quantification of carotid stenosis is based on velocity parameters that correlate the residual internal carotid diameter with NASCET-based stenosis levels, using the  diameter of the distal internal carotid lumen as the denominator for stenosis measurement. The following velocity measurements were obtained: RIGHT ICA:  Systolic 94 cm/sec, Diastolic 23 cm/sec CCA:  80 cm/sec SYSTOLIC ICA/CCA RATIO:  1.2 ECA:  103 cm/sec LEFT ICA:  Systolic 409 cm/sec, Diastolic 33 cm/sec CCA:  811 cm/sec SYSTOLIC ICA/CCA RATIO:  0.8 ECA:  148 cm/sec Right Brachial SBP: Not acquired Left Brachial SBP: Not acquired  RIGHT CAROTID ARTERY: Significant calcifications of the right common carotid artery. Intermediate waveform maintained. Moderate heterogeneous and partially calcified plaque at the right carotid bifurcation. No significant lumen shadowing. Low resistance waveform of the right ICA. No significant tortuosity. RIGHT VERTEBRAL ARTERY: Antegrade flow with low resistance waveform. LEFT CAROTID ARTERY: Significant calcifications of the left common carotid artery. Intermediate waveform maintained. Moderate heterogeneous and partially calcified plaque at the left carotid bifurcation. No significant lumen shadowing. Low resistance waveform of the left ICA. No significant tortuosity. LEFT VERTEBRAL ARTERY:  Antegrade flow with low resistance waveform. IMPRESSION: Color duplex indicates advanced heterogeneous and calcified plaque, with no hemodynamically significant stenosis by duplex criteria in the extracranial cerebrovascular circulation. Signed, Dulcy Fanny. Dellia Nims, RPVI [facility] Radiology Electronically Signed   By: Corrie Mckusick D.O.   On: 06/21/2018 16:09   Dg Chest Portable 1 View  Result Date: 06/04/2018 CLINICAL DATA:  Unwitnessed fall today. EXAM: PORTABLE CHEST 1 VIEW COMPARISON:  Chest x-rays dated 03/05/2018 and 12/26/2017 and chest CT dated 03/05/2018 FINDINGS: Heart size and pulmonary vascularity are normal. Aortic atherosclerosis. The patient has severe diffuse findings of pulmonary fibrosis and bronchiectasis as demonstrated on the prior CT scan. There is new volume loss at the right lung base and there is increased peripheral density in the left upper lobe which may represent acute inflammation superimposed on the chronic lung disease. No acute bone abnormality. Multiple surgical clips in the left side of the neck. IMPRESSION: 1. Severe chronic lung disease with pulmonary fibrosis and bronchiectasis. 2. New volume loss at the right lung base. 3. New  peripheral density in the left upper lung zone which may represent acute inflammation/infection superimposed on chronic lung disease. 4.  Aortic Atherosclerosis (ICD10-I70.0). Electronically Signed   By: Lorriane Shire M.D.   On: 06/04/2018 12:22   Dg Hip Unilat With Pelvis 2-3 Views Left  Result Date: 05/26/2018 CLINICAL DATA:  Acute LEFT hip pain following fall. Initial encounter. EXAM: DG HIP (WITH OR WITHOUT PELVIS) 2-3V LEFT COMPARISON:  None. FINDINGS: There is no evidence of acute fracture, subluxation or dislocation. Mild degenerative changes in the hips noted. No suspicious focal bony lesions are identified. Vascular calcifications are identified. IMPRESSION: No acute bony abnormality. Electronically Signed   By: Margarette Canada M.D.   On: 06/06/2018 14:50     Echo LVEF 55-60%  TELEMETRY: atrial fibrillation, 70s  ASSESSMENT AND PLAN:  Active Problems:   Syncope and collapse    1. Possible recurrent syncope, sounds less like syncope, and more concerning for seizure like activity based on daughter's description. Telemetry has revealed rate controlled atrial fibrillation without heart block or other arrhythmias. Carotid ultrasound negative for hemodynamically significant stenosis.  2. COPD/pulmonary fibrosis on supplemental oxygen; chest xray on 2/11 revealed new peripheral density in the left upper lung zone which may represent acute inflammation/infection superimposed on chronic lung disease, as well as new volume loss at the right lung base 3. Paroxysmal atrial fibrillation, rate controlled on carvedilol. Poor candidate for chronic anticoagulation with history of GI bleed  and frequent falls 4. 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 No further cardiac diagnostics recommended at this time 2. Continue to monitor on telemetry 3. Recommend neurology consult  Sign off for now; call with any questions.  Clabe Seal,  PA-C 06/06/2018 8:19 AM

## 2018-06-06 NOTE — Evaluation (Signed)
Physical Therapy Evaluation Patient Details Name: Glenn Reeves MRN: [id number] DOB: [birth year] Today's Date: 06/06/2018   History of Present Illness  85 year old male referred for evaluation of syncope.  The patient has a history of paroxysmal atrial fibrillation, not on chronic anticoagulation due to GI bleed, pulmonary fibrosis, chronic respiratory failure, bradycardia, hypertension, type 2 diabetes, chronic kidney disease stage 3, no known history of coronary artery disease, history of carotid endarterectomy, and metastatic squamous cell carcinoma, with radiation therapy  Head CT revealed stable appearance of large destructive mass lesion     Clinical Impression  Patient in bed with family at bedside, oriented to self. During session pt demonstrated some confusion, behavior WFLs and able to follow commands. Pt and family reported that pt lives with his daughter and son in law in multilevel house, previously able to ambulate with RW and supervision for household distances, dress with supervision, assistance needed for bathing and dependent for IADLs. Family and pt also reported multiple falls recently.  Patient BP monitored throughout session, see vitals flowsheet, and RN notified throughout. Supine to sit with minAx1, able to sit EOB with supervision and verbal cues for feet support for several minutes without complaint, pt most comfortable with at least unilateral UE support as well. Sit <> stand with modAx1 and shuffling step to chair with RW and minAx1. Patient fatigued and mildly SOB at end of mobility, vitals WFLs with patient up in chair.  Overall the patient demonstrated deficits (see "PT Problem List") that impede the patient's functional abilities, safety, and mobility and would benefit from skilled PT intervention. Recommendation is STR due to acute decline in functional status, current level of assistance needed for mobility, and to maximize safety, mobility, and independence.     Follow Up Recommendations SNF    Equipment Recommendations  Other (comment)(TBD)    Recommendations for Other Services       Precautions / Restrictions Precautions Precautions: Fall Restrictions Weight Bearing Restrictions: No      Mobility  Bed Mobility Overal bed mobility: Needs Assistance Bed Mobility: Supine to Sit     Supine to sit: Min assist;HOB elevated     General bed mobility comments: for LE management and complete trunk elevation  Transfers Overall transfer level: Needs assistance Equipment used: Rolling walker (2 wheeled) Transfers: Sit to/from Stand Sit to Stand: Mod assist;From elevated surface            Ambulation/Gait Ambulation/Gait assistance: Min assist Gait Distance (Feet): 2 Feet Assistive device: Rolling walker (2 wheeled)       General Gait Details: shuffling step, cues for posture, minAx1 for RW management, CGA for safety.  Stairs            Wheelchair Mobility    Modified Rankin (Stroke Patients Only)       Balance Overall balance assessment: Needs assistance Sitting-balance support: Feet supported Sitting balance-Leahy Scale: Fair       Standing balance-Leahy Scale: Poor                               Pertinent Vitals/Pain Pain Assessment: Faces Faces Pain Scale: No hurt    Home Living Family/patient expects to be discharged to:: Private residence Living Arrangements: Children Available Help at Discharge: Family Type of Home: House Home Access: Stairs to enter Entrance Stairs-Rails: Right;Left;Can reach both Entrance Stairs-Number of Steps: 3 Home Layout: Multi-level;Able to live on main level with bedroom/bathroom Home Equipment: Gilford Rile - 2  wheels;Cane - single point;Wheelchair - [employer] - 4 wheels;Shower seat - built in      Prior Function Level of Independence: Needs assistance   Gait / Transfers Assistance Needed: Pt ambulates with RW and supervision at baseline with daughter  who is primary caregiver  ADL's / Homemaking Assistance Needed: dependent for IADLs, supervision for dressing, assistance needed for bathing        Hand Dominance        Extremity/Trunk Assessment   Upper Extremity Assessment Upper Extremity Assessment: Generalized weakness    Lower Extremity Assessment Lower Extremity Assessment: Generalized weakness       Communication   Communication: HOH  Cognition Arousal/Alertness: Awake/alert Behavior During Therapy: WFL for tasks assessed/performed Overall Cognitive Status: Impaired/Different from baseline                                 General Comments: Pt reported that patient is less confused than last night, but is still of baseline level of cognition      General Comments      Exercises     Assessment/Plan    PT Assessment Patient needs continued PT services  PT Problem List Decreased strength;Decreased range of motion;Decreased activity tolerance;Decreased knowledge of use of DME;Decreased balance;Decreased mobility;Decreased knowledge of precautions;Decreased safety awareness       PT Treatment Interventions DME instruction;Therapeutic exercise;Gait training;Balance training;Stair training;Neuromuscular re-education;Functional mobility training;Therapeutic activities;Patient/family education    PT Goals (Current goals can be found in the Care Plan section)  Acute Rehab PT Goals Patient Stated Goal: To return to PLOF PT Goal Formulation: With patient/family Time For Goal Achievement: 06/20/18 Potential to Achieve Goals: Good    Frequency Min 2X/week   Barriers to discharge        Co-evaluation               AM-PAC PT "6 Clicks" Mobility  Outcome Measure Help needed turning from your back to your side while in a flat bed without using bedrails?: A Little Help needed moving from lying on your back to sitting on the side of a flat bed without using bedrails?: A Little Help needed moving  to and from a bed to a chair (including a wheelchair)?: A Little Help needed standing up from a chair using your arms (e.g., wheelchair or bedside chair)?: A Lot Help needed to walk in hospital room?: A Lot Help needed climbing 3-5 steps with a railing? : Total 6 Click Score: 14    End of Session Equipment Utilized During Treatment: Gait belt Activity Tolerance: Patient limited by fatigue Patient left: with chair alarm set;in chair;with nursing/sitter in room;with family/visitor present;with call bell/phone within reach Nurse Communication: Mobility status PT Visit Diagnosis: Difficulty in walking, not elsewhere classified (R26.2);Other abnormalities of gait and mobility (R26.89);History of falling (Z91.81);Repeated falls (R29.6)    Time: 4540-9811 PT Time Calculation (min) (ACUTE ONLY): 47 min   Charges:   PT Evaluation $PT Eval Moderate Complexity: 1 Mod PT Treatments $Therapeutic Activity: 23-37 mins       Lieutenant Diego PT, DPT 10:46 AM,06/06/18 [phone]

## 2018-06-06 NOTE — Procedures (Signed)
History: 85 year old male being evaluated for syncope  Sedation: None  Technique: This is a 21 channel routine scalp EEG performed at the bedside with bipolar and monopolar montages arranged in accordance to the international 10/20 system of electrode placement. One channel was dedicated to EKG recording.    Background: The background is well organized with a posterior dominant rhythm of 7 to 8 Hz.  With drowsiness, there is an increase in slow activity, but sleep is not recorded.  Photic stimulation: Physiologic driving is present  EEG Abnormalities: 1) slow posterior dominant rhythm  Clinical Interpretation: This borderline EEG is potentially consistent with a mild encephalopathy as can be seen in dementing diseases, though this pattern can also be seen in the normal elderly.  There was no seizure or seizure predisposition recorded on this study. Please note that lack of epileptiform activity on EEG does not preclude the possibility of epilepsy.   Roland Rack, MD [facility] [phone]  If 7pm- 7am, please page neurology on call as listed in [place].

## 2018-06-06 NOTE — Progress Notes (Signed)
eeg completed ° °

## 2018-06-06 NOTE — Plan of Care (Signed)
Remains confused and impulsive with attempts to get out of bed.  Private sitter at bedside.  Reorients but forgetful. Low bed and safety floor pads in place.

## 2018-06-06 NOTE — Consult Note (Signed)
Referring Physician: Demetrios Loll, MD Reason for referral: Syncope and collapse Chief Complaint: Syncope and collapse  HPI: Glenn Reeves is an 85 y.o. male with past medical history of atrial fibrillation not on anticoagulation due to GI bleed, CAD, squamous cell carcinoma, diabetes mellitus, hypertension, pulmonary fibrosis on chronic oxygen, thyroid disease, COPD, and obstructive sleep apnea presenting to the ED with syncope and collapse.  Patient was previously seen in the ED for similar event on 06/06/2018, per patient's daughter who provide history and got up unassisted to go to the bathroom.  She reports that she had a loud third and went to check on him and found him on the floor with laceration to his forehead.  He was seen in the ED treated and discharged.  This morning patient was in the restroom with his daughter when he had a syncopal event following a bowel movement.  Daughter reports that patient stood up and when he went to sit back down due to labored breathing he suddenly leaned forward, became limp with fixed eyes.  Patient daughter helped him to the floor without sustaining any injuries. Patient daughter denies associated symptoms preceding the syncope of nausea and vomiting, diaphoresis, or loss of consciousness.  Denies abnormal jerking movements, loss of bladder control or tongue biting.   Initial labs unrevealing.  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  CT of the cervical spine also showed no acute abnormalities.  Work-up with ECG revealed atrial fibrillation with right bundle branch block and left anterior fascicular block.  Prior echocardiogram revealed normal ventricular function with LVEF 55 to 60%.  Patient was therefore admitted for further evaluation and management of syncopal episode  Past Medical History:  Diagnosis Date  . Atrial fibrillation ([place])   . CAD (coronary  artery disease)   . Cancer ([facility])    squamous cell - left ear  . Diabetes mellitus without complication ([place])   . GI bleed   . Hypertension   . Pulmonary fibrosis ([place])   . Thyroid disease     Past Surgical History:  Procedure Laterality Date  . ESOPHAGOGASTRODUODENOSCOPY (EGD) WITH PROPOFOL N/A 06/29/2016   Procedure: ESOPHAGOGASTRODUODENOSCOPY (EGD) WITH PROPOFOL;  Surgeon: [name], MD;  Location: [facility] ENDOSCOPY;  Service: Endoscopy;  Laterality: N/A;    Family History  Problem Relation Age of Onset  . Heart attack Mother   . Heart attack Father   . Heart disease Brother   . Stroke Brother   . Autoimmune disease Neg Hx    Social History:  reports that he quit smoking about 30 years ago. His smoking use included cigarettes. He has never used smokeless tobacco. He reports that he does not drink alcohol or use drugs.  Allergies:  Allergies  Allergen Reactions  . Codeine Rash    Medications:  I have reviewed the patient's current medications. Prior to Admission:  Medications Prior to Admission  Medication Sig Dispense Refill Last Dose  . carvedilol (COREG) 6.25 MG tablet Take 3.125 mg by mouth 2 (two) times daily.    Taking  . dronabinol (MARINOL) 5 MG capsule Take 1 capsule (5 mg total) by mouth 2 (two) times daily before a meal. 60 capsule 0 Taking  . finasteride (PROSCAR) 5 MG tablet Take 5 mg by mouth daily.   Taking  . HUMALOG KWIKPEN 100 UNIT/ML KiwkPen Inject 12 Units into the skin See admin instructions. Inject 12u under the skin with meals  as directed for blood sugar over 150   Taking  . ipratropium-albuterol (DUONEB) 0.5-2.5 (3) MG/3ML SOLN Inhale 3 mLs into the lungs every evening.    Taking  . LEVEMIR FLEXTOUCH 100 UNIT/ML Pen Inject 5 Units into the skin at bedtime.    Taking  . levothyroxine (SYNTHROID, LEVOTHROID) 75 MCG tablet Take 75 mcg by mouth daily.   Taking  . montelukast (SINGULAIR) 10 MG tablet Take 1 tablet by mouth daily.   Taking  . pantoprazole  (PROTONIX) 40 MG tablet Take 40 mg by mouth daily.   Taking  . polycarbophil (FIBERCON) 625 MG tablet Take 625 mg by mouth daily.    Taking  . rosuvastatin (CRESTOR) 20 MG tablet Take 5 mg by mouth every evening.    Taking  . tamsulosin (FLOMAX) 0.4 MG CAPS capsule Take 0.4 mg by mouth daily.    Taking   Scheduled: . bacitracin   Topical BID  . carvedilol  3.125 mg Oral BID  . dronabinol  5 mg Oral BID AC  . enoxaparin (LOVENOX) injection  40 mg Subcutaneous Q24H  . finasteride  5 mg Oral Daily  . insulin aspart  0-15 Units Subcutaneous TID WC  . insulin aspart  0-5 Units Subcutaneous QHS  . insulin detemir  5 Units Subcutaneous QHS  . ipratropium-albuterol  3 mL Inhalation QPM  . levothyroxine  75 mcg Oral Daily  . montelukast  10 mg Oral Daily  . pantoprazole  40 mg Oral Daily  . polycarbophil  625 mg Oral Daily  . rosuvastatin  5 mg Oral QPM  . sodium chloride flush  3 mL Intravenous Q12H  . tamsulosin  0.4 mg Oral Daily    ROS: Unable to obtain from patient due to very hard of hearing impairing communication.  Physical Examination: Blood pressure 103/64, pulse 77, temperature (!) 97.5 F (36.4 C), temperature source Oral, resp. rate 18, height 5\' 9"  (1.753 m), weight 78 kg, SpO2 96 %.   HEENT-  Normocephalic, facial bruising.  Normal external eye and conjunctiva.  Normal TM's bilaterally.  Normal auditory canals and external ears. Normal external nose, mucus membranes and septum.  Normal pharynx. Cardiovascular- S1, S2 normal, pulses palpable throughout   Lungs- chest clear, no wheezing, rales, normal symmetric air entry Abdomen- soft, non-tender; bowel sounds normal; no masses,  no organomegaly Extremities- no edema Lymph-no adenopathy palpable Musculoskeletal-no joint tenderness, deformity or swelling Skin-warm and dry, no hyperpigmentation, vitiligo, or suspicious lesions  Neurological Exam   Mental Status: Alert, oriented, thought content appropriate.  Speech fluent  without evidence of aphasia.  Able to follow 3 step commands without difficulty. Attention span and concentration seemed appropriate  Cranial Nerves: II: Discs flat bilaterally; Visual fields grossly normal, pupils equal, round, reactive to light and accommodation III,IV, VI: ptosis not present, extra-ocular motions intact bilaterally V,VII: smile symmetric, facial light touch sensation intact VIII: hearing normal bilaterally IX,X: gag reflex present XI: bilateral shoulder shrug XII: midline tongue extension Motor: Right :  Upper extremity   5/5 Without pronator drift      Left: Upper extremity   5/5 without pronator drift Right:   Lower extremity   5/5                                          Left: Lower extremity   5/5 Tone and bulk:normal tone throughout; no atrophy noted Sensory:  Pinprick and light touch intact bilaterally Deep Tendon Reflexes: 2+ and symmetric throughout Plantars: Right: mute                              Left: mute Cerebellar: Finger-to-nose testing intact bilaterally. Heel to shin testing normal bilaterally Gait: not tested due to safety concerns  Data Reviewed  Laboratory Studies:  Basic Metabolic Panel: Recent Labs  Lab 06/04/18 1119 06/04/2018 0833 06/06/18 0346  NA 141 140 144  K 4.7 4.4 4.8  CL 106 105 108  CO2 32 31 31  GLUCOSE 107* 130* 84  BUN 28* 28* 35*  CREATININE 1.30* 1.25* 1.36*  CALCIUM 8.4* 8.3* 8.3*    Liver Function Tests: Recent Labs  Lab 06/04/18 1119 06/11/2018 0833  AST 27 34  ALT 18 20  ALKPHOS 61 60  BILITOT 0.8 0.9  PROT 7.5 7.2  ALBUMIN 2.5* 2.4*   No results for input(s): LIPASE, AMYLASE in the last 168 hours. No results for input(s): AMMONIA in the last 168 hours.  CBC: Recent Labs  Lab 06/04/18 1119 06/18/2018 0833 06/06/18 0346  WBC 7.9 9.4 8.3  NEUTROABS 3.6 5.5  --   HGB 11.9* 11.1* 10.3*  HCT 39.1 36.2* 34.2*  MCV 90.7 91.9 92.7  PLT 148* 149* 121*    Cardiac Enzymes: Recent Labs  Lab  06/04/18 1410 06/08/2018 0833 06/08/2018 1119 06/01/2018 2149 05/26/2018 2312  TROPONINI <0.03 <0.03 0.03* 0.03* 0.03*    BNP: Invalid input(s): POCBNP  CBG: Recent Labs  Lab 06/06/2018 2101 06/06/18 0004 06/06/18 0502 06/06/18 0756 06/06/18 1129  GLUCAP 88 92 75 20 100*    Microbiology: Results for orders placed or performed during the hospital encounter of 06/19/2018  MRSA PCR Screening     Status: None   Collection Time: 05/29/2018  3:58 PM  Result Value Ref Range Status   MRSA by PCR NEGATIVE NEGATIVE Final    Comment:        The GeneXpert MRSA Assay (FDA approved for NASAL specimens only), is one component of a comprehensive MRSA colonization surveillance program. It is not intended to diagnose MRSA infection nor to guide or monitor treatment for MRSA infections. Performed at [facility], [address] [place], [place] [address]     Coagulation Studies: No results for input(s): LABPROT, INR in the last 72 hours.  Urinalysis: No results for input(s): COLORURINE, LABSPEC, PHURINE, GLUCOSEU, HGBUR, BILIRUBINUR, KETONESUR, PROTEINUR, UROBILINOGEN, NITRITE, LEUKOCYTESUR in the last 168 hours.  Invalid input(s): APPERANCEUR  Lipid Panel: No results found for: CHOL, TRIG, HDL, CHOLHDL, VLDL, LDLCALC  HgbA1C:  Lab Results  Component Value Date   HGBA1C 7.4 (H) 06/26/2016    Urine Drug Screen:  No results found for: LABOPIA, COCAINSCRNUR, LABBENZ, AMPHETMU, THCU, LABBARB  Alcohol Level: No results for input(s): ETH in the last 168 hours.  Other results: EKG: unchanged from previous tracings, atrial fibrillation, rate controlled, RBBB. Vent. rate 103 BPM PR interval * ms QRS duration 127 ms QT/QTc 381/499 ms P-R-T axes * -79 76  Imaging: Ct Head Wo Contrast  Result Date: 06/04/2018 CLINICAL DATA:  Neck pain and scalp hematoma after unwitnessed fall. EXAM: CT HEAD WITHOUT CONTRAST CT CERVICAL SPINE WITHOUT CONTRAST TECHNIQUE: Multidetector CT imaging  of the head and cervical spine was performed following the standard protocol without intravenous contrast. Multiplanar CT image reconstructions of the cervical spine were also generated. COMPARISON:  CT scan of May 14, 2018. FINDINGS:  CT HEAD FINDINGS Brain: Mild diffuse cortical atrophy is noted. Mild chronic ischemic white matter disease is noted. Old lacunar infarction is noted in left basal ganglia. No midline shift is noted. Ventricular size is within normal limits. There is no evidence of hemorrhage or acute infarction. Stable appearance of destructive mass seen in the left frontal skull region. Vascular: No hyperdense vessel or unexpected calcification. 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 This extends into the left lateral orbital wall and left zygomatic arch. No acute traumatic fracture is noted. Sinuses/Orbits: Left ethmoid sinusitis is noted. Other: Fluid is noted in right mastoid air cells. CT CERVICAL SPINE FINDINGS Alignment: Normal. Skull base and vertebrae: No acute fracture. No primary bone lesion or focal pathologic process. Soft tissues and spinal canal: No prevertebral fluid or swelling. No visible canal hematoma. Disc levels: Moderate degenerative disc disease is noted at C4-5 and C6-7 with anterior osteophyte formation. Severe degenerative disc disease is noted at C5-6. Upper chest: Extensive amount of scarring and pleural thickening is noted in both lung apices. Other: Degenerative changes are seen involving posterior facet joints bilaterally. IMPRESSION: Mild diffuse cortical atrophy. Mild chronic ischemic white matter disease. 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 No other significant intracranial abnormality seen. Left ethmoid sinusitis. Multilevel degenerative disc disease. No acute abnormality seen in the cervical spine. Electronically Signed   By: Marijo Conception, M.D.   On: 06/04/2018 12:31   Ct  Cervical Spine Wo Contrast  Result Date: 06/04/2018 CLINICAL DATA:  Neck pain and scalp hematoma after unwitnessed fall. EXAM: CT HEAD WITHOUT CONTRAST CT CERVICAL SPINE WITHOUT CONTRAST TECHNIQUE: Multidetector CT imaging of the head and cervical spine was performed following the standard protocol without intravenous contrast. Multiplanar CT image reconstructions of the cervical spine were also generated. COMPARISON:  CT scan of May 14, 2018. FINDINGS: CT HEAD FINDINGS Brain: Mild diffuse cortical atrophy is noted. Mild chronic ischemic white matter disease is noted. Old lacunar infarction is noted in left basal ganglia. No midline shift is noted. Ventricular size is within normal limits. There is no evidence of hemorrhage or acute infarction. Stable appearance of destructive mass seen in the left frontal skull region. Vascular: No hyperdense vessel or unexpected calcification. 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 This extends into the left lateral orbital wall and left zygomatic arch. No acute traumatic fracture is noted. Sinuses/Orbits: Left ethmoid sinusitis is noted. Other: Fluid is noted in right mastoid air cells. CT CERVICAL SPINE FINDINGS Alignment: Normal. Skull base and vertebrae: No acute fracture. No primary bone lesion or focal pathologic process. Soft tissues and spinal canal: No prevertebral fluid or swelling. No visible canal hematoma. Disc levels: Moderate degenerative disc disease is noted at C4-5 and C6-7 with anterior osteophyte formation. Severe degenerative disc disease is noted at C5-6. Upper chest: Extensive amount of scarring and pleural thickening is noted in both lung apices. Other: Degenerative changes are seen involving posterior facet joints bilaterally. IMPRESSION: Mild diffuse cortical atrophy. Mild chronic ischemic white matter disease. 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 No other  significant intracranial abnormality seen. Left ethmoid sinusitis. Multilevel degenerative disc disease. No acute abnormality seen in the cervical spine. Electronically Signed   By: Marijo Conception, M.D.   On: 06/04/2018 12:31   US Carotid Bilateral  Result Date: 06/20/2018 CLINICAL DATA:  85 year old male with a history of syncope EXAM: BILATERAL  CAROTID DUPLEX ULTRASOUND TECHNIQUE: Pearline Cables scale imaging, color Doppler and duplex ultrasound were performed of bilateral carotid and vertebral arteries in the neck. COMPARISON:  No prior duplex FINDINGS: Criteria: Quantification of carotid stenosis is based on velocity parameters that correlate the residual internal carotid diameter with NASCET-based stenosis levels, using the diameter of the distal internal carotid lumen as the denominator for stenosis measurement. The following velocity measurements were obtained: RIGHT ICA:  Systolic 94 cm/sec, Diastolic 23 cm/sec CCA:  80 cm/sec SYSTOLIC ICA/CCA RATIO:  1.2 ECA:  103 cm/sec LEFT ICA:  Systolic 809 cm/sec, Diastolic 33 cm/sec CCA:  983 cm/sec SYSTOLIC ICA/CCA RATIO:  0.8 ECA:  148 cm/sec Right Brachial SBP: Not acquired Left Brachial SBP: Not acquired RIGHT CAROTID ARTERY: Significant calcifications of the right common carotid artery. Intermediate waveform maintained. Moderate heterogeneous and partially calcified plaque at the right carotid bifurcation. No significant lumen shadowing. Low resistance waveform of the right ICA. No significant tortuosity. RIGHT VERTEBRAL ARTERY: Antegrade flow with low resistance waveform. LEFT CAROTID ARTERY: Significant calcifications of the left common carotid artery. Intermediate waveform maintained. Moderate heterogeneous and partially calcified plaque at the left carotid bifurcation. No significant lumen shadowing. Low resistance waveform of the left ICA. No significant tortuosity. LEFT VERTEBRAL ARTERY:  Antegrade flow with low resistance waveform. IMPRESSION: Color duplex  indicates advanced heterogeneous and calcified plaque, with no hemodynamically significant stenosis by duplex criteria in the extracranial cerebrovascular circulation. Signed, Dulcy Fanny. Dellia Nims, RPVI [facility] Electronically Signed   By: Corrie Mckusick D.O.   On: 05/25/2018 16:09   Dg Chest Portable 1 View  Result Date: 06/04/2018 CLINICAL DATA:  Unwitnessed fall today. EXAM: PORTABLE CHEST 1 VIEW COMPARISON:  Chest x-rays dated 03/05/2018 and 12/26/2017 and chest CT dated 03/05/2018 FINDINGS: Heart size and pulmonary vascularity are normal. Aortic atherosclerosis. The patient has severe diffuse findings of pulmonary fibrosis and bronchiectasis as demonstrated on the prior CT scan. There is new volume loss at the right lung base and there is increased peripheral density in the left upper lobe which may represent acute inflammation superimposed on the chronic lung disease. No acute bone abnormality. Multiple surgical clips in the left side of the neck. IMPRESSION: 1. Severe chronic lung disease with pulmonary fibrosis and bronchiectasis. 2. New volume loss at the right lung base. 3. New peripheral density in the left upper lung zone which may represent acute inflammation/infection superimposed on chronic lung disease. 4.  Aortic Atherosclerosis (ICD10-I70.0). Electronically Signed   By: Lorriane Shire M.D.   On: 06/04/2018 12:22   Dg Hip Unilat With Pelvis 2-3 Views Left  Result Date: 06/10/2018 CLINICAL DATA:  Acute LEFT hip pain following fall. Initial encounter. EXAM: DG HIP (WITH OR WITHOUT PELVIS) 2-3V LEFT COMPARISON:  None. FINDINGS: There is no evidence of acute fracture, subluxation or dislocation. Mild degenerative changes in the hips noted. No suspicious focal bony lesions are identified. Vascular calcifications are identified. IMPRESSION: No acute bony abnormality. Electronically Signed   By: Margarette Canada M.D.   On: 06/09/2018 14:50    Assessment: 85 y.o. male presenting to the ED today with recurrent episode of syncope and collapse. Patient was recently seen in the ED the previous day with syncope and collapse.  Unclear etiology of syncopal episode. we have considered neurologic causes of syncope such as ischemic event or vertebral basilar insufficiency however have low suspicion of stroke or large vessel occlusion in absence of focal neurologic deficit in which resolution would be  delayed. CT head show no acute intracranial abnormality. US carotid show no hemodynamically significant stenosis. Would also consider other causes such as orthostatic hypotension, drug induced syncope, arrhythmias, carotid sinus dysfunction,or other cardiac related disorder and possible seizure disorder even though have low suspicion for seizures.   Plan: 1. EEG  This patient was staffed with Dr. Irish Elders, Alease Frame who personally evaluated patient, reviewed documentation and agreed with assessment and plan of care as above.  Rufina Falco, DNP, FNP-BC Board certified Nurse Practitioner Neurology Department  06/06/2018, 11:36 AM

## 2018-06-06 NOTE — Progress Notes (Signed)
[place] at [place] NAME: Glenn Reeves    MR#:  [id number]  DATE OF BIRTH:  [birth year]  SUBJECTIVE:  CHIEF COMPLAINT:   Chief Complaint  Patient presents with  . Loss of Consciousness   The patient is confused.  Still has orthostatic hypotension. REVIEW OF SYSTEMS:  Review of Systems  Unable to perform ROS: Mental status change    DRUG ALLERGIES:   Allergies  Allergen Reactions  . Codeine Rash   VITALS:  Blood pressure 103/87, pulse 66, temperature (!) 97.5 F (36.4 C), temperature source Oral, resp. rate 18, height 5\' 9"  (1.753 m), weight 78 kg, SpO2 96 %. PHYSICAL EXAMINATION:  Physical Exam Constitutional:      General: He is not in acute distress. HENT:     Head:     Comments: Head Laceration , ecchymosis around right eye    Mouth/Throat:     Mouth: Mucous membranes are moist.  Eyes:     General: No scleral icterus.    Conjunctiva/sclera: Conjunctivae normal.     Pupils: Pupils are equal, round, and reactive to light.  Neck:     Musculoskeletal: Normal range of motion and neck supple.     Vascular: No JVD.     Trachea: No tracheal deviation.  Cardiovascular:     Rate and Rhythm: Normal rate and regular rhythm.     Heart sounds: Normal heart sounds. No murmur. No gallop.   Pulmonary:     Effort: Pulmonary effort is normal. No respiratory distress.     Breath sounds: Normal breath sounds. No wheezing or rales.  Abdominal:     General: Bowel sounds are normal. There is no distension.     Palpations: Abdomen is soft.     Tenderness: There is no abdominal tenderness. There is no rebound.  Musculoskeletal: Normal range of motion.        General: No tenderness.     Right lower leg: No edema.     Left lower leg: No edema.  Skin:    Findings: No erythema or rash.  Neurological:     Mental Status: He is alert.     Cranial Nerves: No cranial nerve deficit.  Psychiatric:     Comments: Confused.    LABORATORY  PANEL:  Male CBC Recent Labs  Lab 06/06/18 0346  WBC 8.3  HGB 10.3*  HCT 34.2*  PLT 121*   ------------------------------------------------------------------------------------------------------------------ Chemistries  Recent Labs  Lab 06/13/2018 0833 06/06/18 0346  NA 140 144  K 4.4 4.8  CL 105 108  CO2 31 31  GLUCOSE 130* 84  BUN 28* 35*  CREATININE 1.25* 1.36*  CALCIUM 8.3* 8.3*  AST 34  --   ALT 20  --   ALKPHOS 60  --   BILITOT 0.9  --    RADIOLOGY:  US Carotid Bilateral  Result Date: 06/18/2018 CLINICAL DATA:  85 year old male with a history of syncope EXAM: BILATERAL CAROTID DUPLEX ULTRASOUND TECHNIQUE: Pearline Cables scale imaging, color Doppler and duplex ultrasound were performed of bilateral carotid and vertebral arteries in the neck. COMPARISON:  No prior duplex FINDINGS: Criteria: Quantification of carotid stenosis is based on velocity parameters that correlate the residual internal carotid diameter with NASCET-based stenosis levels, using the diameter of the distal internal carotid lumen as the denominator for stenosis measurement. The following velocity measurements were obtained: RIGHT ICA:  Systolic 94 cm/sec, Diastolic 23 cm/sec CCA:  80 cm/sec SYSTOLIC ICA/CCA RATIO:  1.2 ECA:  103 cm/sec LEFT ICA:  Systolic 559 cm/sec, Diastolic 33 cm/sec CCA:  741 cm/sec SYSTOLIC ICA/CCA RATIO:  0.8 ECA:  148 cm/sec Right Brachial SBP: Not acquired Left Brachial SBP: Not acquired RIGHT CAROTID ARTERY: Significant calcifications of the right common carotid artery. Intermediate waveform maintained. Moderate heterogeneous and partially calcified plaque at the right carotid bifurcation. No significant lumen shadowing. Low resistance waveform of the right ICA. No significant tortuosity. RIGHT VERTEBRAL ARTERY: Antegrade flow with low resistance waveform. LEFT CAROTID ARTERY: Significant calcifications of the left common carotid artery. Intermediate waveform maintained. Moderate heterogeneous and  partially calcified plaque at the left carotid bifurcation. No significant lumen shadowing. Low resistance waveform of the left ICA. No significant tortuosity. LEFT VERTEBRAL ARTERY:  Antegrade flow with low resistance waveform. IMPRESSION: Color duplex indicates advanced heterogeneous and calcified plaque, with no hemodynamically significant stenosis by duplex criteria in the extracranial cerebrovascular circulation. Signed, Dulcy Fanny. Dellia Nims, RPVI [facility] Electronically Signed   By: Corrie Mckusick D.O.   On: 05/26/2018 16:09   ASSESSMENT AND PLAN:   85 year old elderly male patient with history of atrial fibrillation not on anticoagulation with history of GI bleed, squamous cell cancer of left ear, diabetes mellitus type 2, hypertension, pulmonary fibrosis, coronary disease presented to emergency room for passing out  -Syncope, possible due to dehydration and orthostatic hypotension. Echocardiogram done in November 2019 showed EF of 55 to 60% US carotid show no hemodynamically significant stenosis.  Cardiology consult suggested no cardiac work-up but suggested neuro consult. Per neuro consult, low suspicion of stroke or seizure.  Will get EEG.  Orthostatic hypotension.  Hold Coreg.  Continue IV fluid support. Acute renal failure on CKD stage III, due to dehydration.  Continue IV fluid support.  Confusion, possible due to above.  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 Management plans discussed with  the patient, his daughter and they are in agreement.  CODE STATUS: DNR  TOTAL TIME TAKING CARE OF THIS PATIENT: 32 minutes.   More than 50% of the time was spent in counseling/coordination of care: YES  POSSIBLE D/C IN 1-2 DAYS, DEPENDING ON CLINICAL CONDITION.   Demetrios Loll M.D on 06/06/2018 at 3:02 PM  Between 7am to 6pm - Pager - [phone]  After 6pm go to [email or web address] - [facility]

## 2018-06-06 NOTE — Progress Notes (Signed)
RN noted no output for this shift. Bladder scan performed and yields 107 ml. Pt encouraged to stand to urinate and produces 60ml in urinal. I will continue to assess.

## 2018-06-06 NOTE — Consult Note (Signed)
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 Additionally, I will add twice daily bacitracin ointment application to skin loss at bridge of nose sustained during fall (partial thickness). A pressure redistribution chair pad is provided for pressure redistribution while OOB in chair.  [place] nursing team will not follow, but will remain available to this patient, the nursing and medical teams.  Please re-consult if needed. Thanks, Maudie Flakes, MSN, RN, [place], Arther Abbott  Pager# [phone]

## 2018-06-07 LAB — GLUCOSE, CAPILLARY
Glucose-Capillary: 84 mg/dL (ref 70–99)
Glucose-Capillary: 80 mg/dL (ref 70–99)

## 2018-06-07 LAB — BASIC METABOLIC PANEL
Anion gap: 4 — ABNORMAL LOW (ref 5–15)
BUN: 38 mg/dL — ABNORMAL HIGH (ref 8–23)
CO2: 29 mmol/L (ref 22–32)
Calcium: 8.1 mg/dL — ABNORMAL LOW (ref 8.9–10.3)
Chloride: 109 mmol/L (ref 98–111)
Creatinine, Ser: 1.53 mg/dL — ABNORMAL HIGH (ref 0.61–1.24)
GFR calc Af Amer: 47 mL/min — ABNORMAL LOW (ref >60)
GFR calc non Af Amer: 41 mL/min — ABNORMAL LOW (ref >60)
Glucose, Bld: 100 mg/dL — ABNORMAL HIGH (ref 70–99)
Potassium: 4.6 mmol/L (ref 3.5–5.1)
Sodium: 142 mmol/L (ref 135–145)

## 2018-06-07 MED ORDER — LORAZEPAM BOLUS VIA INFUSION: 1.0000 mg | INTRAVENOUS | Status: DC | PRN

## 2018-06-07 MED ORDER — GLYCOPYRROLATE 0.2 MG/ML IJ SOLN
0.2000 mg | INTRAMUSCULAR | Status: DC | PRN
  Filled 2018-06-07 (×2): qty 1

## 2018-06-07 MED ORDER — MORPHINE SULFATE (PF) 2 MG/ML IV SOLN
2.0000 mg | INTRAVENOUS | Status: DC | PRN
  Administered 2018-06-07 (×2): 2 mg via INTRAVENOUS
  Filled 2018-06-07 (×2): qty 1

## 2018-06-07 MED ORDER — LORAZEPAM 2 MG/ML IJ SOLN
1.0000 mg | INTRAMUSCULAR | Status: DC | PRN
  Administered 2018-06-07 (×3): 1 mg via INTRAVENOUS
  Filled 2018-06-07 (×3): qty 1

## 2018-06-07 MED ORDER — HALOPERIDOL LACTATE 5 MG/ML IJ SOLN: 2.0000 mg | Freq: Four times a day (QID) | INTRAMUSCULAR | Status: DC | PRN

## 2018-06-07 MED ORDER — HALOPERIDOL LACTATE 5 MG/ML IJ SOLN
1.0000 mg | Freq: Four times a day (QID) | INTRAMUSCULAR | Status: DC | PRN
  Administered 2018-06-07: 1 mg via INTRAVENOUS
  Filled 2018-06-07: qty 1

## 2018-06-07 MED ORDER — IPRATROPIUM-ALBUTEROL 0.5-2.5 (3) MG/3ML IN SOLN
3.0000 mL | RESPIRATORY_TRACT | Status: DC | PRN
  Filled 2018-06-07: qty 3

## 2018-06-07 MED ORDER — GLYCOPYRROLATE 0.2 MG/ML IJ SOLN
0.2000 mg | INTRAMUSCULAR | Status: DC | PRN
  Filled 2018-06-07: qty 1

## 2018-06-07 MED ORDER — BISACODYL 10 MG RE SUPP: 10.0000 mg | Freq: Every day | RECTAL | Status: DC | PRN

## 2018-06-07 MED ORDER — GLYCOPYRROLATE 1 MG PO TABS
1.0000 mg | ORAL_TABLET | ORAL | Status: DC | PRN
  Filled 2018-06-07: qty 1

## 2018-06-23 NOTE — Progress Notes (Signed)
Notified [employer]. Pt not a candidate. Glenn Reeves, #[phone]

## 2018-06-23 NOTE — Care Management Important Message (Signed)
Copy of signed Medicare IM left with patient in room. 

## 2018-06-23 NOTE — Progress Notes (Signed)
As per Nurse, pt's daughter called- she wants limited code- Everything OK except Intubation or BIPAP. Need to discuss code status with pt and family again in day time tomorrow.

## 2018-06-23 NOTE — Clinical Social Work Note (Signed)
Clinical Social Work Assessment  Patient Details  Name: Glenn Reeves MRN: [id number] Date of Birth: November 21, 1930  Date of referral:  06/30/18               Reason for consult:  Facility Placement                Permission sought to share information with:  Facility Sport and exercise psychologist, Family Supports Permission granted to share information::  Yes, Verbal Permission Granted  Name::     [name] Daughter   [phone]   Agency::  SNF admissions  Relationship::     Contact Information:     Housing/Transportation Living arrangements for the past 2 months:  Single Family Home Source of Information:  Medical Team Patient Interpreter Needed:  None Criminal Activity/Legal Involvement Pertinent to Current Situation/Hospitalization:  No - Comment as needed Significant Relationships:  Adult Children Lives with:  Adult Children Do you feel safe going back to the place where you live?  No Need for family participation in patient care:  Yes (Comment)  Care giving concerns:  Patient's family were thinking of having patient go to SNF.   Social Worker assessment / plan: Patient is an 85 year old Male who is alert and oriented x1.  CSW completed assessment by reviewing patient's medical chart due to confusion.  Patients lives alone, patient has been declining, family are meeting with physician and palliative to discuss disposition.  CSW received referral that patient may be switching to comfort care, and may need hospice home depending on patient's progress.  Employment status:  Retired Forensic scientist:  Medicare PT Recommendations:  [place] / Referral to community resources:  [place]  Patient/Family's Response to care:  Patient's family are thinking about hospice facility placement per report from nurse.  Patient/Family's Understanding of and Emotional Response to Diagnosis, Current Treatment, and Prognosis:  Nurse reported that  family is aware of patient's poor prognosis.  Emotional Assessment Appearance:  Appears stated age Attitude/Demeanor/Rapport:    Affect (typically observed):  Appropriate, Stable Orientation:  Oriented to Self Alcohol / Substance use:  Not Applicable Psych involvement (Current and /or in the community):  No (Comment)  Discharge Needs  Concerns to be addressed:  Care Coordination, Lack of Support Readmission within the last 30 days:  No Current discharge risk:  Lack of support system Barriers to Discharge:  Continued Medical Work up   Anell Barr 2018-06-30, 1:39 PM

## 2018-06-23 NOTE — Discharge Summary (Signed)
   [place] at [address] NAME: Glenn Reeves    MR#:  [id number]  DATE OF BIRTH:  [birth year]  DATE OF ADMISSION:  06/21/2018   ADMITTING PHYSICIAN: Saundra Shelling, MD  DATE OF DISCHARGE: Jun 13, 2018 10:45 PM  PRIMARY CARE PHYSICIAN: Adin Hector, MD   ADMISSION DIAGNOSIS:  Syncope and collapse [R55] DISCHARGE DIAGNOSIS:  Active Problems:   Syncope and collapse  SECONDARY DIAGNOSIS:   Past Medical History:  Diagnosis Date  . Atrial fibrillation ([place])   . CAD (coronary artery disease)   . Cancer (HCC)    squamous cell - left ear  . Diabetes mellitus without complication ([place])   . GI bleed   . Hypertension   . Pulmonary fibrosis ([place])   . Thyroid disease    HOSPITAL COURSE:   [name]  is a 85 y.o. 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   -Syncope, possible due to dehydration and orthostatic hypotension. Echocardiogram done in November 2019 showed EF of 55 to 60% US carotid reportedno hemodynamically significant stenosis.  Cardiology consult suggested no cardiac work-up but suggested neuro consult. Per neuro consult, low suspicion of stroke or seizure.  Orthostatic hypotension.  Hold Coreg.  He was treated with IV fluid support. Acute renal failure on CKD stage III, due to dehydration.   Renal function worsened even on IV fluid support.  Confusion and agitation,  he was very confused and agitated, treated with Haldol prn.  -Chronic atrial fibrillation No anticoagulation secondary to history of GI bleed  -Gait instability  -Type 2 diabetes mellitus He ws on sliding scale coverage with insulin   -Squamous cell cancer of the left frontal  area  The patient's condition was declining rapidly, very poor prognosis. His daughter wanted comfort care for the patient.  The patient was put on comfort care and expired at 22:45 on June 13, 2018.   Demetrios Loll M.D on 06/08/2018 at 3:15 PM  Between 7am to 6pm - Pager - [phone]  After 6pm go to [email or web address] - password [email or web address] [facility]  [facility] [employer] [place] [facility]  Office  [phone]  CC: Primary care physician; Adin Hector, MD   Note: This dictation was prepared with Dragon dictation along with smaller phrase technology. Any transcriptional errors that result from this process are unintentional.

## 2018-06-23 NOTE — Progress Notes (Signed)
[place] at [place] NAME: Glenn Reeves    MR#:  [id number]  DATE OF BIRTH:  1930-08-19  SUBJECTIVE:  CHIEF COMPLAINT:   Chief Complaint  Patient presents with  . Loss of Consciousness   The patient is confused and agitated. His condition is declining. REVIEW OF SYSTEMS:  Review of Systems  Unable to perform ROS: Mental status change    DRUG ALLERGIES:   Allergies  Allergen Reactions  . Codeine Rash   VITALS:  Blood pressure 114/82, pulse (!) 108, temperature 98.4 F (36.9 C), temperature source Axillary, resp. rate (!) 22, height 5\' 9"  (1.753 m), weight 78.4 kg, SpO2 98 %. PHYSICAL EXAMINATION:  Physical Exam Constitutional:      General: He is not in acute distress. HENT:     Head:     Comments: Head Laceration , ecchymosis around right eye Eyes:     General: No scleral icterus. Neck:     Musculoskeletal: Neck supple.     Vascular: No JVD.     Trachea: No tracheal deviation.  Cardiovascular:     Rate and Rhythm: Normal rate and regular rhythm.     Heart sounds: Normal heart sounds. No murmur. No gallop.   Pulmonary:     Effort: No respiratory distress.     Breath sounds: No stridor. No wheezing or rales.  Abdominal:     General: Bowel sounds are normal. There is no distension.     Palpations: Abdomen is soft.     Tenderness: There is no abdominal tenderness. There is no rebound.  Musculoskeletal:        General: No tenderness.     Right lower leg: No edema.     Left lower leg: No edema.  Skin:    Findings: No erythema or rash.  Neurological:     Comments: Unable to exam.  Psychiatric:     Comments: Confused.    LABORATORY PANEL:  Male CBC Recent Labs  Lab 06/06/18 0346  WBC 8.3  HGB 10.3*  HCT 34.2*  PLT 121*   ------------------------------------------------------------------------------------------------------------------ Chemistries  Recent Labs  Lab 06/09/2018 0833  2018-07-02 0403  NA  140   < > 142  K 4.4   < > 4.6  CL 105   < > 109  CO2 31   < > 29  GLUCOSE 130*   < > 100*  BUN 28*   < > 38*  CREATININE 1.25*   < > 1.53*  CALCIUM 8.3*   < > 8.1*  AST 34  --   --   ALT 20  --   --   ALKPHOS 60  --   --   BILITOT 0.9  --   --    < > = values in this interval not displayed.   RADIOLOGY:  No results found. ASSESSMENT AND PLAN:   86 year old elderly male patient with history of atrial fibrillation not on anticoagulation with history of GI bleed, squamous cell cancer of left ear, diabetes mellitus type 2, hypertension, pulmonary fibrosis, coronary disease presented to emergency room for passing out  -Syncope, possible due to dehydration and orthostatic hypotension. Echocardiogram done in November 2019 showed EF of 55 to 60% US carotid show no hemodynamically significant stenosis.  Cardiology consult suggested no cardiac work-up but suggested neuro consult. Per neuro consult, low suspicion of stroke or seizure, EEG.  Orthostatic hypotension.  Hold Coreg.  He is treated with IV fluid support. Acute renal  failure on CKD stage III, due to dehydration.  Worsening even on IV fluid support.  Confusion and agitation, haldol prn.  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 His daughter wants comfort care for the patient.   All the records are reviewed and case discussed with Care Management/Social Worker. Management plans discussed with the patient, his daughter and they are in agreement.  CODE STATUS: DNR  TOTAL TIME TAKING CARE OF THIS PATIENT:22 minutes.   More than 50% of the time was spent in counseling/coordination of care: YES  POSSIBLE D/C  IN ? DAYS, DEPENDING ON CLINICAL CONDITION.   Demetrios Loll M.D on 2018/06/08 at 10:58 AM  Between 7am to 6pm - Pager - [phone]  After 6pm go to [email or web address] - [facility]

## 2018-06-23 NOTE — Clinical Social Work Note (Signed)
CSW received consult that patient may be need hospice facility placement.  CSW spoke to physician and it will depend on patient's progress, he may not survive hospital stay, CSW to continue to follow patient's progress throughout discharge planning.  Jones Broom. Norval Morton, MSW, [place]  June 28, 2018 4:44 PM

## 2018-06-23 NOTE — Progress Notes (Signed)
Advanced Care Plan.  Purpose of Encounter: palliative care/hospice care/comfort care. Parties in Attendance: the patient, his daughter and me. Patient's Decisional Capacity: No. Medical Story: Glenn Reeves  is a 85 y.o. male with a known history of atrial fibrillation not on anticoagulation secondary to GI bleed, coronary artery disease, squamous cell cancer with history of Mohs surgery, diabetes mellitus type 2, GI bleed, pulmonary fibrosis, thyroid disease Goals of Care Determinations: comfort care. He is admitted for syncope, orthostatic hypotension and ARF. His daughter change from DNR to limited code last night but his condition is declining and has very poor prognosis. I discussed with his daughter about his current worsening condition, palliative care/hospice care/comfort care. She does not want patient in suffer. She decided comfort care for the patient. Plan:  Comfort care. Code Status: DNR. Time spent discussing advance care planning: 20 minutes.

## 2018-06-23 NOTE — Progress Notes (Signed)
PT Cancellation Note  Patient Details Name: Glenn Reeves MRN: [id number] DOB: 1931-04-12   Cancelled Treatment:    Reason Eval/Treat Not Completed: Medical issues which prohibited therapy(Per chart review, patient/family deciding on transition to comfort care.  Will complete PT order at this time.  Please re-consult should needs and plan of care change.)   Anselmo Reihl H. Owens Shark, PT, DPT, NCS July 06, 2018, 12:09 PM [phone]

## 2018-06-23 NOTE — Progress Notes (Signed)
Call to pt's room, the daughter thinks that the father has past. In to see pt. Listen to heart, felt pulse, check respiratory for a minute. No pulse, no respirations, no heart beat.  Pt pronounced @ 2245

## 2018-06-23 DEATH — deceased

## 2018-11-25 ENCOUNTER — Ambulatory Visit: Payer: Medicare Other | Admitting: Radiation Oncology

## 2020-08-10 IMAGING — CT CT MAXILLOFACIAL W/O CM
4 of 6 series · 15 of 47 positions shown, 17 images · non-contrast
Comparison: Head CT 06/26/2016

CLINICAL DATA: Fall with head injury and altered mental status.

EXAM:
CT HEAD WITHOUT CONTRAST
CT MAXILLOFACIAL WITHOUT CONTRAST
TECHNIQUE: Multidetector CT imaging of the head and maxillofacial structures
were performed using the standard protocol without intravenous
contrast. Multiplanar CT image reconstructions of the maxillofacial
structures were also generated.

[Series 2: head wo · axial · 0.47mm/px · z∈[-149,-39]mm · 6 of 32 slices shown, 8 images]
[im 5/32  brain]
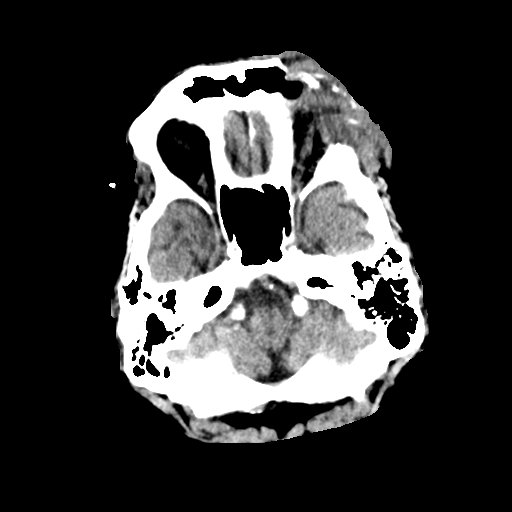
[im 5/32  bone]
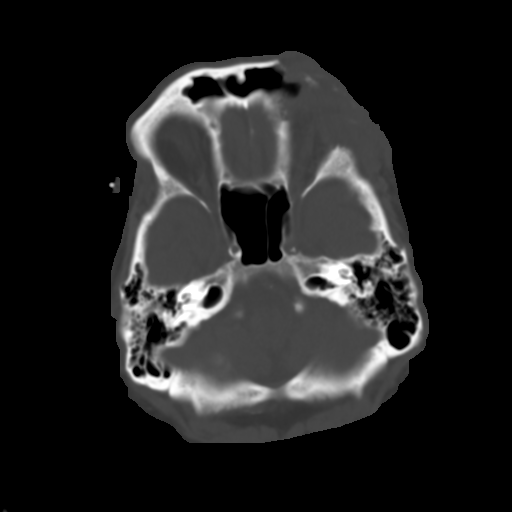
[im 9/32  bone]
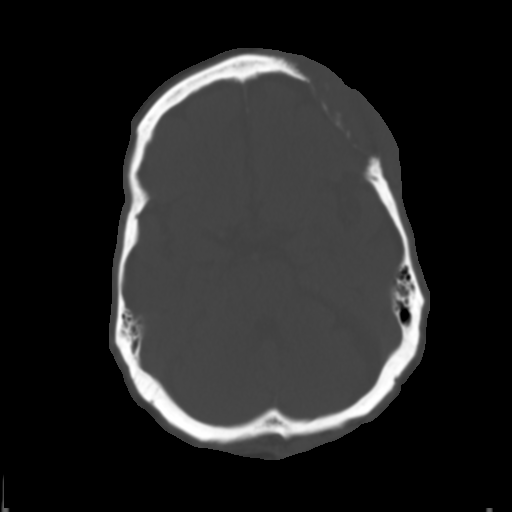
[im 14/32  bone]
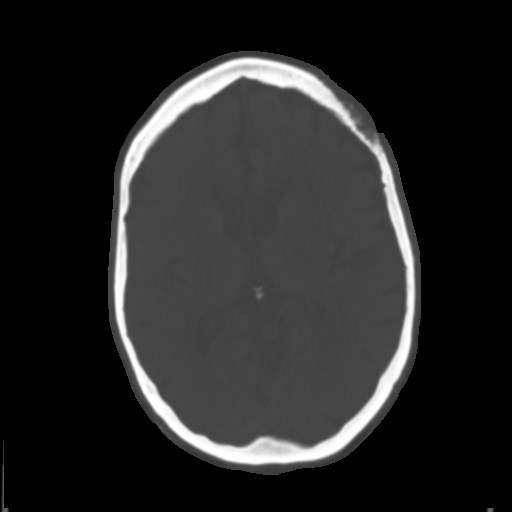
[im 18/32  bone]
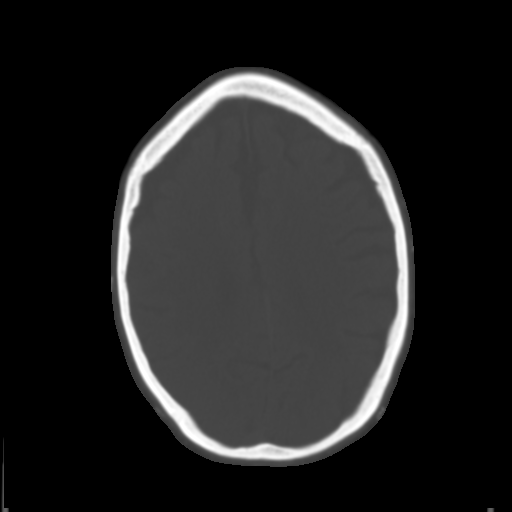
[im 23/32  brain]
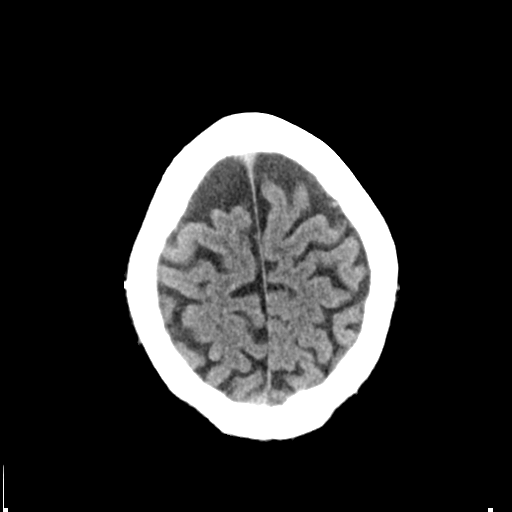
[im 23/32  bone]
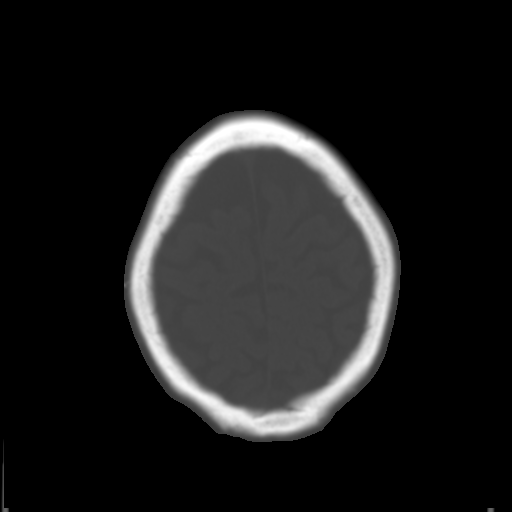
[im 27/32  bone]
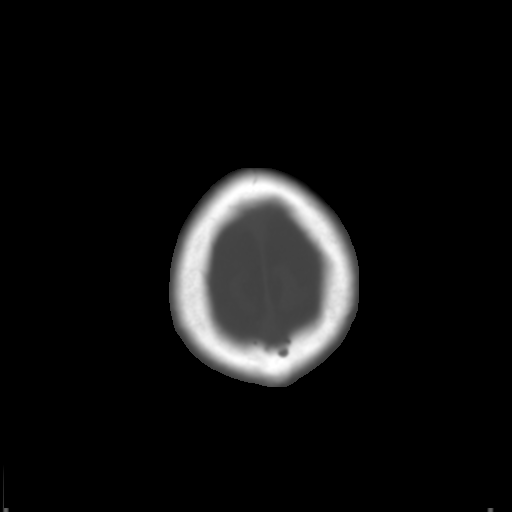

[Series 4: coronal soft tissue · coronal · 0.32mm/px · 3 of 67 slices shown]
[im 19/67  bone]
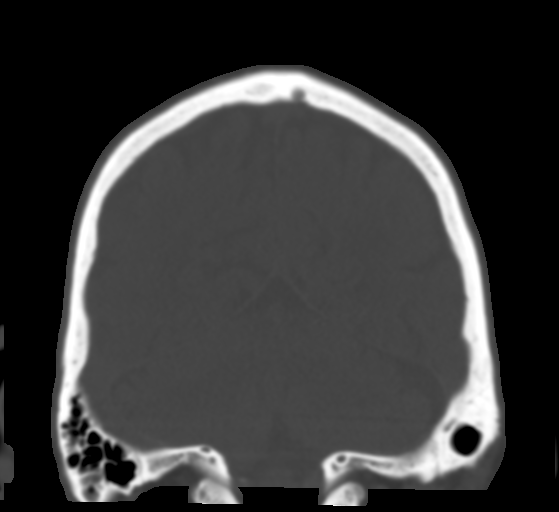
[im 29/67  bone]
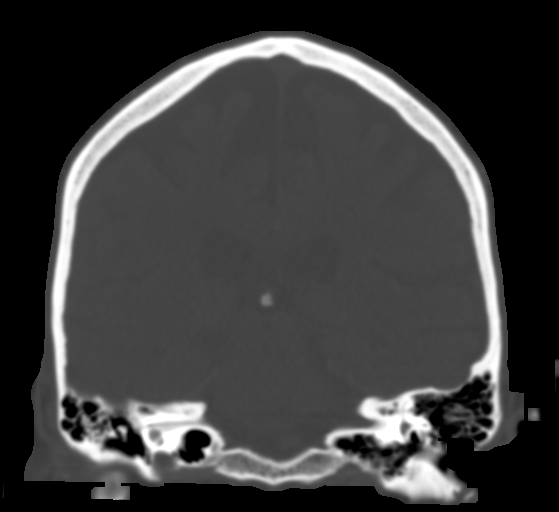
[im 38/67  bone]
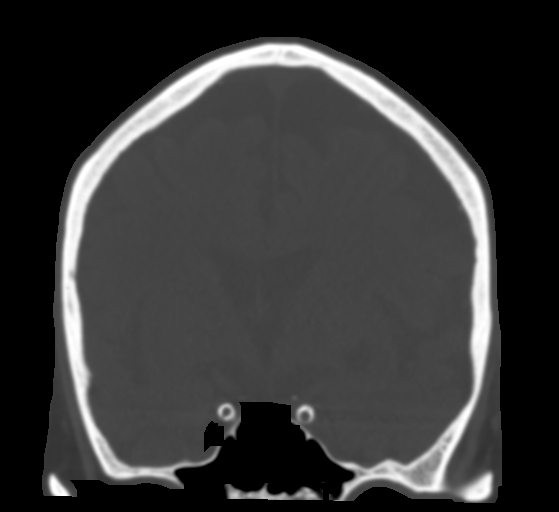

[Series 6: max soft · axial · 0.37mm/px · z∈[-276,-222]mm · 4 of 82 slices shown]
[im 8/82  brain]
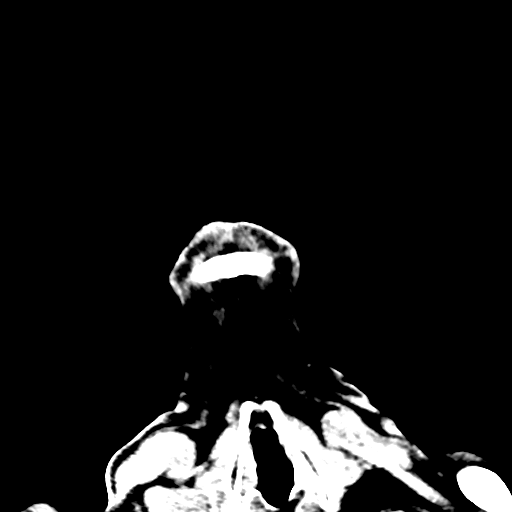
[im 16/82  brain]
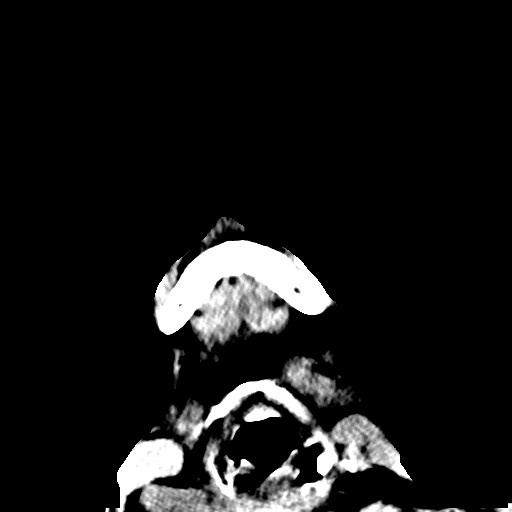
[im 28/82  brain]
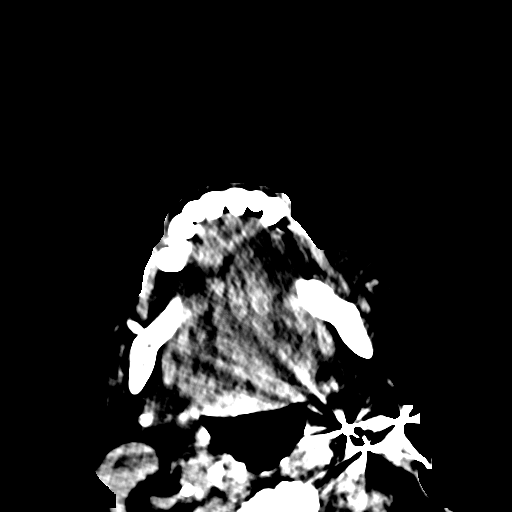
[im 35/82  brain]
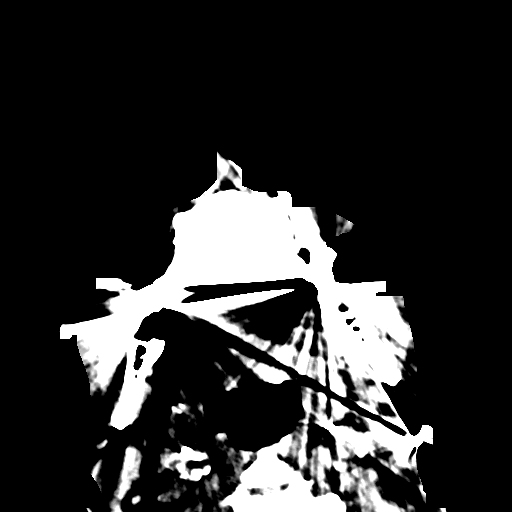

[Series 11: sagittal soft · sagittal · 0.29mm/px · 2 of 84 slices shown]
[im 28/84  bone]
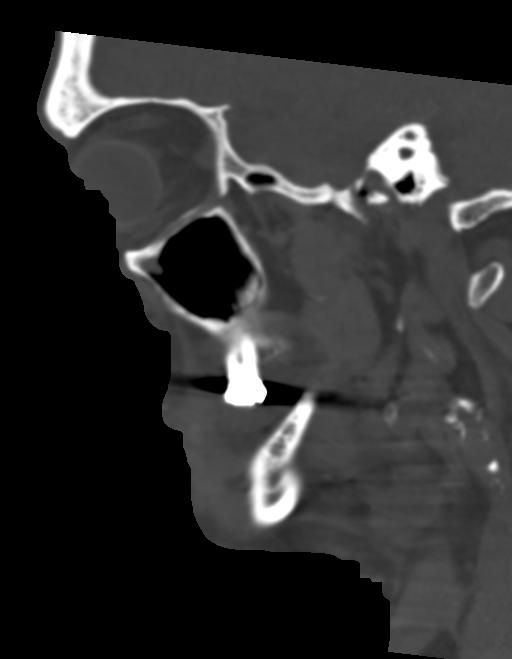
[im 56/84  bone]
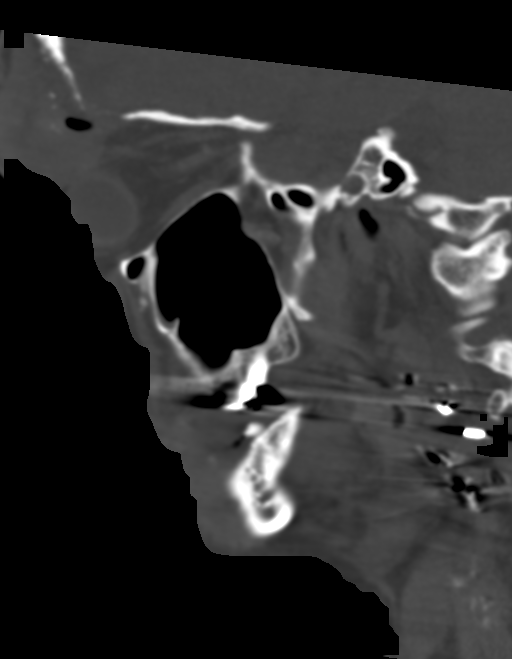

[15 of 47 positions shown; findings below may reference images not displayed]

FINDINGS: CT HEAD FINDINGS

Brain: There is no hemorrhage or intracranial mass effect. The size
and configuration of the ventricles and extra-axial CSF spaces are
normal. There is no acute or chronic infarction. There is
hypoattenuation of the periventricular white matter, most commonly
indicating chronic ischemic microangiopathy.

Vascular: No hyperdense vessel or unexpected vascular calcification.

Skull: There is extensive erosion of the left frontal calvarium,
extending through the left frontal sinus and left orbital roof and
lateral wall. The mass measures up to 8.8 x 3.5 cm. There is inward
bulging of the left frontal convexity dura, but no intracranial
extension is evident.

CT MAXILLOFACIAL FINDINGS

Osseous: No traumatic facial fractures identified. There is
extensive erosion of the left anterior calvarium that extends
through the left frontal sinus, anterior left lamina papyracea, left
orbital roof, left orbital lateral wall and left maxillary process.

Orbits: Despite the severe erosion of the roof and lateral wall of
the left orbit, the globes and intraconal structures are normal. The
left lacrimal gland is difficult to separate from the mass.
Extraocular muscles are intact. Normal optic nerves.

Sinuses: There is severe rotation of the lateral aspect of the left
frontal sinus. Otherwise, the paranasal sinuses and mastoid air
cells are clear.

Soft tissues: As above, large soft tissue component of mass at the
left frontal scalp, which measures up to 8.8 x 3.5 cm. At the
superior aspect of the mass, there is a central low attenuation
collection that measures approximately 2.5 cm.
IMPRESSION: 1. No acute traumatic intracranial abnormality or maxillofacial
fracture.
2. Large left frontal scalp and calvarium mass measuring up to 8.8 x
3.5 cm with extensive erosion of the left frontal calvarium, left
frontal sinus, left maxillary process and the superior and lateral
walls of the left orbit. The primary consideration is an invasive
skin carcinoma. Other possibilities include orbital lymphoma (though
this is unusually aggressive), a primary osseous tumor or metastasis
from an unknown primary cancer; but these are all less likely.
3. Central hypoattenuation within the mass is most likely necrotic
tumor. However, given the proximity of the dura, this could
represent a contained CSF leak. There is inward bulging of the left
frontal convexity dura, but no evidence of intradural extension of
tumor.
